# Patient Record
Sex: Female | Born: 1950
Health system: Southern US, Community
[De-identification: ages and names within clinical notes are randomized; demographics above are authoritative.]

## PROBLEM LIST (undated history)

## (undated) DIAGNOSIS — I10 Essential (primary) hypertension: Secondary | ICD-10-CM

## (undated) DIAGNOSIS — R7989 Other specified abnormal findings of blood chemistry: Secondary | ICD-10-CM

## (undated) DIAGNOSIS — F32A Depression, unspecified: Secondary | ICD-10-CM

## (undated) DIAGNOSIS — K589 Irritable bowel syndrome without diarrhea: Secondary | ICD-10-CM

## (undated) DIAGNOSIS — T8859XA Other complications of anesthesia, initial encounter: Secondary | ICD-10-CM

## (undated) DIAGNOSIS — K224 Dyskinesia of esophagus: Secondary | ICD-10-CM

## (undated) DIAGNOSIS — R945 Abnormal results of liver function studies: Secondary | ICD-10-CM

## (undated) DIAGNOSIS — M1990 Unspecified osteoarthritis, unspecified site: Secondary | ICD-10-CM

## (undated) DIAGNOSIS — R519 Headache, unspecified: Secondary | ICD-10-CM

## (undated) DIAGNOSIS — E785 Hyperlipidemia, unspecified: Secondary | ICD-10-CM

## (undated) DIAGNOSIS — F329 Major depressive disorder, single episode, unspecified: Secondary | ICD-10-CM

## (undated) DIAGNOSIS — R7303 Prediabetes: Secondary | ICD-10-CM

## (undated) DIAGNOSIS — R112 Nausea with vomiting, unspecified: Secondary | ICD-10-CM

## (undated) DIAGNOSIS — E781 Pure hyperglyceridemia: Secondary | ICD-10-CM

## (undated) DIAGNOSIS — C801 Malignant (primary) neoplasm, unspecified: Secondary | ICD-10-CM

## (undated) DIAGNOSIS — G479 Sleep disorder, unspecified: Secondary | ICD-10-CM

## (undated) DIAGNOSIS — Z9889 Other specified postprocedural states: Secondary | ICD-10-CM

## (undated) DIAGNOSIS — T4145XA Adverse effect of unspecified anesthetic, initial encounter: Secondary | ICD-10-CM

## (undated) DIAGNOSIS — R51 Headache: Secondary | ICD-10-CM

## (undated) DIAGNOSIS — K5792 Diverticulitis of intestine, part unspecified, without perforation or abscess without bleeding: Secondary | ICD-10-CM

## (undated) HISTORY — PX: SHOULDER SURGERY: SHX246

## (undated) HISTORY — PX: CARDIOVASCULAR STRESS TEST: SHX262

## (undated) HISTORY — PX: OTHER SURGICAL HISTORY: SHX169

## (undated) HISTORY — DX: Irritable bowel syndrome, unspecified: K58.9

## (undated) HISTORY — DX: Other specified abnormal findings of blood chemistry: R79.89

## (undated) HISTORY — DX: Sleep disorder, unspecified: G47.9

## (undated) HISTORY — DX: Pure hyperglyceridemia: E78.1

## (undated) HISTORY — DX: Hyperlipidemia, unspecified: E78.5

## (undated) HISTORY — PX: BREAST REDUCTION SURGERY: SHX8

## (undated) HISTORY — PX: TONSILLECTOMY: SUR1361

## (undated) HISTORY — DX: Abnormal results of liver function studies: R94.5

---

## 1995-08-14 HISTORY — PX: REDUCTION MAMMAPLASTY: SUR839

## 1998-08-18 ENCOUNTER — Other Ambulatory Visit: Admission: RE | Admit: 1998-08-18 | Discharge: 1998-08-18 | Payer: Self-pay | Admitting: Obstetrics and Gynecology

## 1999-08-01 ENCOUNTER — Encounter: Payer: Self-pay | Admitting: *Deleted

## 1999-08-01 ENCOUNTER — Encounter: Admission: RE | Admit: 1999-08-01 | Discharge: 1999-08-01 | Payer: Self-pay | Admitting: *Deleted

## 2000-02-21 ENCOUNTER — Encounter: Admission: RE | Admit: 2000-02-21 | Discharge: 2000-02-21 | Payer: Self-pay | Admitting: *Deleted

## 2000-02-21 ENCOUNTER — Encounter: Payer: Self-pay | Admitting: *Deleted

## 2000-10-07 ENCOUNTER — Encounter: Payer: Self-pay | Admitting: *Deleted

## 2000-10-07 ENCOUNTER — Encounter: Admission: RE | Admit: 2000-10-07 | Discharge: 2000-10-07 | Payer: Self-pay | Admitting: *Deleted

## 2000-10-10 ENCOUNTER — Encounter: Admission: RE | Admit: 2000-10-10 | Discharge: 2000-10-10 | Payer: Self-pay | Admitting: *Deleted

## 2000-10-10 ENCOUNTER — Encounter: Payer: Self-pay | Admitting: *Deleted

## 2001-05-20 ENCOUNTER — Other Ambulatory Visit: Admission: RE | Admit: 2001-05-20 | Discharge: 2001-05-20 | Payer: Self-pay | Admitting: Obstetrics and Gynecology

## 2001-12-17 ENCOUNTER — Ambulatory Visit (HOSPITAL_COMMUNITY): Admission: RE | Admit: 2001-12-17 | Discharge: 2001-12-17 | Payer: Self-pay | Admitting: Gastroenterology

## 2002-08-13 HISTORY — PX: KNEE ARTHROSCOPY: SHX127

## 2002-09-12 ENCOUNTER — Emergency Department (HOSPITAL_COMMUNITY): Admission: EM | Admit: 2002-09-12 | Discharge: 2002-09-12 | Payer: Self-pay | Admitting: *Deleted

## 2002-09-12 ENCOUNTER — Encounter: Payer: Self-pay | Admitting: Emergency Medicine

## 2002-10-16 ENCOUNTER — Emergency Department (HOSPITAL_COMMUNITY): Admission: EM | Admit: 2002-10-16 | Discharge: 2002-10-17 | Payer: Self-pay | Admitting: Emergency Medicine

## 2002-10-17 ENCOUNTER — Encounter: Payer: Self-pay | Admitting: Emergency Medicine

## 2002-12-04 ENCOUNTER — Encounter: Admission: RE | Admit: 2002-12-04 | Discharge: 2002-12-04 | Payer: Self-pay

## 2004-04-10 ENCOUNTER — Emergency Department (HOSPITAL_COMMUNITY): Admission: EM | Admit: 2004-04-10 | Discharge: 2004-04-10 | Payer: Self-pay | Admitting: *Deleted

## 2004-06-22 ENCOUNTER — Other Ambulatory Visit: Admission: RE | Admit: 2004-06-22 | Discharge: 2004-06-22 | Payer: Self-pay | Admitting: Obstetrics and Gynecology

## 2005-11-01 ENCOUNTER — Other Ambulatory Visit: Admission: RE | Admit: 2005-11-01 | Discharge: 2005-11-01 | Payer: Self-pay | Admitting: Obstetrics and Gynecology

## 2007-10-15 ENCOUNTER — Emergency Department (HOSPITAL_COMMUNITY): Admission: EM | Admit: 2007-10-15 | Discharge: 2007-10-15 | Payer: Self-pay | Admitting: Emergency Medicine

## 2007-11-24 ENCOUNTER — Ambulatory Visit (HOSPITAL_COMMUNITY): Admission: RE | Admit: 2007-11-24 | Discharge: 2007-11-24 | Payer: Self-pay | Admitting: Family Medicine

## 2010-09-03 ENCOUNTER — Encounter: Payer: Self-pay | Admitting: *Deleted

## 2010-09-03 ENCOUNTER — Encounter: Payer: Self-pay | Admitting: Gastroenterology

## 2010-12-18 ENCOUNTER — Emergency Department (HOSPITAL_COMMUNITY): Payer: BC Managed Care – PPO

## 2010-12-18 ENCOUNTER — Other Ambulatory Visit: Payer: Self-pay | Admitting: Gastroenterology

## 2010-12-18 ENCOUNTER — Inpatient Hospital Stay (HOSPITAL_COMMUNITY)
Admission: EM | Admit: 2010-12-18 | Discharge: 2010-12-20 | DRG: 813 | Disposition: A | Discharge status: Home / Self Care | Payer: BC Managed Care – PPO | Source: Ambulatory Visit | Attending: Internal Medicine | Admitting: Internal Medicine

## 2010-12-18 ENCOUNTER — Other Ambulatory Visit: Payer: Self-pay

## 2010-12-18 DIAGNOSIS — I10 Essential (primary) hypertension: Secondary | ICD-10-CM | POA: Diagnosis present

## 2010-12-18 DIAGNOSIS — K560 Paralytic ileus: Secondary | ICD-10-CM | POA: Diagnosis present

## 2010-12-18 DIAGNOSIS — K5289 Other specified noninfective gastroenteritis and colitis: Principal | ICD-10-CM | POA: Diagnosis present

## 2010-12-18 DIAGNOSIS — E860 Dehydration: Secondary | ICD-10-CM | POA: Diagnosis present

## 2010-12-18 DIAGNOSIS — K219 Gastro-esophageal reflux disease without esophagitis: Secondary | ICD-10-CM | POA: Diagnosis present

## 2010-12-18 DIAGNOSIS — E7800 Pure hypercholesterolemia, unspecified: Secondary | ICD-10-CM | POA: Diagnosis present

## 2010-12-18 LAB — DIFFERENTIAL
Basophils Absolute: 0 10*3/uL (ref 0.0–0.1)
Basophils Relative: 0 % (ref 0–1)
Eosinophils Absolute: 0 10*3/uL (ref 0.0–0.7)
Eosinophils Relative: 0 % (ref 0–5)
Lymphocytes Relative: 11 % — ABNORMAL LOW (ref 12–46)
Lymphs Abs: 1.2 10*3/uL (ref 0.7–4.0)
Monocytes Absolute: 0.3 10*3/uL (ref 0.1–1.0)
Monocytes Relative: 3 % (ref 3–12)
Neutro Abs: 8.8 10*3/uL — ABNORMAL HIGH (ref 1.7–7.7)
Neutrophils Relative %: 85 % — ABNORMAL HIGH (ref 43–77)

## 2010-12-18 LAB — BASIC METABOLIC PANEL
CO2: 23 mEq/L (ref 19–32)
Calcium: 9.2 mg/dL (ref 8.4–10.5)
Creatinine, Ser: 0.65 mg/dL (ref 0.4–1.2)
GFR calc Af Amer: >60 mL/min (ref >60)
GFR calc non Af Amer: >60 mL/min (ref >60)

## 2010-12-18 LAB — HEPATIC FUNCTION PANEL
Albumin: 4.2 g/dL (ref 3.5–5.2)
Total Bilirubin: 0.4 mg/dL (ref 0.3–1.2)
Total Protein: 7.7 g/dL (ref 6.0–8.3)

## 2010-12-18 LAB — CBC
MCH: 31.5 pg (ref 26.0–34.0)
MCHC: 35.6 g/dL (ref 30.0–36.0)
Platelets: 262 10*3/uL (ref 150–400)
RDW: 12.4 % (ref 11.5–15.5)

## 2010-12-18 LAB — LIPASE, BLOOD: Lipase: 28 U/L (ref 11–59)

## 2010-12-18 MED ORDER — IOHEXOL 300 MG/ML  SOLN
100.0000 mL | Freq: Once | INTRAMUSCULAR | Status: AC | PRN
  Administered 2010-12-18: 100 mL via INTRAVENOUS

## 2010-12-19 ENCOUNTER — Inpatient Hospital Stay (HOSPITAL_COMMUNITY): Payer: BC Managed Care – PPO

## 2010-12-19 LAB — DIFFERENTIAL
Basophils Relative: 0 % (ref 0–1)
Eosinophils Absolute: 0 10*3/uL (ref 0.0–0.7)
Lymphs Abs: 1.4 10*3/uL (ref 0.7–4.0)
Neutro Abs: 4.2 10*3/uL (ref 1.7–7.7)
Neutrophils Relative %: 69 % (ref 43–77)

## 2010-12-19 LAB — CBC
MCV: 88.4 fL (ref 78.0–100.0)
Platelets: 252 10*3/uL (ref 150–400)
RBC: 4.3 MIL/uL (ref 3.87–5.11)
WBC: 6.1 10*3/uL (ref 4.0–10.5)

## 2010-12-19 LAB — SEDIMENTATION RATE: Sed Rate: 22 mm/hr (ref 0–22)

## 2010-12-19 NOTE — H&P (Signed)
[name], KEAST              ACCOUNT NO.:  [id number]  MEDICAL RECORD NO.:  [id number]           PATIENT TYPE:  E  LOCATION:  [facility]                         FACILITY:  [facility]  PHYSICIAN:  Talmage Nap, MD  DATE OF BIRTH:  1951-06-24  DATE OF ADMISSION:  12/18/2010 DATE OF DISCHARGE:                             HISTORY & PHYSICAL   PRIMARY CARE PHYSICIAN:  Unknown.  PRIMARY GASTROENTEROLOGIST:  Shirley Friar, MD  SOURCES OF INFORMATION:  History obtainable from the patient and the patient's spouse.  CHIEF COMPLAINT:  Left lower quadrant abdominal pain of about 2 days' duration.  HISTORY OF PRESENT ILLNESS:  The patient is a 60 year old Caucasian female with a history of hypertriglyceridemia on gemfibrozil but has not taken medication for 2-3 days because of abdominal pain as well as GERD presenting to the emergency room with pain in the left lower quadrant of about 2 days' duration.  The patient claimed that she had been in stable health until about 2 days prior to presenting to the emergency room. She developed pain in the left lower quadrant which she describes as "colicky" 10/10 intensity and was nonradiating.  This was said to be associated with multiple episodes of nausea and the patient had one episode of vomiting.  She denied any diarrhea.  She denied any fever. She denied any chills.  She denied any rigor.  The pain was so intense that the patient was not able to eat and subsequently had to come to the emergency room.  She also denies any history of dysuria or hematuria.  PAST MEDICAL HISTORY: 1. Positive for hypertriglyceridemia, on gemfibrozil. 2. History of diverticulosis. 3. GERD. 4. Hypertension, presently not on any medications.  PAST SURGICAL HISTORY:  Cesarean section and breast reduction.  PREADMISSION MEDICATIONS:  Prilosec as well as gemfibrozil, dosage unknown.  ALLERGIES: 1. KEFLEX. 2. TETRACYCLINE.  SOCIAL HISTORY:  Negative for  alcohol or tobacco use.  FAMILY HISTORY:  Positive for colon CA.  REVIEW OF SYSTEMS:  The patient denies any history of headaches.  No blurred vision.  Complained about nausea, had one episodes of vomiting. Denies any chest pain.  No shortness of breath.  No cough.  No PND or orthopnea.  Complained about pain in the left lower quadrant with associated multiple episodes of nausea and had one episode of vomiting. She denies any diarrhea or hematochezia.  No dysuria or hematuria.  No swelling of the lower extremity.  No intolerance to heat or cold and no neuropsychiatric disorder.  PHYSICAL EXAMINATION:  GENERAL:  A middle-aged lady, severely dehydrated, not in any obvious respiratory distress. VITAL SIGNS:  Blood pressure is 138/72, pulse is 69, respiratory rate 20, and temperature is 90.1. HEENT:  Pupils are reactive to light and extraocular muscles are intact. NECK:  No jugular venous distention.  No carotid bruit.  No lymphadenopathy. CHEST:  Clear to auscultation. HEART:  Heart sounds are one and two. ABDOMEN:  Soft with tenderness in the left lower quadrant.  No guarding. No rigidity.  Liver, spleen, and kidney are nonpalpable.  Bowel sounds are hypoactive. EXTREMITIES:  No pedal edema. NEUROLOGIC:  Nonfocal.  MUSCULOSKELETAL:  Unremarkable. SKIN:  Markedly decreased turgor.  LABORATORY DATA:  Initial lipase was 28.  LFTs showed total protein 7.7, albumin is 4.2, AST 27, ALT 28, and alkaline phosphatase 70.  Chemistry showed sodium of 134, potassium of 4.4, chloride of 100 with a bicarb of 23, glucose is 120, BUN is 14, and creatinine is 0.65.  Hematological indices showed WBC of 10.4, hemoglobin of 15.8, hematocrit of 44.4, MCV of 88.4 with a platelet count of 262, neutrophils 85% elevated, and absolute granulocyte count is 8.8, elevated.  IMAGING STUDIES:  CT of the abdomen and pelvis with contrast which showed nonspecific small-bowel enteritis and ileus.  This is  nonspecific but may be infectious in etiology.  There is diverticulosis.  There is no radiographic evidence of diverticulitis.  There is hepatic steatosis, a small benign hepatic hemangioma noted, and small hiatal hernia.  ADMITTING IMPRESSION: 1. Left lower quadrant pain, most likely secondary to diverticulitis. 2. Dehydration. 3. Gastroesophageal reflux disease. 4. Hypertriglyceridemia.  PLAN:  Admit the patient to General Medical Floor.  The patient will be n.p.o. for now.  She will be adequately rehydrated with normal saline IV to go at rate of 150 mL an hour.  Pain control will be with Dilaudid 2 mg IV q.4 p.r.n.  She will also be on Cipro 400 mg IV q. 12., Flagyl 500 mg IV q.8 hourly.  GI prophylaxis with Protonix 40 mg IV q. 24 and DVT prophylaxis with TED stockings or SCD boots.  Further labs to be ordered on this patient will include blood culture x2 before starting IV antibiotics. CBC, CMP, and magnesium will be repeated in a.m.  I was informed by the nurse practitioner that the gastroenterologist, Dr. Dulce Sellar is very much aware of this patient.  The patient will be followed and evaluated on a day-to- day basis.     Talmage Nap, MD     CN/MEDQ  D:  12/18/2010  T:  12/18/2010  Job:  [id number]  Electronically Signed by Talmage Nap  on 12/19/2010 12:06:35 PM

## 2010-12-20 ENCOUNTER — Inpatient Hospital Stay (HOSPITAL_COMMUNITY): Payer: BC Managed Care – PPO

## 2010-12-20 LAB — COMPREHENSIVE METABOLIC PANEL
ALT: 25 U/L (ref 0–35)
AST: 25 U/L (ref 0–37)
Albumin: 3.3 g/dL — ABNORMAL LOW (ref 3.5–5.2)
Alkaline Phosphatase: 51 U/L (ref 39–117)
BUN: 11 mg/dL (ref 6–23)
CO2: 28 mEq/L (ref 19–32)
Calcium: 8.6 mg/dL (ref 8.4–10.5)
Chloride: 107 mEq/L (ref 96–112)
Creatinine, Ser: 0.8 mg/dL (ref 0.4–1.2)
GFR calc Af Amer: >60 mL/min (ref >60)
GFR calc non Af Amer: >60 mL/min (ref >60)
Glucose, Bld: 108 mg/dL — ABNORMAL HIGH (ref 70–99)
Potassium: 4.2 mEq/L (ref 3.5–5.1)
Sodium: 140 mEq/L (ref 135–145)
Total Bilirubin: 0.2 mg/dL — ABNORMAL LOW (ref 0.3–1.2)
Total Protein: 6.3 g/dL (ref 6.0–8.3)

## 2010-12-20 LAB — CBC
Hemoglobin: 12.5 g/dL (ref 12.0–15.0)
RBC: 4.14 MIL/uL (ref 3.87–5.11)

## 2010-12-22 NOTE — Consult Note (Signed)
[name], [name]              ACCOUNT NO.:  [id number]  MEDICAL RECORD NO.:  [id number]           PATIENT TYPE:  I  LOCATION:  5149                         FACILITY:  [facility]  PHYSICIAN:  Bernette Redbird, M.D.   DATE OF BIRTH:  11-Jul-1951  DATE OF CONSULTATION:  12/19/2010 DATE OF DISCHARGE:                                CONSULTATION   Dr. Beverly Gust of the [facility] asked [place] to see this 60 year old female because of severe abdominal pain.  The patient was admitted to the hospital yesterday following a 2-day prodrome of left lower quadrant pain which began Saturday evening and intensified and was associated with some degree of vomiting.  She is known to my partner, Dr. Vida Rigger, who has done several colonoscopies on her, most recently in 2008, showing diverticular disease.  The patient underwent a CT scan of the abdomen which did not show any evidence of diverticulitis but did show evidence of small bowel hyperemia and mesenteric edema, consistent with enteritis.  Since admission to the hospital, she has had waxing and waning abdominal pain.  She has gotten pretty good control with the pain medication although her pain has been of variable intensity since this problem began and, when I initially came by to see her for examination, she was intermittently smiling and alternately riving in abdominal pain and looking very much in distress.  When I came by later, she was reading the newspaper, sitting up in bed, in absolutely no distress with a smile on her face and indicating she had been able to consume and keep down with good tolerance, clear liquids.  PAST MEDICAL HISTORY:  Allergies:  To KEFLEX and TETRACYCLINE. Operations:  C-section. Medical Illnesses:  Diverticular disease, GERD, hypertriglyceridemia, for which she was started on gemfibrozil about 6 weeks ago, history of hypertension. Habits:  Nonsmoker, nondrinker.  FAMILY HISTORY:  Colon cancer in her  father in his 36s, otherwise negative for GI illnesses such as gallbladder trouble, liver disease, or inflammatory bowel disease.  SOCIAL HISTORY:  Married, husband at bedside.  REVIEW OF SYSTEMS:  See HPI.  Occasional heartburn, for which she uses Prevacid on pretty much a daily basis.  Occasional constipation for which she uses MiraLax p.r.n.  No anorexia, weight loss, dysphagia, chronic abdominal pain, rectal bleeding.  PHYSICAL EXAMINATION:  GENERAL:  A well-preserved female who is in intermittent distress. CHEST:  Clear. HEART:  Normal. HEENT:  No pallor or icterus. ABDOMEN:  Has active bowel sounds.  No bruits, no organomegaly, or guarding.  No masses or objective tenderness.  There is subjective but not really reproducible tenderness in the mid left lower abdominal region.  LABS:  White count on admission was 10,400 with 85 polys and 11 lymphs. On repeat, the white count dropped to 6100 with 69 polys and 23 lymphs, hemoglobin after hydration is 12.9 (on admission it was 15.8). Chemistry panel on admission unremarkable including normal liver chemistries and lipase.  CT scan, diverticulosis without radiographic evidence of diverticulitis. Small-bowel hyperenhancement with mesenteric edema, suggestive of enteritis, no bowel obstruction.  Plain abdominal films today reviewed, unrevealing.  IMPRESSION:  Nonspecific enteritis.  This  did occur after she ate out at a restaurant, eating scallops and beef in [employer].  However, I tend to doubt that this is food poisoning because of the relative absence of nausea and vomiting or diarrhea.  I doubt ischemia based on review of the CT scan with radiologist.  I imagine this will end up being some sort of nonspecific enteritis.  RECOMMENDATIONS:  For now, since the patient is clearly improving both in terms of her symptoms and her white count, I would favor observation with continued antibiotic therapy.  I do not think she  will need a full course of antibiotics as long as she has a prompt response.  We will see how her symptoms does before we advance her diet.  We appreciate the opportunity to have seen this patient in consultation with you.          ______________________________ Bernette Redbird, M.D.     RB/MEDQ  D:  12/19/2010  T:  12/20/2010  Job:  [id number]  cc:   Pam Drown, M.D.  Electronically Signed by Bernette Redbird M.D. on 12/22/2010 10:14:54 AM

## 2010-12-25 LAB — CULTURE, BLOOD (ROUTINE X 2): Culture  Setup Time: 201205080836

## 2010-12-27 NOTE — Discharge Summary (Signed)
[name], [name]              ACCOUNT NO.:  [id number]  MEDICAL RECORD NO.:  [id number]           PATIENT TYPE:  I  LOCATION:  5149                         FACILITY:  [facility]  PHYSICIAN:  Rock Nephew, MD       DATE OF BIRTH:  1950/12/01  DATE OF ADMISSION:  12/18/2010 DATE OF DISCHARGE:  12/20/2010                        DISCHARGE SUMMARY - REFERRING   PRIMARY CARE PHYSICIAN:  Pam Drown, MD  Dr. Matthias Hughs was consulting on the case.  DISCHARGE DIAGNOSES: 1. Enteritis. 2. Hypertension, presently off meds. 3. Hypertriglyceridemia. 4. Gastroesophageal reflux disease.  DISCHARGE MEDICATIONS:  For the patient are as follows; 1. Ciprofloxacin 500 mg twice daily 5 days. 2. Vicodin 1 tablet by mouth every 4 hours as needed for pain. 3. Metronidazole 5 mg by mouth three times a day. 4. Gemfibrozil 600 mg p.o. daily. 5. Glucosamine chondroitin 4 tablets by mouth daily. 6. Lunesta 2 mg 1 tablet by mouth daily at bedtime as needed. 7. Multivitamins 2 tablets by mouth daily. 8. Os-Cal 2 tablets by mouth daily. 9. Prevacid 15 mg by mouth daily. 10.Vitamin D3 50,000 units 1 capsule by mouth each week.  DISPOSITION:  The patient is discharged home.  The patient's diet should be heart-healthy, low fat.  PROCEDURES PERFORMED: 1. The patient had a CT scan of the abdomen pelvis on Dec 18, 2010     which showed nonspecific small bowel enteritis and ileus.  This is     nonspecific, but may be infectious in etiology. 2. Diverticulosis.  No radiographic evidence of diverticulitis. 3. Hepatic steatosis and small benign hepatic hematoma, hemangioma     noted. 4. Small hiatal hernia.  The patient also had an acute abdominal     series, the last one on Dec 19, 2010 showed nonspecific bowel gas     pattern, slightly prominent, upper abdominal small loop likely     related to enteritis on prior CT.  CONSULTATIONS ON THIS CASE:  Dr. Matthias Hughs, [facility].  DIET:  Again are heart healthy  low-fat.  FOLLOW-UP:  The patient should follow up with her primary care physician Dr. Gweneth Dimitri in about 1 week.  The patient should follow up with either Dr. Vida Rigger, Dr. Matthias Hughs or Dr. Charlott Rakes as needed.  BRIEF HISTORY OF PRESENT ILLNESS AND CHIEF COMPLAINT:  Left lower quadrant abdominal pain of 2 days duration.  This is a 60 year old female with history of multiple medical problems who came to the hospital for about 2 to 3 days of abdominal pain.  The patient came to the hospital, had a CT scan of the abdomen and pelvis, which showed enteritis.  HOSPITAL COURSE: 1. Enteritis and ileus.  The patient was admitted to the hospital.     The patient received IV antibiotics.  The patient was seen in     consultation with [facility].  The etiology was not clear.  The     patient was improving.  The patient's diet was slowly advanced.     The patient was able to tolerate a full diet on Dec 20, 2010 without     any difficulty and  the patient was deemed ready for discharge.  We     will treat the patient empirically for ciprofloxacin and Flagyl     empirically for another 5 days to complete a 7-day course.  The     patient will follow up with [facility] as needed.  However, if the     patient improves, the patient can have routine follow-up. 2. Hypertension.  The patient has a history of hypertension.  However,     she is off antihypertensives.  Her blood pressure has ranged from     147 systolic to 135 systolic, diastolic has ranged from 80 to 76. 3. Hypertriglyceridemia.  The patient takes gemfibrozil at home.  The     patient's gemfibrozil was withheld during the hospitalization,     which can be started again as an outpatient. 4. Gastroesophageal reflux disease.  The patient takes a PPI at home.     The patient was given a PPI during the hospitalization.     Rock Nephew, MD     NH/MEDQ  D:  12/20/2010  T:  12/20/2010  Job:  [id number]  cc:   Pam Drown,  M.D. Shirley Friar, MD Petra Kuba, M.D. Bernette Redbird, M.D.  Electronically Signed by Rock Nephew MD on 12/27/2010 05:45:23 PM

## 2010-12-29 NOTE — Op Note (Signed)
[facility]  Patient:    [name], [name] Visit Number: [id number] MRN: [id number]          Service Type: END Location: ENDO Attending Physician:  Nelda Marseille Dictated by:   Petra Kuba, M.D. Admit Date:  12/17/2001   CC:         Heather Roberts, M.D.  Debroah Loop, M.D.   Operative Report  PROCEDURE:  Colonoscopy  INDICATION:  Family history of colon cancer; due for repeat screening. Consent was signed after risks, benefits, methods, and options were thoroughly discussed in the office.  MEDICINES USED:  Demerol 90, Versed 9.  PROCEDURE:  Rectal inspection pertinent for _______internal hemorrhoids, small. Digital examination is negative. Pediatric video adjustable colonoscope was inserted and easily advanced around the colon to the cecum. No abdominal pressure or position changes were needed. No obvious abnormalities were seen on insertion, but some left-sided diverticula. The cecum was identified by the appendiceal orifice and the ileocecal valve. _____ was inserted ___ within the terminal ileum which was normal. Further documentation was obtained. The scope was slowly withdrawn. The prep was adequate. There was some liquid stool that required washing and suctioning. It was slowly withdrawn through the colon. No abnormalities were seen, but some diverticula in the sigmoid. There was some tortuosity and when we felt back around a loop, we did try to readvance her on the _________ of decreased chance of missing things. Scope was slowly withdrawn back to the rectum. No other abnormalities were seen. Once back in the rectum, the scope was retroflexed. It was pertinent for some internal hemorrhoids. Scope was turned and readvanced _______ up the left side of the colon. Air was suctioned and scope was removed. The patient tolerated the procedure well. There were no obvious immediate complications.  ENDOSCOPIC DIAGNOSES: 1.  Internal/external hemorrhoids. 2. Left-sided diverticula. 3. Tortuous colon. 4. Otherwise within normal limits to the terminal ileum.  PLAN:  I really want her to stop all herbs, vitamins, nutritional supplements, possibly except for her glucosamine for a month and recheck liver tests at that time. We have discussed that multiple times. We discussed it prior to her colonoscopy. She will continue to think about it. I would not want to do a liver biopsy just to prove this was medicine induced, although if liver tests stay abnormal and all other laboratory parameters do not reveal a ___ that is what she will need. As far as her colon goes, yearly rectals and guaiacs, per Dr. Orson Aloe. Have to see her back p.r.n. and otherwise repeat screening in 5 years. We will go ahead and get a set of liver tests today and 1 month after she stops her supplements to repeat. Dictated by:   Petra Kuba, M.D. Attending Physician:  Nelda Marseille DD:  12/17/01 TD:  12/19/01 Job: [id number]

## 2011-05-07 LAB — DIFFERENTIAL
Basophils Absolute: 0
Eosinophils Absolute: 0.1
Eosinophils Relative: 2
Lymphocytes Relative: 43
Lymphs Abs: 2.2
Monocytes Absolute: 0.3
Neutro Abs: 2.6
Neutrophils Relative %: 50

## 2011-05-07 LAB — CBC
MCHC: 35.6
RBC: 4.48
WBC: 5.2

## 2011-05-07 LAB — BASIC METABOLIC PANEL
CO2: 26
Calcium: 9.4
Creatinine, Ser: 0.93
GFR calc Af Amer: >60

## 2011-05-07 LAB — POCT CARDIAC MARKERS
CKMB, poc: <1 — ABNORMAL LOW
Operator id: 3067
Operator id: 4708
Troponin i, poc: <0.05
Troponin i, poc: <0.05

## 2013-09-17 ENCOUNTER — Emergency Department (HOSPITAL_COMMUNITY): Payer: No Typology Code available for payment source

## 2013-09-17 ENCOUNTER — Encounter (HOSPITAL_COMMUNITY): Payer: Self-pay | Admitting: Emergency Medicine

## 2013-09-17 ENCOUNTER — Emergency Department (HOSPITAL_COMMUNITY)
Admission: EM | Admit: 2013-09-17 | Discharge: 2013-09-17 | Disposition: A | Discharge status: Home / Self Care | Payer: No Typology Code available for payment source | Attending: Emergency Medicine | Admitting: Emergency Medicine

## 2013-09-17 DIAGNOSIS — M549 Dorsalgia, unspecified: Secondary | ICD-10-CM | POA: Insufficient documentation

## 2013-09-17 DIAGNOSIS — Z79899 Other long term (current) drug therapy: Secondary | ICD-10-CM | POA: Insufficient documentation

## 2013-09-17 DIAGNOSIS — R0789 Other chest pain: Secondary | ICD-10-CM | POA: Insufficient documentation

## 2013-09-17 DIAGNOSIS — I10 Essential (primary) hypertension: Secondary | ICD-10-CM | POA: Insufficient documentation

## 2013-09-17 HISTORY — DX: Essential (primary) hypertension: I10

## 2013-09-17 LAB — CBC
HEMATOCRIT: 44 % (ref 36.0–46.0)
HEMOGLOBIN: 15.7 g/dL — AB (ref 12.0–15.0)
MCH: 31.8 pg (ref 26.0–34.0)
MCHC: 35.7 g/dL (ref 30.0–36.0)
MCV: 89.1 fL (ref 78.0–100.0)
Platelets: 278 10*3/uL (ref 150–400)
RBC: 4.94 MIL/uL (ref 3.87–5.11)
RDW: 12.5 % (ref 11.5–15.5)
WBC: 5.9 10*3/uL (ref 4.0–10.5)

## 2013-09-17 LAB — POCT I-STAT TROPONIN I
TROPONIN I, POC: 0 ng/mL (ref 0.00–0.08)
Troponin i, poc: 0 ng/mL (ref 0.00–0.08)

## 2013-09-17 LAB — HEPATIC FUNCTION PANEL
ALBUMIN: 4 g/dL (ref 3.5–5.2)
ALT: 80 U/L — AB (ref 0–35)
AST: 46 U/L — AB (ref 0–37)
Alkaline Phosphatase: 113 U/L (ref 39–117)
Total Bilirubin: 0.4 mg/dL (ref 0.3–1.2)
Total Protein: 7.7 g/dL (ref 6.0–8.3)

## 2013-09-17 LAB — BASIC METABOLIC PANEL
BUN: 13 mg/dL (ref 6–23)
CHLORIDE: 102 meq/L (ref 96–112)
CO2: 26 meq/L (ref 19–32)
Calcium: 9.5 mg/dL (ref 8.4–10.5)
Creatinine, Ser: 0.99 mg/dL (ref 0.50–1.10)
GFR calc Af Amer: 68 mL/min — ABNORMAL LOW (ref >90)
GFR calc non Af Amer: 59 mL/min — ABNORMAL LOW (ref >90)
GLUCOSE: 134 mg/dL — AB (ref 70–99)
POTASSIUM: 4.3 meq/L (ref 3.7–5.3)
Sodium: 141 mEq/L (ref 137–147)

## 2013-09-17 LAB — D-DIMER, QUANTITATIVE (NOT AT ARMC): D DIMER QUANT: 0.57 ug{FEU}/mL — AB (ref 0.00–0.48)

## 2013-09-17 LAB — LIPASE, BLOOD: Lipase: 41 U/L (ref 11–59)

## 2013-09-17 MED ORDER — ASPIRIN 81 MG PO CHEW
324.0000 mg | CHEWABLE_TABLET | Freq: Once | ORAL | Status: AC
  Administered 2013-09-17: 324 mg via ORAL
  Filled 2013-09-17: qty 4

## 2013-09-17 MED ORDER — IOHEXOL 350 MG/ML SOLN
100.0000 mL | Freq: Once | INTRAVENOUS | Status: AC | PRN
  Administered 2013-09-17: 100 mL via INTRAVENOUS

## 2013-09-17 MED ORDER — NAPROXEN 500 MG PO TABS: 500.0000 mg | ORAL_TABLET | Freq: Two times a day (BID) | ORAL | Status: DC

## 2013-09-17 NOTE — Discharge Instructions (Signed)
Chest Pain (Nonspecific) Follow up with your doctor for management of your blood pressure and a stress test. Return to the ED if you develop new or worsening symptoms. It is often hard to give a specific diagnosis for the cause of chest pain. There is always a chance that your pain could be related to something serious, such as a heart attack or a blood clot in the lungs. You need to follow up with your caregiver for further evaluation. CAUSES   Heartburn.  Pneumonia or bronchitis.  Anxiety or stress.  Inflammation around your heart (pericarditis) or lung (pleuritis or pleurisy).  A blood clot in the lung.  A collapsed lung (pneumothorax). It can develop suddenly on its own (spontaneous pneumothorax) or from injury (trauma) to the chest.  Shingles infection (herpes zoster virus). The chest wall is composed of bones, muscles, and cartilage. Any of these can be the source of the pain.  The bones can be bruised by injury.  The muscles or cartilage can be strained by coughing or overwork.  The cartilage can be affected by inflammation and become sore (costochondritis). DIAGNOSIS  Lab tests or other studies, such as X-rays, electrocardiography, stress testing, or cardiac imaging, may be needed to find the cause of your pain.  TREATMENT   Treatment depends on what may be causing your chest pain. Treatment may include:  Acid blockers for heartburn.  Anti-inflammatory medicine.  Pain medicine for inflammatory conditions.  Antibiotics if an infection is present.  You may be advised to change lifestyle habits. This includes stopping smoking and avoiding alcohol, caffeine, and chocolate.  You may be advised to keep your head raised (elevated) when sleeping. This reduces the chance of acid going backward from your stomach into your esophagus.  Most of the time, nonspecific chest pain will improve within 2 to 3 days with rest and mild pain medicine. HOME CARE INSTRUCTIONS   If  antibiotics were prescribed, take your antibiotics as directed. Finish them even if you start to feel better.  For the next few days, avoid physical activities that bring on chest pain. Continue physical activities as directed.  Do not smoke.  Avoid drinking alcohol.  Only take over-the-counter or prescription medicine for pain, discomfort, or fever as directed by your caregiver.  Follow your caregiver's suggestions for further testing if your chest pain does not go away.  Keep any follow-up appointments you made. If you do not go to an appointment, you could develop lasting (chronic) problems with pain. If there is any problem keeping an appointment, you must call to reschedule. SEEK MEDICAL CARE IF:   You think you are having problems from the medicine you are taking. Read your medicine instructions carefully.  Your chest pain does not go away, even after treatment.  You develop a rash with blisters on your chest. SEEK IMMEDIATE MEDICAL CARE IF:   You have increased chest pain or pain that spreads to your arm, neck, jaw, back, or abdomen.  You develop shortness of breath, an increasing cough, or you are coughing up blood.  You have severe back or abdominal pain, feel nauseous, or vomit.  You develop severe weakness, fainting, or chills.  You have a fever. THIS IS AN EMERGENCY. Do not wait to see if the pain will go away. Get medical help at once. Call your local emergency services (911 in [place] Do not drive yourself to the hospital. MAKE SURE YOU:   Understand these instructions.  Will watch your condition.  Will get  help right away if you are not doing well or get worse. Document Released: 05/09/2005 Document Revised: 10/22/2011 Document Reviewed: 03/04/2008 [facility] Patient Information 2014 [place].  Arterial Hypertension Arterial hypertension (high blood pressure) is a condition of elevated pressure in your blood vessels. Hypertension over a long period of  time is a risk factor for strokes, heart attacks, and heart failure. It is also the leading cause of kidney (renal) failure.  CAUSES   In Adults -- Over 90% of all hypertension has no known cause. This is called essential or primary hypertension. In the other 10% of people with hypertension, the increase in blood pressure is caused by another disorder. This is called secondary hypertension. Important causes of secondary hypertension are:  Heavy alcohol use.  Obstructive sleep apnea.  Hyperaldosterosim (Conn's syndrome).  Steroid use.  Chronic kidney failure.  Hyperparathyroidism.  Medications.  Renal artery stenosis.  Pheochromocytoma.  Cushing's disease.  Coarctation of the aorta.  Scleroderma renal crisis.  Licorice (in excessive amounts).  Drugs (cocaine, methamphetamine). Your caregiver can explain any items above that apply to you.  In Children -- Secondary hypertension is more common and should always be considered.  Pregnancy -- Few women of childbearing age have high blood pressure. However, up to 10% of them develop hypertension of pregnancy. Generally, this will not harm the woman. It may be a sign of 3 complications of pregnancy: preeclampsia, HELLP syndrome, and eclampsia. Follow up and control with medication is necessary. SYMPTOMS   This condition normally does not produce any noticeable symptoms. It is usually found during a routine exam.  Malignant hypertension is a late problem of high blood pressure. It may have the following symptoms:  Headaches.  Blurred vision.  End-organ damage (this means your kidneys, heart, lungs, and other organs are being damaged).  Stressful situations can increase the blood pressure. If a person with normal blood pressure has their blood pressure go up while being seen by their caregiver, this is often termed "white coat hypertension." Its importance is not known. It may be related with eventually developing hypertension  or complications of hypertension.  Hypertension is often confused with mental tension, stress, and anxiety. DIAGNOSIS  The diagnosis is made by 3 separate blood pressure measurements. They are taken at least 1 week apart from each other. If there is organ damage from hypertension, the diagnosis may be made without repeat measurements. Hypertension is usually identified by having blood pressure readings:  Above 140/90 mmHg measured in both arms, at 3 separate times, over a couple weeks.  Over 130/80 mmHg should be considered a risk factor and may require treatment in patients with diabetes. Blood pressure readings over 120/80 mmHg are called "pre-hypertension" even in non-diabetic patients. To get a true blood pressure measurement, use the following guidelines. Be aware of the factors that can alter blood pressure readings.  Take measurements at least 1 hour after caffeine.  Take measurements 30 minutes after smoking and without any stress. This is another reason to quit smoking  it raises your blood pressure.  Use a proper cuff size. Ask your caregiver if you are not sure about your cuff size.  Most home blood pressure cuffs are automatic. They will measure systolic and diastolic pressures. The systolic pressure is the pressure reading at the start of sounds. Diastolic pressure is the pressure at which the sounds disappear. If you are elderly, measure pressures in multiple postures. Try sitting, lying or standing.  Sit at rest for a minimum of 5  minutes before taking measurements.  You should not be on any medications like decongestants. These are found in many cold medications.  Record your blood pressure readings and review them with your caregiver. If you have hypertension:  Your caregiver may do tests to be sure you do not have secondary hypertension (see "causes" above).  Your caregiver may also look for signs of metabolic syndrome. This is also called Syndrome X or Insulin  Resistance Syndrome. You may have this syndrome if you have type 2 diabetes, abdominal obesity, and abnormal blood lipids in addition to hypertension.  Your caregiver will take your medical and family history and perform a physical exam.  Diagnostic tests may include blood tests (for glucose, cholesterol, potassium, and kidney function), a urinalysis, or an EKG. Other tests may also be necessary depending on your condition. PREVENTION  There are important lifestyle issues that you can adopt to reduce your chance of developing hypertension:  Maintain a normal weight.  Limit the amount of salt (sodium) in your diet.  Exercise often.  Limit alcohol intake.  Get enough potassium in your diet. Discuss specific advice with your caregiver.  Follow a DASH diet (dietary approaches to stop hypertension). This diet is rich in fruits, vegetables, and low-fat dairy products, and avoids certain fats. PROGNOSIS  Essential hypertension cannot be cured. Lifestyle changes and medical treatment can lower blood pressure and reduce complications. The prognosis of secondary hypertension depends on the underlying cause. Many people whose hypertension is controlled with medicine or lifestyle changes can live a normal, healthy life.  RISKS AND COMPLICATIONS  While high blood pressure alone is not an illness, it often requires treatment due to its short- and long-term effects on many organs. Hypertension increases your risk for:  CVAs or strokes (cerebrovascular accident).  Heart failure due to chronically high blood pressure (hypertensive cardiomyopathy).  Heart attack (myocardial infarction).  Damage to the retina (hypertensive retinopathy).  Kidney failure (hypertensive nephropathy). Your caregiver can explain list items above that apply to you. Treatment of hypertension can significantly reduce the risk of complications. TREATMENT   For overweight patients, weight loss and regular exercise are  recommended. Physical fitness lowers blood pressure.  Mild hypertension is usually treated with diet and exercise. A diet rich in fruits and vegetables, fat-free dairy products, and foods low in fat and salt (sodium) can help lower blood pressure. Decreasing salt intake decreases blood pressure in a 1/3 of people.  Stop smoking if you are a smoker. The steps above are highly effective in reducing blood pressure. While these actions are easy to suggest, they are difficult to achieve. Most patients with moderate or severe hypertension end up requiring medications to bring their blood pressure down to a normal level. There are several classes of medications for treatment. Blood pressure pills (antihypertensives) will lower blood pressure by their different actions. Lowering the blood pressure by 10 mmHg may decrease the risk of complications by as much as 25%. The goal of treatment is effective blood pressure control. This will reduce your risk for complications. Your caregiver will help you determine the best treatment for you according to your lifestyle. What is excellent treatment for one person, may not be for you. HOME CARE INSTRUCTIONS   Do not smoke.  Follow the lifestyle changes outlined in the "Prevention" section.  If you are on medications, follow the directions carefully. Blood pressure medications must be taken as prescribed. Skipping doses reduces their benefit. It also puts you at risk for problems.  Follow  up with your caregiver, as directed.  If you are asked to monitor your blood pressure at home, follow the guidelines in the "Diagnosis" section above. SEEK MEDICAL CARE IF:   You think you are having medication side effects.  You have recurrent headaches or lightheadedness.  You have swelling in your ankles.  You have trouble with your vision. SEEK IMMEDIATE MEDICAL CARE IF:   You have sudden onset of chest pain or pressure, difficulty breathing, or other symptoms of a  heart attack.  You have a severe headache.  You have symptoms of a stroke (such as sudden weakness, difficulty speaking, difficulty walking). MAKE SURE YOU:   Understand these instructions.  Will watch your condition.  Will get help right away if you are not doing well or get worse. Document Released: 07/30/2005 Document Revised: 10/22/2011 Document Reviewed: 02/27/2007 [facility] Patient Information 2014 [place].

## 2013-09-17 NOTE — ED Notes (Signed)
Patient transported to CT 

## 2013-09-17 NOTE — ED Notes (Signed)
Called CT to inform pt ready for scan. Was told pt is in the que.

## 2013-09-17 NOTE — ED Provider Notes (Signed)
CSN: [id number]     Arrival date & time 09/17/13  1013 History   First MD Initiated Contact with Patient 09/17/13 1109     Chief Complaint  Patient presents with  . Chest Pain   (Consider location/radiation/quality/duration/timing/severity/associated sxs/prior Treatment) HPI Comments: Patient complains of pain in the left side of her chest it radiates to her back her arm and her shoulder has been ongoing for the past 3 months. It comes and goes lasting several hours at a time. It is worse at night. It is worse with arm movement. She denies any shortness of breath, nausea, dizziness or syncope. She denies any cardiac history. Last stress test was many years ago. She has a history of hypertension but is not take medications secondary to side effects. He describes the pain as a sharp ache a week's her up at night it is worse with position change and arm movement. It is not exertional.  The history is provided by the patient.    Past Medical History  Diagnosis Date  . Hypertension    History reviewed. No pertinent past surgical history. History reviewed. No pertinent family history. History  Substance Use Topics  . Smoking status: Never Smoker   . Smokeless tobacco: Not on file  . Alcohol Use: Not on file   OB History   Grav Para Term Preterm Abortions TAB SAB Ect Mult Living                 Review of Systems  Constitutional: Negative for fever, activity change and appetite change.  Respiratory: Positive for chest tightness. Negative for shortness of breath.   Cardiovascular: Positive for chest pain.  Gastrointestinal: Negative for nausea, vomiting and abdominal pain.  Genitourinary: Negative for dysuria, vaginal bleeding and vaginal discharge.  Musculoskeletal: Positive for back pain. Negative for arthralgias and myalgias.  Skin: Negative for rash.  Neurological: Negative for dizziness and headaches.  A complete 10 system review of systems was obtained and all systems are negative  except as noted in the HPI and PMH.    Allergies  Glycerine; Keflex; and Tetracyclines & related  Home Medications   Current Outpatient Rx  Name  Route  Sig  Dispense  Refill  . Cholecalciferol (VITAMIN D PO)   Oral   Take by mouth daily. 1 dropperful         . Misc Natural Products (GLUCOS-CHONDROIT-MSM COMPLEX PO)   Oral   Take 1 tablet by mouth daily.         . Multiple Vitamin (MULTIVITAMIN WITH MINERALS) TABS tablet   Oral   Take 1 tablet by mouth daily.         . Probiotic Product (PROBIOTIC DAILY PO)   Oral   Take 1 tablet by mouth daily.         Marland Kitchen zolpidem (AMBIEN) 10 MG tablet   Oral   Take 10 mg by mouth at bedtime as needed for sleep.         . naproxen (NAPROSYN) 500 MG tablet   Oral   Take 1 tablet (500 mg total) by mouth 2 (two) times daily.   30 tablet   0    BP 161/87  Pulse 66  Resp 18  SpO2 99% Physical Exam  Constitutional: She is oriented to person, place, and time. She appears well-developed and well-nourished. No distress.  HENT:  Head: Normocephalic and atraumatic.  Mouth/Throat: Oropharynx is clear and moist. No oropharyngeal exudate.  Eyes: Conjunctivae and EOM are normal.  Pupils are equal, round, and reactive to light.  Neck: Normal range of motion. Neck supple.  Cardiovascular: Normal rate, regular rhythm, normal heart sounds and intact distal pulses.   No murmur heard. Equal radial pulses, equal grip strengths  Pulmonary/Chest: Effort normal and breath sounds normal. No respiratory distress. She exhibits tenderness.  Left chest wall and anterior shoulder tenderness, worse with arm movement.  Abdominal: Soft. There is no tenderness. There is no rebound and no guarding.  Musculoskeletal: Normal range of motion. She exhibits no edema and no tenderness.  Neurological: She is alert and oriented to person, place, and time. No cranial nerve deficit. She exhibits normal muscle tone. Coordination normal.  Skin: Skin is warm.     ED Course  Procedures (including critical care time) Labs Review Labs Reviewed  BASIC METABOLIC PANEL - Abnormal; Notable for the following:    Glucose, Bld 134 (*)    GFR calc non Af Amer 59 (*)    GFR calc Af Amer 68 (*)    All other components within normal limits  CBC - Abnormal; Notable for the following:    Hemoglobin 15.7 (*)    All other components within normal limits  HEPATIC FUNCTION PANEL - Abnormal; Notable for the following:    AST 46 (*)    ALT 80 (*)    All other components within normal limits  D-DIMER, QUANTITATIVE - Abnormal; Notable for the following:    D-Dimer, Quant 0.57 (*)    All other components within normal limits  LIPASE, BLOOD  POCT I-STAT TROPONIN I  POCT I-STAT TROPONIN I   Imaging Review Dg Chest 2 View  09/17/2013   CLINICAL DATA:  Left-sided chest pain radiating into the left shoulder.  EXAM: CHEST  2 VIEW  COMPARISON:  10/16/2011  FINDINGS: Cardiac silhouette is normal in size and configuration. There is a small hiatal hernia mediastinum is otherwise normal in contour. Normal hilar contours.  2 small vague nodular densities project in the upper lobes on the frontal view not evident on the lateral view. These are not seen previously. The lungs are otherwise clear. No pleural effusion or pneumothorax.  The bony thorax is intact.  IMPRESSION: No acute cardiopulmonary disease.  Small nodular densities project in the upper lobes bilaterally on the frontal view only. Recommend followup chest radiographs and 3-4 months for reassessment. Alternatively, chest CT could be performed.   Electronically Signed   By: Lajean Manes M.D.   On: 09/17/2013 12:17   Ct Angio Chest Aortic Dissect W &/or W/o  09/17/2013   CLINICAL DATA:  Chest pain  EXAM: CT ANGIOGRAPHY CHEST WITH CONTRAST  TECHNIQUE: Multidetector CT imaging of the chest was performed using the standard protocol during bolus administration of intravenous contrast. Multiplanar CT image reconstructions  including MIPs were obtained to evaluate the vascular anatomy.  CONTRAST:  130mL OMNIPAQUE IOHEXOL 350 MG/ML SOLN  COMPARISON:  None.  FINDINGS: Noncontrast images demonstrate no evidence of intramural hematoma. Minimal peripheral aortic valvular calcifications. Moderate size hiatal hernia is noted. No evidence of coronary artery calcification.  No evidence of aortic dissection, transection, or aneurysm. Maximal diameter of the ascending aorta is 3.6 cm.  Innominate artery, right subclavian artery, right common carotid artery, right vertebral artery, left common carotid artery, left subclavian artery, and left vertebral artery are patent.  No evidence of abnormal mediastinal adenopathy. No pericardial effusion.  No pneumothorax or pleural effusion.  Patchy areas of ground-glass density are seen within the lungs likely due to  air trapping. Lungs are under aerated.  No acute bony deformity.  Wedge-shaped enhancement in the right lobe of the liver is likely a vascular phenomenon.  Tiny hypodensities in the thyroid gland are noted.  Review of the MIP images confirms the above findings.  IMPRESSION: No evidence of aortic dissection.   Electronically Signed   By: Maryclare Bean M.D.   On: 09/17/2013 15:48    EKG Interpretation    Date/Time:  Thursday September 17 2013 10:16:19 EST Ventricular Rate:  78 PR Interval:  152 QRS Duration: 68 QT Interval:  378 QTC Calculation: 430 R Axis:   67 Text Interpretation:  Normal sinus rhythm Normal ECG No significant change was found Confirmed by Lawanna Cecere  MD, Kenedy Haisley ([id number]) on 09/17/2013 11:26:58 AM            MDM   1. Hypertension   2. Atypical chest pain    3 month history of intermittent left-sided chest pain radiating to the back and the arm and shoulder. Patient is hypertensive. EKG is normal sinus rhythm.  Pain is not exertional. It lasts for hours at a time and is worse with arm movement. It is atypical for ACS. Blood pressure similarly elevated in  bilateral arms. 258 systolic versus 527 systolic. Do not suspect aortic dissection or PE.  Blood pressure remains elevated. EKG is nonischemic. Troponin is negative x2. No evidence of aortic dissection or PE.  Suspect pain is musculoskeletal as it is worse with movement and palpation. Patient recommended to have stress test nevertheless given her age and risk factors. Pain has been ongoing for several months there is no elevation of cardiac enzymes. She remains comfortable in the ED and is reading a book.  She'll need to be reevaluated by her PCP for possible restarting of her blood pressure medications. Advised to have outpatient stress testing. Return precautions discussed.  BP 161/87  Pulse 66  Resp 18  SpO2 99%    Ezequiel Essex, MD 09/17/13 1640

## 2013-09-17 NOTE — ED Notes (Signed)
Pt in c/o left sided chest pain that radiates into back, left arm and shoulder, states this pain has been going on for the last three months intermittently, but has increased recently, states this morning the pain woke her up and has been intense since, states she felt like her heart was racing at that time. Denies other symptoms.

## 2013-12-23 ENCOUNTER — Other Ambulatory Visit: Payer: Self-pay | Admitting: Obstetrics and Gynecology

## 2013-12-23 DIAGNOSIS — N63 Unspecified lump in unspecified breast: Secondary | ICD-10-CM

## 2014-01-01 ENCOUNTER — Ambulatory Visit
Admission: RE | Admit: 2014-01-01 | Discharge: 2014-01-01 | Disposition: A | Discharge status: Home / Self Care | Payer: Medicare Other | Source: Ambulatory Visit | Attending: Obstetrics and Gynecology | Admitting: Obstetrics and Gynecology

## 2014-01-01 DIAGNOSIS — N63 Unspecified lump in unspecified breast: Secondary | ICD-10-CM

## 2014-11-09 ENCOUNTER — Other Ambulatory Visit: Payer: Self-pay | Admitting: Family Medicine

## 2014-11-09 DIAGNOSIS — R1011 Right upper quadrant pain: Secondary | ICD-10-CM

## 2014-11-11 ENCOUNTER — Telehealth: Payer: Self-pay | Admitting: Cardiology

## 2014-11-11 ENCOUNTER — Encounter: Payer: Self-pay | Admitting: *Deleted

## 2014-11-11 NOTE — Telephone Encounter (Signed)
Pt c/o of CP last night at pain level of 3. Pt took 2 ASA and 1 of her husband nitro and it helped after awhile.  Woke up this morning and pain is at a 1 just lingering.  Pain is mainly in her arm pit and pack.  Pt has no c/o of SOB, just a headache and being tired.  Pt stated she felt some fluttering during her CP last night, but not since.  Pt stated it is not that bad right now but just still there.  Pt encouraged to rest and if feels she needs to take more nitro she should.  If pain increased to much then she can call 911 or go to emergency room.  Told pt I would call her back in an hour to check on her.  She appreciative and agreed with plan.   Called pt back she is feeling a bit better from before and is up and moving around now.  She was reminded of appt and to bring all medications with her when she comes, as she stated she takes Losartan for BP that is not listed on her medication list.  Pt agreed not additional questions at this time.

## 2014-11-11 NOTE — Telephone Encounter (Signed)
New Message  Pt c/o of Chest Pain: 1. Are you having CP right now? Some pain 2. Are you experiencing any other symptoms (ex. SOB, nausea, vomiting, sweating)? No, tired 3. How long have you been experiencing CP? 3/28 slightly took another nitro fine until 3/30 4. Is your CP continuous or coming and going? Comes and goes 5. Have you taken Nitroglycerin? Two last night, and aspirin

## 2014-11-12 ENCOUNTER — Encounter: Payer: Self-pay | Admitting: Cardiology

## 2014-11-12 ENCOUNTER — Encounter: Payer: Self-pay | Admitting: *Deleted

## 2014-11-12 ENCOUNTER — Ambulatory Visit (INDEPENDENT_AMBULATORY_CARE_PROVIDER_SITE_OTHER): Payer: Medicare Other | Admitting: Cardiology

## 2014-11-12 ENCOUNTER — Ambulatory Visit: Payer: Medicare Other | Admitting: Cardiology

## 2014-11-12 VITALS — BP 142/86 | HR 81 | Ht 61.0 in | Wt 136.0 lb

## 2014-11-12 DIAGNOSIS — R079 Chest pain, unspecified: Secondary | ICD-10-CM

## 2014-11-12 DIAGNOSIS — I10 Essential (primary) hypertension: Secondary | ICD-10-CM | POA: Diagnosis not present

## 2014-11-12 MED ORDER — NITROGLYCERIN 0.4 MG SL SUBL: 0.4000 mg | SUBLINGUAL_TABLET | SUBLINGUAL | Status: DC | PRN

## 2014-11-12 NOTE — Patient Instructions (Addendum)
The current medical regimen is effective;  continue present plan and medications. Please use Nitroglycerin as directed for chest pain.  Your physician has requested that you have a myoview. For further information please visit [email or web address]. Please follow instruction sheet, as given.  Further follow up will be based on these results.  Thank you for choosing [name]!!

## 2014-11-12 NOTE — Progress Notes (Signed)
Cardiology Office Note   Date:  11/12/2014   ID:  [name], DOB Nov 28, 1950, MRN [id number]  PCP:  No primary care provider on file.  Cardiologist:   Candee Furbish, MD       History of Present Illness: [name] is a 64 y.o. female who presents for evaluation of chest pain. Discussed with Dr. Orland Penman. She has been having off-and-on chest discomfort. She has been describing this is left axillary discomfort with no specific triggers. Pain would radiate to back and center of chest. Took two of husbands NTG (I take care of her husband)  She is concerned about the possibility of this being cardiac in origin. Previously she has been to the emergency department last year. Troponin was normal. CT scan of chest on 09/17/13 showed minor aortic calcification. No significant coronary calcifications were noted. No other abnormalities. No cardiology follow-up. She does not have any syncope, no specific shortness of breath, no palpitations. She is also currently being evaluated for right upper quadrant discomfort. She had an EKG performed on 11/08/14 which showed sinus rhythm rate 79 and possible old inferior infarct pattern. Nonspecific ST-T wave flattening was noted. 11/12/14, repeat at today's visit shows sinus rhythm, 81 with no other specific abnormalities.  Her hemoglobin is 14.8, creatinine 0.95, glucose 94, 4.2 potassium.  She has still been consistently exercising, 20 minutes on treadmill daily. No symptoms during this time. She also plays tennis twice a week at [employer]. No exacerbation of symptoms.     Past Medical History  Diagnosis Date  . Hypertension   . Sleep disturbance   . IBS (irritable bowel syndrome)   . Abnormal liver function test   . Dyslipidemia   . Hypertriglyceridemia     Past Surgical History  Procedure Laterality Date  . Breast reduction surgery    . Knee arthroscopy    . Tonsillectomy    . Root canal       Current Outpatient Prescriptions    Medication Sig Dispense Refill  . Alum Hydroxide-Mag Carbonate (GAVISCON PO) Take by mouth.    . Aspirin-Acetaminophen-Caffeine (EXCEDRIN PO) Take 2 tablets by mouth as needed (for migranes).    . Cholecalciferol (VITAMIN D PO) Take by mouth daily. 1 dropperful    . Glucosamine HCl-MSM (MSM GLUCOSAMINE PO) Take by mouth.    . IBUPROFEN PO Take 200 mg by mouth.    . losartan (COZAAR) 25 MG tablet Take 25 mg by mouth daily.    . Misc Natural Products (GLUCOS-CHONDROIT-MSM COMPLEX PO) Take 1 tablet by mouth daily.    . Multiple Vitamin (MULTIVITAMIN WITH MINERALS) TABS tablet Take 1 tablet by mouth daily.    . polyethylene glycol (MIRALAX / GLYCOLAX) packet Take 17 g by mouth as needed.     . Probiotic Product (PROBIOTIC DAILY PO) Take 1 tablet by mouth as needed.     . zaleplon (SONATA) 10 MG capsule Take 10 mg by mouth at bedtime as needed for sleep.    . nitroGLYCERIN (NITROSTAT) 0.4 MG SL tablet Place 1 tablet (0.4 mg total) under the tongue every 5 (five) minutes as needed for chest pain. 25 tablet prn   No current facility-administered medications for this visit.    Allergies:   Glycerine; Keflex; and Tetracyclines & related    Social History:  The patient  reports that she has never smoked. She does not have any smokeless tobacco history on file.   Family History:  The patient's family history includes  Cancer in her father and mother.  Uncle died of MI.    ROS:  Please see the history of present illness.   Otherwise, review of systems are positive for none.   All other systems are reviewed and negative.    PHYSICAL EXAM: VS:  BP 142/86 mmHg  Pulse 81  Ht 5\' 1"  (1.549 m)  Wt 136 lb (61.689 kg)  BMI 25.71 kg/m2 , BMI Body mass index is 25.71 kg/(m^2). GEN: Well nourished, well developed, in no acute distress HEENT: normal Neck: no JVD, carotid bruits, or masses Cardiac: RRR; no murmurs, rubs, or gallops,no edema  Respiratory:  clear to auscultation bilaterally, normal work  of breathing GI: soft, nontender, nondistended, + BS MS: no deformity or atrophy Skin: warm and dry, no rash Neuro:  Strength and sensation are intact Psych: euthymic mood, full affect, mildly anxious   EKG:  EKG is ordered today. The ekg ordered today demonstrates sinus rhythm, changes from previous EKG are no longer. See above.   Recent Labs: No results found for requested labs within last 365 days.    Lipid Panel No results found for: CHOL, TRIG, HDL, CHOLHDL, VLDL, LDLCALC, LDLDIRECT as above.    Wt Readings from Last 3 Encounters:  11/12/14 136 lb (61.689 kg)      Other studies Reviewed: Additional studies/ records that were reviewed today include: Prior office records, EKG, lab work. Review of the above records demonstrates: As described above   ASSESSMENT AND PLAN:  1.  Atypical chest pain- mostly intermittent left axillary pain, nonexertional that sometimes radiates to the center of her chest, back. She states that she took one of her husband's nitroglycerin and this seemed to help. Based upon the concern that she has at this may be cardiac, I would like to proceed with nuclear stress test. I will also give her a prescription for nitroglycerin as needed. She plays tennis. She has been walking on her treadmill and this does not exacerbate her symptoms. Her symptoms could be musculoskeletal. If stress test is low risk, this will be reassuring.  2. Essential hypertension-on losartan. Doing well. Well controlled.   Current medicines are reviewed at length with the patient today.  The patient does not have concerns regarding medicines.  The following changes have been made:  Nitroglycerin when necessary.  Labs/ tests ordered today include:   Orders Placed This Encounter  Procedures  . Myocardial Perfusion Imaging  . EKG 12-Lead     Disposition:   Follow-up with results of stress test.   Signed, Candee Furbish, MD  11/12/2014 12:13 PM    [facility], [place], [place]  [address] Phone: [phone]; Fax: [phone]

## 2014-11-16 ENCOUNTER — Telehealth: Payer: Self-pay | Admitting: Cardiology

## 2014-11-16 NOTE — Telephone Encounter (Signed)
New problem    Pt had left arm pain radiating down to her back on Monday. Pt took nitro and it went away. Pt wanted the doctor to know.

## 2014-11-16 NOTE — Telephone Encounter (Signed)
Per pt call - reports having arm pain like before.  She reports it lasted about 2 hrs.  She was in bed when she noticed it. She denies any SOB or N/V.  It was maybe a 3 on a scale of 1 to 10.  She used 1 SL Ntg after 1 hour and the discomforted started to subside and she denied not to use any more SL at that time.  She has not had any further episodes.  She rescheduled her nuc study until 4/14. Advised I will forward information to Dr Marlou Porch for his knowledge.  She will call back if pain returns.  Reviewed with her how to use SL Ntg and when to call 911.  She states understanding.

## 2014-11-17 NOTE — Telephone Encounter (Signed)
Thanks for update. Agree. Await NUC results.  Candee Furbish, MD

## 2014-11-19 NOTE — Telephone Encounter (Signed)
Spoke with pt who states understanding.  She has not had any further s/s.  She will have myoview as scheduled 4/14.

## 2014-11-23 ENCOUNTER — Encounter (HOSPITAL_COMMUNITY): Payer: Medicare Other

## 2014-11-25 ENCOUNTER — Ambulatory Visit (HOSPITAL_COMMUNITY): Payer: Medicare Other | Attending: Cardiology | Admitting: Radiology

## 2014-11-25 DIAGNOSIS — R079 Chest pain, unspecified: Secondary | ICD-10-CM | POA: Insufficient documentation

## 2014-11-25 DIAGNOSIS — I10 Essential (primary) hypertension: Secondary | ICD-10-CM | POA: Insufficient documentation

## 2014-11-25 MED ORDER — TECHNETIUM TC 99M SESTAMIBI GENERIC - CARDIOLITE
33.0000 | Freq: Once | INTRAVENOUS | Status: AC | PRN
  Administered 2014-11-25: 33 via INTRAVENOUS

## 2014-11-25 MED ORDER — TECHNETIUM TC 99M SESTAMIBI GENERIC - CARDIOLITE
11.0000 | Freq: Once | INTRAVENOUS | Status: AC | PRN
  Administered 2014-11-25: 11 via INTRAVENOUS

## 2014-11-25 NOTE — Progress Notes (Signed)
[facility] [address] [place], [place] [address] [phone]    Cardiology Nuclear Med Study  [name] is a 64 y.o. female     MRN : [id number]     DOB: [birth year]  Procedure Date: 11/25/2014  Nuclear Med Background Indication for Stress Test:  Evaluation for Ischemia History:  No known CAD Cardiac Risk Factors: Hypertension  Symptoms:  Chest Pain   Nuclear Pre-Procedure Caffeine/Decaff Intake:  None NPO After: 11:00pm   Lungs:  clear O2 Sat: 97% on room air. IV 0.9% NS with Angio Cath:  22g  IV Site: R Hand  IV Started by:  Crissie Figures, RN  Chest Size (in):  34 Cup Size: D  Height: 5\' 1"  (1.549 m)  Weight:  136 lb (61.689 kg)  BMI:  Body mass index is 25.71 kg/(m^2). Tech Comments:  N/A    Nuclear Med Study 1 or 2 day study: 1 day  Stress Test Type:  Stress  Reading MD: N/A  Order Authorizing Provider:  Candee Furbish, MD  Resting Radionuclide: Technetium 31m Sestamibi  Resting Radionuclide Dose: 11.0 mCi   Stress Radionuclide:  Technetium 69m Sestamibi  Stress Radionuclide Dose: 33.0 mCi           Stress Protocol Rest HR: 74 Stress HR: 144  Rest BP: 169/94 Stress BP: 154/89  Exercise Time (min): 6:00 METS: 7.2   Predicted Max HR: 155 bpm % Max HR: 92.9 bpm Rate Pressure Product: 73428   Dose of Adenosine (mg):  n/a Dose of Lexiscan: n/a mg  Dose of Atropine (mg): n/a Dose of Dobutamine: n/a mcg/kg/min (at max HR)  Stress Test Technologist: Glade Lloyd, BS-ES  Nuclear Technologist:  Earl Many, CNMT     Rest Procedure:  Myocardial perfusion imaging was performed at rest 45 minutes following the intravenous administration of Technetium 70m Sestamibi. Rest ECG: SR 74 bpm    Stress Procedure:  The patient exercised on the treadmill utilizing the Bruce Protocol for 6:00 minutes. The patient stopped due to fatigue and denied any chest pain.  Technetium 74m Sestamibi was injected at peak exercise and myocardial perfusion  imaging was performed after a brief delay. Stress ECG: No significant ST segment change suggestive of ischemia.  QPS Raw Data Images:  Soft tissue (diaphragm, bowel activity) underlie heart.   Stress Images:  Normal homogeneous uptake in all areas of the myocardium. Rest Images:  Normal homogeneous uptake in all areas of the myocardium. Subtraction (SDS):  No evidence of ischemia. Transient Ischemic Dilatation (Normal <1.22):  0.75 Lung/Heart Ratio (Normal <0.45):  0.26  Quantitative Gated Spect Images QGS EDV:  49 ml QGS ESV:  6 ml  Impression Exercise Capacity:  Fair exercise capacity. BP Response:  Normal blood pressure response. Clinical Symptoms:  No significant symptoms noted. ECG Impression:  No significant ST segment change suggestive of ischemia. Comparison with Prior Nuclear Study:No prior study  Overall Impression:  Normal stress nuclear study.  LV Ejection Fraction: 87%.  LV Wall Motion:  NL LV Function; NL Wall Motion   Dorris Carnes

## 2014-12-13 ENCOUNTER — Ambulatory Visit
Admission: RE | Admit: 2014-12-13 | Discharge: 2014-12-13 | Disposition: A | Discharge status: Home / Self Care | Payer: Medicare Other | Source: Ambulatory Visit | Attending: Family Medicine | Admitting: Family Medicine

## 2014-12-13 DIAGNOSIS — R1011 Right upper quadrant pain: Secondary | ICD-10-CM

## 2016-09-11 DIAGNOSIS — M5136 Other intervertebral disc degeneration, lumbar region: Secondary | ICD-10-CM | POA: Diagnosis not present

## 2016-09-11 DIAGNOSIS — M9905 Segmental and somatic dysfunction of pelvic region: Secondary | ICD-10-CM | POA: Diagnosis not present

## 2016-09-11 DIAGNOSIS — M9904 Segmental and somatic dysfunction of sacral region: Secondary | ICD-10-CM | POA: Diagnosis not present

## 2016-09-11 DIAGNOSIS — M9903 Segmental and somatic dysfunction of lumbar region: Secondary | ICD-10-CM | POA: Diagnosis not present

## 2016-09-17 DIAGNOSIS — I10 Essential (primary) hypertension: Secondary | ICD-10-CM | POA: Diagnosis not present

## 2016-09-17 DIAGNOSIS — J209 Acute bronchitis, unspecified: Secondary | ICD-10-CM | POA: Diagnosis not present

## 2016-10-31 DIAGNOSIS — B360 Pityriasis versicolor: Secondary | ICD-10-CM | POA: Diagnosis not present

## 2016-10-31 DIAGNOSIS — L570 Actinic keratosis: Secondary | ICD-10-CM | POA: Diagnosis not present

## 2016-10-31 DIAGNOSIS — L820 Inflamed seborrheic keratosis: Secondary | ICD-10-CM | POA: Diagnosis not present

## 2016-10-31 DIAGNOSIS — D492 Neoplasm of unspecified behavior of bone, soft tissue, and skin: Secondary | ICD-10-CM | POA: Diagnosis not present

## 2016-10-31 DIAGNOSIS — D229 Melanocytic nevi, unspecified: Secondary | ICD-10-CM | POA: Diagnosis not present

## 2017-01-16 DIAGNOSIS — Z0120 Encounter for dental examination and cleaning without abnormal findings: Secondary | ICD-10-CM | POA: Diagnosis not present

## 2017-01-18 DIAGNOSIS — Z1389 Encounter for screening for other disorder: Secondary | ICD-10-CM | POA: Diagnosis not present

## 2017-01-18 DIAGNOSIS — E781 Pure hyperglyceridemia: Secondary | ICD-10-CM | POA: Diagnosis not present

## 2017-01-18 DIAGNOSIS — G479 Sleep disorder, unspecified: Secondary | ICD-10-CM | POA: Diagnosis not present

## 2017-01-18 DIAGNOSIS — L570 Actinic keratosis: Secondary | ICD-10-CM | POA: Diagnosis not present

## 2017-01-18 DIAGNOSIS — Z0000 Encounter for general adult medical examination without abnormal findings: Secondary | ICD-10-CM | POA: Diagnosis not present

## 2017-01-18 DIAGNOSIS — I10 Essential (primary) hypertension: Secondary | ICD-10-CM | POA: Diagnosis not present

## 2017-02-16 ENCOUNTER — Encounter (HOSPITAL_COMMUNITY): Payer: Self-pay | Admitting: Emergency Medicine

## 2017-02-16 ENCOUNTER — Emergency Department (HOSPITAL_COMMUNITY)
Admission: EM | Admit: 2017-02-16 | Discharge: 2017-02-16 | Disposition: A | Discharge status: Home / Self Care | Payer: Medicare HMO | Attending: Emergency Medicine | Admitting: Emergency Medicine

## 2017-02-16 ENCOUNTER — Emergency Department (HOSPITAL_COMMUNITY): Payer: Medicare HMO

## 2017-02-16 DIAGNOSIS — I10 Essential (primary) hypertension: Secondary | ICD-10-CM | POA: Diagnosis not present

## 2017-02-16 DIAGNOSIS — Z79899 Other long term (current) drug therapy: Secondary | ICD-10-CM | POA: Diagnosis not present

## 2017-02-16 DIAGNOSIS — R0789 Other chest pain: Secondary | ICD-10-CM | POA: Diagnosis not present

## 2017-02-16 DIAGNOSIS — R079 Chest pain, unspecified: Secondary | ICD-10-CM | POA: Diagnosis not present

## 2017-02-16 LAB — I-STAT TROPONIN, ED: TROPONIN I, POC: 0 ng/mL (ref 0.00–0.08)

## 2017-02-16 LAB — CBC
HCT: 43.4 % (ref 36.0–46.0)
Hemoglobin: 15 g/dL (ref 12.0–15.0)
MCH: 31.5 pg (ref 26.0–34.0)
MCHC: 34.6 g/dL (ref 30.0–36.0)
MCV: 91.2 fL (ref 78.0–100.0)
Platelets: 257 10*3/uL (ref 150–400)
RBC: 4.76 MIL/uL (ref 3.87–5.11)
RDW: 12.3 % (ref 11.5–15.5)
WBC: 5 10*3/uL (ref 4.0–10.5)

## 2017-02-16 LAB — BASIC METABOLIC PANEL
Anion gap: 7 (ref 5–15)
BUN: 15 mg/dL (ref 6–20)
CALCIUM: 9.3 mg/dL (ref 8.9–10.3)
CO2: 24 mmol/L (ref 22–32)
Chloride: 106 mmol/L (ref 101–111)
Creatinine, Ser: 0.87 mg/dL (ref 0.44–1.00)
GFR calc Af Amer: >60 mL/min (ref >60)
GLUCOSE: 117 mg/dL — AB (ref 65–99)
Potassium: 4.1 mmol/L (ref 3.5–5.1)
Sodium: 137 mmol/L (ref 135–145)

## 2017-02-16 NOTE — ED Triage Notes (Signed)
Pt c/o left sided chest pain onset Thursday while in bed. Pt reports pain radiates to left shoulder and back with nausea and feeling fatigue.

## 2017-02-16 NOTE — ED Provider Notes (Signed)
[facility] DEPT Provider Note   CSN: [id number] Arrival date & time: 02/16/17  7829     History   Chief Complaint Chief Complaint  Patient presents with  . Chest Pain    HPI [name] is a 66 y.o. female.  HPI  66 year old female with a history of hypertension, hyperlipidemia who presents to the emergency department for 2 days of constant left lateral pressure like chest pain that is not exertional. Patient reports that pain is exacerbated with palpation of the left chest and movement of her arm. No relieving factors. No associated shortness of breath, nausea, vomiting, abdominal pain, palpitations, dizziness.  Patient reports prior stress testing several years ago which was negative. No prior heart attacks, she is a nonsmoker, no family history of heart attacks before the age of 52.  No history of blood clots, recent travel or surgeries, hormone replacement therapy, or history of personal cancer.  Of note patient endorses performing yoga on a daily basis.  Past Medical History:  Diagnosis Date  . Abnormal liver function test   . Dyslipidemia   . Hypertension   . Hypertriglyceridemia   . IBS (irritable bowel syndrome)   . Sleep disturbance     Patient Active Problem List   Diagnosis Date Noted  . Pain in the chest 11/12/2014  . Essential hypertension 11/12/2014    Past Surgical History:  Procedure Laterality Date  . BREAST REDUCTION SURGERY    . CARDIOVASCULAR STRESS TEST    . KNEE ARTHROSCOPY    . ROOT CANAL    . SHOULDER SURGERY    . TONSILLECTOMY      OB History    No data available       Home Medications    Prior to Admission medications   Medication Sig Start Date End Date Taking? Authorizing Provider  acetaminophen (TYLENOL) 325 MG tablet Take 650 mg by mouth every 6 (six) hours as needed for moderate pain or headache.   Yes [provider]  Alum Hydroxide-Mag Carbonate (GAVISCON PO) Take 2 tablets by mouth as needed (for  indegestion).    Yes [provider]  irbesartan (AVAPRO) 75 MG tablet Take 75 mg by mouth daily.   Yes [provider]  Misc Natural Products (GLUCOS-CHONDROIT-MSM COMPLEX PO) Take 2 tablets by mouth 2 (two) times daily.    Yes [provider]  Multiple Vitamin (MULTIVITAMIN WITH MINERALS) TABS tablet Take 2 tablets by mouth daily.    Yes [provider]  polyethylene glycol (MIRALAX / GLYCOLAX) packet Take 17 g by mouth daily as needed for moderate constipation.    Yes [provider]  Probiotic Product (PROBIOTIC DAILY PO) Take 1 tablet by mouth as needed (for health).    Yes [provider]  zaleplon (SONATA) 10 MG capsule Take 10 mg by mouth at bedtime as needed for sleep.   Yes [provider]  nitroGLYCERIN (NITROSTAT) 0.4 MG SL tablet Place 1 tablet (0.4 mg total) under the tongue every 5 (five) minutes as needed for chest pain. 11/12/14   Jerline Pain, MD    Family History Family History  Problem Relation Age of Onset  . Cancer Mother   . Cancer Father     Social History Social History  Substance Use Topics  . Smoking status: Never Smoker  . Smokeless tobacco: Never Used  . Alcohol use No     Allergies   Fenofibrate; Glycerine [glycerin]; Keflex [cephalexin]; and Tetracyclines & related   Review  of Systems Review of Systems All other systems are reviewed and are negative for acute change except as noted in the HPI   Physical Exam Updated Vital Signs BP (!) 143/91 (BP Location: Left Arm)   Pulse 71   Temp 98 F (36.7 C) (Oral)   Resp 16   Ht 5\' 1"  (1.549 m)   Wt 61.7 kg (136 lb)   SpO2 99%   BMI 25.70 kg/m   Physical Exam  Constitutional: She is oriented to person, place, and time. She appears well-developed and well-nourished. No distress.  HENT:  Head: Normocephalic and atraumatic.  Nose: Nose normal.  Eyes: Conjunctivae and EOM are normal. Pupils are equal, round, and reactive to light.  Right eye exhibits no discharge. Left eye exhibits no discharge. No scleral icterus.  Neck: Normal range of motion. Neck supple.  Cardiovascular: Normal rate and regular rhythm.  Exam reveals no gallop and no friction rub.   No murmur heard. Pulmonary/Chest: Effort normal and breath sounds normal. No stridor. No respiratory distress. She has no rales. She exhibits tenderness.    Pain is completely reproducible with palpation of the left lateral chest wall. Pain is also exacerbated by activation of the left pectoralis muscle.  Abdominal: Soft. She exhibits no distension. There is no tenderness.  Musculoskeletal: She exhibits no edema or tenderness.  Neurological: She is alert and oriented to person, place, and time.  Skin: Skin is warm and dry. No rash noted. She is not diaphoretic. No erythema.  Psychiatric: She has a normal mood and affect.  Vitals reviewed.    ED Treatments / Results  Labs (all labs ordered are listed, but only abnormal results are displayed) Labs Reviewed  BASIC METABOLIC PANEL - Abnormal; Notable for the following:       Result Value   Glucose, Bld 117 (*)    All other components within normal limits  CBC  I-STAT TROPOININ, ED    EKG  EKG Interpretation  Date/Time:  Saturday February 16 2017 09:54:15 EDT Ventricular Rate:  82 PR Interval:  146 QRS Duration: 74 QT Interval:  370 QTC Calculation: 432 R Axis:   53 Text Interpretation:  Normal sinus rhythm Normal ECG No significant change since last tracing Confirmed by Addison Lank [phone]) on 02/16/2017 10:28:32 AM       Radiology Dg Chest 2 View  Result Date: 02/16/2017 CLINICAL DATA:  Left-sided chest pain. EXAM: CHEST  2 VIEW COMPARISON:  09/17/2013. FINDINGS: Lungs are hyperexpanded. The lungs are clear without focal pneumonia, edema, pneumothorax or pleural effusion. Cardiopericardial silhouette is at upper limits of normal for size. The visualized bony structures of the thorax are intact. Nodular  density/densities projecting over the lungs are compatible with pads for telemetry leads. IMPRESSION: No active cardiopulmonary disease. Electronically Signed   By: Misty Stanley M.D.   On: 02/16/2017 10:26    Procedures Procedures (including critical care time)  Medications Ordered in ED Medications - No data to display   Initial Impression / Assessment and Plan / ED Course  I have reviewed the triage vital signs and the nursing notes.  Pertinent labs & imaging results that were available during my care of the patient were reviewed by me and considered in my medical decision making (see chart for details).  Clinical Course as of Feb 16 1149  Sat Feb 16, 2017  1119 2 days of constant left-sided lateral chest wall pain highly inconsistent with ACS. Most consistent with chest wall/MSK pain. EKG without acute ischemic  changes or evidence of pericarditis. Triage labs revealed initial troponin that was negative. Given the duration of the stent chest pain, feel this is sufficient to rule out ACS. Low pretest probability for pulmonary embolism. Presentation not classic for aortic dissection or esophageal perforation.  Chest x-ray without evidence suggestive of pneumonia, pneumothorax, pneumomediastinum.  No abnormal contour of the mediastinum to suggest dissection. No evidence of acute injuries.   [PC]    Clinical Course User Index [PC] Cardama, Grayce Sessions, MD    The patient is safe for discharge with strict return precautions.   Final Clinical Impressions(s) / ED Diagnoses   Final diagnoses:  Chest wall pain   Disposition: Discharge  Condition: Good  I have discussed the results, Dx and Tx plan with the patient who expressed understanding and agree(s) with the plan. Discharge instructions discussed at great length. The patient was given strict return precautions who verbalized understanding of the instructions. No further questions at time of discharge.    New Prescriptions    No medications on file    Follow Up: [name], [place] [address] [phone]  Schedule an appointment as soon as possible for a visit  As needed      Fatima Blank, MD 02/16/17 1151

## 2017-02-16 NOTE — ED Notes (Signed)
Declined W/C at D/C and was escorted to lobby by RN. 

## 2017-02-19 DIAGNOSIS — R69 Illness, unspecified: Secondary | ICD-10-CM | POA: Diagnosis not present

## 2017-03-12 DIAGNOSIS — M5136 Other intervertebral disc degeneration, lumbar region: Secondary | ICD-10-CM | POA: Diagnosis not present

## 2017-03-12 DIAGNOSIS — M9904 Segmental and somatic dysfunction of sacral region: Secondary | ICD-10-CM | POA: Diagnosis not present

## 2017-03-12 DIAGNOSIS — M9903 Segmental and somatic dysfunction of lumbar region: Secondary | ICD-10-CM | POA: Diagnosis not present

## 2017-03-12 DIAGNOSIS — M9905 Segmental and somatic dysfunction of pelvic region: Secondary | ICD-10-CM | POA: Diagnosis not present

## 2017-03-13 DIAGNOSIS — M9903 Segmental and somatic dysfunction of lumbar region: Secondary | ICD-10-CM | POA: Diagnosis not present

## 2017-03-13 DIAGNOSIS — M9905 Segmental and somatic dysfunction of pelvic region: Secondary | ICD-10-CM | POA: Diagnosis not present

## 2017-03-13 DIAGNOSIS — M9904 Segmental and somatic dysfunction of sacral region: Secondary | ICD-10-CM | POA: Diagnosis not present

## 2017-03-13 DIAGNOSIS — M5136 Other intervertebral disc degeneration, lumbar region: Secondary | ICD-10-CM | POA: Diagnosis not present

## 2017-03-27 DIAGNOSIS — M9903 Segmental and somatic dysfunction of lumbar region: Secondary | ICD-10-CM | POA: Diagnosis not present

## 2017-03-27 DIAGNOSIS — M9904 Segmental and somatic dysfunction of sacral region: Secondary | ICD-10-CM | POA: Diagnosis not present

## 2017-03-27 DIAGNOSIS — M5136 Other intervertebral disc degeneration, lumbar region: Secondary | ICD-10-CM | POA: Diagnosis not present

## 2017-03-27 DIAGNOSIS — M9905 Segmental and somatic dysfunction of pelvic region: Secondary | ICD-10-CM | POA: Diagnosis not present

## 2017-06-18 DIAGNOSIS — R69 Illness, unspecified: Secondary | ICD-10-CM | POA: Diagnosis not present

## 2017-07-10 DIAGNOSIS — Z800 Family history of malignant neoplasm of digestive organs: Secondary | ICD-10-CM | POA: Diagnosis not present

## 2017-07-10 DIAGNOSIS — K5730 Diverticulosis of large intestine without perforation or abscess without bleeding: Secondary | ICD-10-CM | POA: Diagnosis not present

## 2017-07-25 DIAGNOSIS — E781 Pure hyperglyceridemia: Secondary | ICD-10-CM | POA: Diagnosis not present

## 2017-07-25 DIAGNOSIS — I10 Essential (primary) hypertension: Secondary | ICD-10-CM | POA: Diagnosis not present

## 2017-07-25 DIAGNOSIS — R05 Cough: Secondary | ICD-10-CM | POA: Diagnosis not present

## 2017-07-25 DIAGNOSIS — G479 Sleep disorder, unspecified: Secondary | ICD-10-CM | POA: Diagnosis not present

## 2017-08-13 DIAGNOSIS — C50919 Malignant neoplasm of unspecified site of unspecified female breast: Secondary | ICD-10-CM

## 2017-08-13 HISTORY — DX: Malignant neoplasm of unspecified site of unspecified female breast: C50.919

## 2017-08-13 HISTORY — PX: BREAST BIOPSY: SHX20

## 2017-08-16 DIAGNOSIS — Z1231 Encounter for screening mammogram for malignant neoplasm of breast: Secondary | ICD-10-CM | POA: Diagnosis not present

## 2017-08-21 ENCOUNTER — Other Ambulatory Visit: Payer: Self-pay | Admitting: Obstetrics & Gynecology

## 2017-08-21 DIAGNOSIS — R928 Other abnormal and inconclusive findings on diagnostic imaging of breast: Secondary | ICD-10-CM

## 2017-08-26 ENCOUNTER — Ambulatory Visit
Admission: RE | Admit: 2017-08-26 | Discharge: 2017-08-26 | Disposition: A | Discharge status: Home / Self Care | Payer: Medicare HMO | Source: Ambulatory Visit | Attending: Obstetrics & Gynecology | Admitting: Obstetrics & Gynecology

## 2017-08-26 ENCOUNTER — Other Ambulatory Visit: Payer: Self-pay | Admitting: Obstetrics & Gynecology

## 2017-08-26 DIAGNOSIS — R928 Other abnormal and inconclusive findings on diagnostic imaging of breast: Secondary | ICD-10-CM

## 2017-08-26 DIAGNOSIS — M1712 Unilateral primary osteoarthritis, left knee: Secondary | ICD-10-CM | POA: Diagnosis not present

## 2017-08-26 DIAGNOSIS — R921 Mammographic calcification found on diagnostic imaging of breast: Secondary | ICD-10-CM

## 2017-08-26 DIAGNOSIS — M1711 Unilateral primary osteoarthritis, right knee: Secondary | ICD-10-CM | POA: Diagnosis not present

## 2017-08-27 ENCOUNTER — Ambulatory Visit
Admission: RE | Admit: 2017-08-27 | Discharge: 2017-08-27 | Disposition: A | Discharge status: Home / Self Care | Payer: Medicare HMO | Source: Ambulatory Visit | Attending: Obstetrics & Gynecology | Admitting: Obstetrics & Gynecology

## 2017-08-27 DIAGNOSIS — R921 Mammographic calcification found on diagnostic imaging of breast: Secondary | ICD-10-CM

## 2017-08-27 DIAGNOSIS — D0512 Intraductal carcinoma in situ of left breast: Secondary | ICD-10-CM | POA: Diagnosis not present

## 2017-09-02 ENCOUNTER — Other Ambulatory Visit: Payer: Self-pay | Admitting: Surgery

## 2017-09-02 DIAGNOSIS — D0512 Intraductal carcinoma in situ of left breast: Secondary | ICD-10-CM | POA: Diagnosis not present

## 2017-09-03 ENCOUNTER — Telehealth: Payer: Self-pay | Admitting: Oncology

## 2017-09-03 ENCOUNTER — Encounter: Payer: Self-pay | Admitting: Oncology

## 2017-09-03 ENCOUNTER — Encounter: Payer: Self-pay | Admitting: Radiation Oncology

## 2017-09-03 NOTE — Telephone Encounter (Signed)
Appt has been scheduled for the pt to see Dr. Jana Hakim on 2/11 at 4pm. Pt aware to arrive 30 minutes early. Letter mailed to the pt.

## 2017-09-05 ENCOUNTER — Encounter (HOSPITAL_COMMUNITY): Admission: RE | Admit: 2017-09-05 | Payer: Medicare HMO | Source: Ambulatory Visit

## 2017-09-06 ENCOUNTER — Other Ambulatory Visit: Payer: Self-pay | Admitting: Surgery

## 2017-09-06 DIAGNOSIS — D0512 Intraductal carcinoma in situ of left breast: Secondary | ICD-10-CM

## 2017-09-09 ENCOUNTER — Encounter (HOSPITAL_COMMUNITY): Admission: RE | Payer: Self-pay | Source: Ambulatory Visit

## 2017-09-09 ENCOUNTER — Ambulatory Visit (HOSPITAL_COMMUNITY): Admission: RE | Admit: 2017-09-09 | Payer: Medicare HMO | Source: Ambulatory Visit | Admitting: Surgery

## 2017-09-09 SURGERY — BREAST LUMPECTOMY WITH RADIOACTIVE SEED LOCALIZATION
Anesthesia: General | Site: Breast | Laterality: Left

## 2017-09-13 HISTORY — PX: BREAST LUMPECTOMY: SHX2

## 2017-09-14 ENCOUNTER — Ambulatory Visit
Admission: RE | Admit: 2017-09-14 | Discharge: 2017-09-14 | Disposition: A | Discharge status: Home / Self Care | Payer: Medicare HMO | Source: Ambulatory Visit | Attending: Surgery | Admitting: Surgery

## 2017-09-14 DIAGNOSIS — D0512 Intraductal carcinoma in situ of left breast: Secondary | ICD-10-CM

## 2017-09-16 ENCOUNTER — Other Ambulatory Visit: Payer: Self-pay | Admitting: Surgery

## 2017-09-18 ENCOUNTER — Encounter: Payer: Self-pay | Admitting: *Deleted

## 2017-09-19 ENCOUNTER — Other Ambulatory Visit: Payer: Self-pay | Admitting: Surgery

## 2017-09-19 DIAGNOSIS — D0512 Intraductal carcinoma in situ of left breast: Secondary | ICD-10-CM

## 2017-09-19 NOTE — Progress Notes (Signed)
[name]  [phone] [phone]     ID: CHEN HOLZMAN DOB: Apr 15, 1951  MR#: [id number]  VXB#:[id number]  Patient Care Team: [name], MD as PCP - General (Family Medicine) Nate Perri, Virgie Dad, MD as Consulting Physician (Oncology) Coralie Keens, MD as Consulting Physician (General Surgery) Kyung Rudd, MD as Consulting Physician (Radiation Oncology) Shawnie Dapper OTHER MD:  CHIEF COMPLAINT: estrogen reeptor positive DCIS  CURRENT TREATMENT: Awaiting definitive surgery   HISTORY OF CURRENT ILLNESS: [name] had routine mammography at [facility] under Dr. Alden Hipp on 08/17/2017 that found a possible abnormality in the left breast. She underwent unilateral left diagnostic mammography with tomography at [place] on 08/26/2017 showing: breast density category B. Grouped calcification in the upper outer quadrant of the left breast at middle depth are suspicious for malignancy.  Accordingly on 08/27/2017 she proceeded to biopsy of the left breast area in question. The pathology from this procedure showed ([id number]): Ductal carcinoma in situ with calcifications. Prognostic indicators significant for: estrogen receptor, 95% positive and progesterone receptor, 90% positive, both with strong staining intensity.   The patient's subsequent history is as detailed below.  INTERVAL HISTORY: Peytyn was evaluated in the breast cancer clinic on 09/23/2017 accompanied by her husband, [name]. Her case was also presented at the multidisciplinary breast cancer conference on 09/04/2017. At that time a preliminary plan was proposed: Breast conserving surgery without sentinel lymph node sampling with consideration of the COMET trial  REVIEW OF SYSTEMS: There were no specific symptoms leading to the original mammogram, which was routinely scheduled. She notes that she has occasional headaches. She notes that her food may get  caught in her throat occasionally. She notes that she takes Miralax and drinks prune juice for occasional constipation. The patient denies unusual visual changes, nausea, vomiting, stiff neck, dizziness, or gait imbalance. There has been no cough, phlegm production, or pleurisy, no chest pain or pressure, and no change in bowel or bladder habits. The patient denies fever, rash, bleeding, unexplained fatigue or unexplained weight loss. A detailed review of systems was otherwise entirely negative.   PAST MEDICAL HISTORY: Past Medical History:  Diagnosis Date  . Abnormal liver function test   . Dyslipidemia   . Hypertension   . Hypertriglyceridemia   . IBS (irritable bowel syndrome)   . Sleep disturbance   Yeast allergy, HTN  PAST SURGICAL HISTORY: Past Surgical History:  Procedure Laterality Date  . BREAST REDUCTION SURGERY    . CARDIOVASCULAR STRESS TEST    . KNEE ARTHROSCOPY    . REDUCTION MAMMAPLASTY Bilateral 1997  . ROOT CANAL    . SHOULDER SURGERY    . TONSILLECTOMY    C-section 1984. Right shoulder and right knee surgery.  FAMILY HISTORY Family History  Problem Relation Age of Onset  . Cancer Mother   . Breast cancer Mother 65  . Cancer Father   The patient's mother died at age 74 due to breast cancer, diagnosed in her 57s. The patient's father died at age 52 due to colon cancer. The patient has 1 brother and no sisters. She denies a family history of other breast, ovarian, prostate, or colon cancers.   GYNECOLOGIC HISTORY:  No LMP recorded. Patient is postmenopausal. 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 [name], her husband, is retired from being a Dispensing optician in the Therapist, music. The patient's son, [name] "[address] [place], is a  Geophysicist/field seismologist in [place], [place]. The patient has no grandchildren. She belongs to the [employer].    ADVANCED DIRECTIVES:    HEALTH MAINTENANCE: Social History   Tobacco Use  . Smoking status: Never Smoker  . Smokeless tobacco: Never Used  Substance Use Topics  . Alcohol use: No  . Drug use: No     Colonoscopy: within 2 years  PAP: 2017  Bone density: more than 2 years ago: osteopenia (2009) under Dr. Theadore Nan    Allergies  Allergen Reactions  . Yeast Other (See Comments)    Abdominal pain  . Fenofibrate Other (See Comments)    Unknown  . Glycerine [Glycerin] Nausea And Vomiting  . Keflex [Cephalexin] Nausea And Vomiting  . Tetracyclines & Related Nausea And Vomiting    Current Outpatient Medications  Medication Sig Dispense Refill  . acetaminophen (TYLENOL) 325 MG tablet Take 650 mg by mouth every 6 (six) hours as needed for moderate pain or headache.    Marland Kitchen aspirin-acetaminophen-caffeine (EXCEDRIN MIGRAINE) 250-250-65 MG tablet Take 2 tablets by mouth every 6 (six) hours as needed for headache.    . Camphor-Menthol-Capsicum (TIGER BALM PAIN RELIEVING EX) Apply 1 application topically daily as needed (for pain).    Raynald Blend EXTERNAL MEDICATION Take 2 tablets by mouth daily.    Marland Kitchen ibuprofen (ADVIL,MOTRIN) 200 MG tablet Take 400 mg by mouth every 6 (six) hours as needed for headache or moderate pain.    Marland Kitchen irbesartan (AVAPRO) 75 MG tablet Take 75 mg by mouth daily.    . Multiple Vitamin (MULTIVITAMIN WITH MINERALS) TABS tablet Take 1 tablet by mouth daily.     . naproxen sodium (ALEVE) 220 MG tablet Take 440 mg by mouth daily as needed (for pain).    Marland Kitchen OVER THE COUNTER MEDICATION Take 2 tablets by mouth daily. Fortify Supplement    . polyethylene glycol (MIRALAX / GLYCOLAX) packet Take 17 g by mouth daily as needed for moderate constipation.     . Probiotic Product (PROBIOTIC DAILY PO) Take 1 tablet by mouth as needed (for health).     . Simethicone  (PHAZYME) 180 MG CAPS Take 180-360 mg by mouth daily as needed (for gas).    . zaleplon (SONATA) 10 MG capsule Take 10 mg by mouth at bedtime as needed for sleep.     No current facility-administered medications for this visit.     OBJECTIVE: Middle-aged white woman in no acute distress  Vitals:   09/23/17 1556  BP: (!) 181/98  Pulse: 78  Resp: 18  Temp: (!) 97.5 F (36.4 C)  SpO2: 99%     Body mass index is 25.96 kg/m.   Wt Readings from Last 3 Encounters:  09/23/17 137 lb 6.4 oz (62.3 kg)  02/16/17 136 lb (61.7 kg)  11/25/14 136 lb (61.7 kg)      ECOG FS:0 - Asymptomatic  Ocular: Sclerae unicteric, pupils round and equal Ear-nose-throat: Oropharynx clear and moist Lymphatic: No cervical or supraclavicular adenopathy Lungs no rales or rhonchi Heart regular rate and rhythm Abd soft, nontender, positive bowel sounds MSK no focal spinal tenderness, no joint edema Neuro: non-focal, well-oriented, appropriate affect Breasts:    LAB RESULTS:  CMP     Component Value Date/Time   NA 139 09/23/2017 1539  K 3.7 09/23/2017 1539   CL 105 09/23/2017 1539   CO2 25 09/23/2017 1539   GLUCOSE 92 09/23/2017 1539   BUN 14 09/23/2017 1539   CREATININE 0.86 09/23/2017 1539   CALCIUM 9.2 09/23/2017 1539   PROT 7.8 09/23/2017 1539   ALBUMIN 4.3 09/23/2017 1539   AST 25 09/23/2017 1539   ALT 36 09/23/2017 1539   ALKPHOS 101 09/23/2017 1539   BILITOT 0.5 09/23/2017 1539   GFRNONAA >60 09/23/2017 1539   GFRAA >60 09/23/2017 1539    No results found for: TOTALPROTELP, ALBUMINELP, A1GS, A2GS, BETS, BETA2SER, GAMS, MSPIKE, SPEI  No results found for: KPAFRELGTCHN, LAMBDASER, KAPLAMBRATIO  Lab Results  Component Value Date   WBC 7.1 09/23/2017   NEUTROABS 3.1 09/23/2017   HGB 15.0 02/16/2017   HCT 42.6 09/23/2017   MCV 90.9 09/23/2017   PLT 256 09/23/2017    _0 @  No results found for: LABCA2  No components found for: SHFWYO378  No results for input(s):  INR in the last 168 hours.  No results found for: LABCA2  No results found for: HYI502  No results found for: DXA128  No results found for: NOM767  No results found for: CA2729  No components found for: HGQUANT  No results found for: CEA1 / No results found for: CEA1   No results found for: AFPTUMOR  No results found for: CHROMOGRNA  No results found for: PSA1  Appointment on 09/23/2017  Component Date Value Ref Range Status  . Sodium 09/23/2017 139  136 - 145 mmol/L Final  . Potassium 09/23/2017 3.7  3.5 - 5.1 mmol/L Final  . Chloride 09/23/2017 105  98 - 109 mmol/L Final  . CO2 09/23/2017 25  22 - 29 mmol/L Final  . Glucose, Bld 09/23/2017 92  70 - 140 mg/dL Final  . BUN 09/23/2017 14  7 - 26 mg/dL Final  . Creatinine 09/23/2017 0.86  0.60 - 1.10 mg/dL Final  . Calcium 09/23/2017 9.2  8.4 - 10.4 mg/dL Final  . Total Protein 09/23/2017 7.8  6.4 - 8.3 g/dL Final  . Albumin 09/23/2017 4.3  3.5 - 5.0 g/dL Final  . AST 09/23/2017 25  5 - 34 U/L Final  . ALT 09/23/2017 36  0 - 55 U/L Final  . Alkaline Phosphatase 09/23/2017 101  40 - 150 U/L Final  . Total Bilirubin 09/23/2017 0.5  0.2 - 1.2 mg/dL Final  . GFR, Est Non Af Am 09/23/2017 >60  >60 mL/min Final  . GFR, Est AFR Am 09/23/2017 >60  >60 mL/min Final   Comment: (NOTE) The eGFR has been calculated using the CKD EPI equation. This calculation has not been validated in all clinical situations. eGFR's persistently <60 mL/min signify possible Chronic Kidney Disease.   Georgiann Hahn gap 09/23/2017 9  3 - 11 Final   Performed at [facility], [place] [address] [place], [place] [address]  . WBC Count 09/23/2017 7.1  3.9 - 10.3 K/uL Final  . RBC 09/23/2017 4.69  3.70 - 5.45 MIL/uL Final  . Hemoglobin 09/23/2017 15.0  11.6 - 15.9 g/dL Final  . HCT 09/23/2017 42.6  34.8 - 46.6 % Final  . MCV 09/23/2017 90.9  79.5 - 101.0 fL Final  . MCH 09/23/2017 32.0  25.1 - 34.0 pg Final  . MCHC 09/23/2017 35.2  31.5  - 36.0 g/dL Final  . RDW 09/23/2017 12.7  11.2 - 14.5 % Final  . Platelet Count 09/23/2017 256  145 - 400 K/uL  Final  . Neutrophils Relative % 09/23/2017 44  % Final  . Neutro Abs 09/23/2017 3.1  1.5 - 6.5 K/uL Final  . Lymphocytes Relative 09/23/2017 47  % Final  . Lymphs Abs 09/23/2017 3.3  0.9 - 3.3 K/uL Final  . Monocytes Relative 09/23/2017 6  % Final  . Monocytes Absolute 09/23/2017 0.4  0.1 - 0.9 K/uL Final  . Eosinophils Relative 09/23/2017 2  % Final  . Eosinophils Absolute 09/23/2017 0.1  0.0 - 0.5 K/uL Final  . Basophils Relative 09/23/2017 1  % Final  . Basophils Absolute 09/23/2017 0.1  0.0 - 0.1 K/uL Final   Performed at [facility], [place] [address], [place], [place] [address]    (this displays the last labs from the last 3 days)  No results found for: TOTALPROTELP, ALBUMINELP, A1GS, A2GS, BETS, BETA2SER, GAMS, MSPIKE, SPEI (this displays SPEP labs)  No results found for: KPAFRELGTCHN, LAMBDASER, KAPLAMBRATIO (kappa/lambda light chains)  No results found for: HGBA, HGBA2QUANT, HGBFQUANT, HGBSQUAN (Hemoglobinopathy evaluation)   No results found for: LDH  No results found for: IRON, TIBC, IRONPCTSAT (Iron and TIBC)  No results found for: FERRITIN  Urinalysis No results found for: COLORURINE, APPEARANCEUR, LABSPEC, PHURINE, GLUCOSEU, HGBUR, BILIRUBINUR, KETONESUR, PROTEINUR, UROBILINOGEN, NITRITE, LEUKOCYTESUR   STUDIES: Mm Digital Diagnostic Unilat L  Result Date: 08/26/2017 CLINICAL DATA:  67 year old female recalled from screening mammogram dated 08/16/2017 for left breast calcifications. EXAM: DIGITAL DIAGNOSTIC LEFT MAMMOGRAM WITH CAD COMPARISON:  Previous exam(s). ACR Breast Density Category b: There are scattered areas of fibroglandular density. FINDINGS: Grouped calcifications in the upper-outer quadrant of the left breast at middle depth demonstrate a pleomorphic morphology. There are new from most recent comparison examination in  2017. Mammographic images were processed with CAD. IMPRESSION: Indeterminate left breast calcifications for which stereotactic biopsy is recommended. RECOMMENDATION: Stereotactic biopsy of left breast calcifications. I have discussed the findings and recommendations with the patient. Results were also provided in writing at the conclusion of the visit. If applicable, a reminder letter will be sent to the patient regarding the next appointment. BI-RADS CATEGORY  4: Suspicious. Electronically Signed   By: Kristopher Oppenheim M.D.   On: 08/26/2017 09:41   Mm Lt Radioactive Seed Loc Mammo Guide  Result Date: 09/20/2017 CLINICAL DATA:  67 year old patient with recent diagnosis of ductal carcinoma in situ following stereotactic biopsy. She declined placement of a biopsy marker clip at the time of biopsy. EXAM: MAMMOGRAPHIC GUIDED RADIOACTIVE SEED LOCALIZATION OF THE LEFT BREAST COMPARISON:  Previous exam(s). FINDINGS: Patient presents for radioactive seed localization prior to lumpectomy. I met with the patient and we discussed the procedure of seed localization including benefits and alternatives. We discussed the high likelihood of a successful procedure. We discussed the risks of the procedure including infection, bleeding, tissue injury and further surgery. We discussed the low dose of radioactivity involved in the procedure. Informed, written consent was given. The usual time-out protocol was performed immediately prior to the procedure. Using mammographic guidance, sterile technique, 1% lidocaine and an I-125 radioactive seed, residual calcifications, and a focal island of tissue within which multiple calcifications were present prior to biopsy was localized using a lateral approach. The follow-up mammogram images confirm the seed in the expected location and were marked for Dr. Ninfa Linden. Follow-up survey of the patient confirms presence of the radioactive seed. Order number of I-125 seed:  [id number]. Total activity:   2.952 millicuries reference Date: 10 September 2017 The patient tolerated the procedure well and was  released from the [place]. She was given instructions regarding seed removal. IMPRESSION: Radioactive seed localization left breast. No apparent complications. Electronically Signed   By: Curlene Dolphin M.D.   On: 09/20/2017 16:30   Mm Lt Breast Bx W Loc Dev 1st Lesion Image Bx Spec Stereo Guide  Addendum Date: 08/29/2017   ADDENDUM REPORT: 08/28/2017 15:14 ADDENDUM: Pathology revealed INTERMEDIATE GRADE DUCTAL CARCINOMA IN SITU, CALCIFICATIONS of the Left breast, upper outer. This was found to be concordant by Dr. Dorise Bullion. Pathology results were discussed with the patient by telephone. The patient reported doing well after the biopsy with tenderness at the site. Post biopsy instructions and care were reviewed and questions were answered. The patient was encouraged to call [facility] for any additional concerns. Surgical consultation has been arranged with Dr. Nedra Hai at [facility] on September 02, 2017. Pathology results reported by Terie Purser, RN on 08/28/2017. Electronically Signed   By: Dorise Bullion III M.D   On: 08/28/2017 15:14   Result Date: 08/29/2017 CLINICAL DATA:  Left breast calcifications EXAM: LEFT BREAST STEREOTACTIC CORE NEEDLE BIOPSY COMPARISON:  Previous exams. FINDINGS: The patient and I discussed the procedure of stereotactic-guided biopsy including benefits and alternatives. We discussed the high likelihood of a successful procedure. We discussed the risks of the procedure including infection, bleeding, tissue injury, clip migration, and inadequate sampling. Informed written consent was given. The usual time out protocol was performed immediately prior to the procedure. Using sterile technique and 1% Lidocaine as local anesthetic, under stereotactic guidance, a 9 gauge vacuum assisted device was used to perform core needle biopsy  of calcifications in the upper-outer left breast using a superior approach. Specimen radiograph was performed showing calcifications in 3 out of 6 specimens. Specimens with calcifications are identified for pathology. Lesion quadrant: Upper-outer A biopsy clip was not placed by patient request. IMPRESSION: Stereotactic-guided biopsy of left breast calcifications. No apparent complications. Electronically Signed: By: Dorise Bullion III M.D On: 08/27/2017 11:12    ELIGIBLE FOR AVAILABLE RESEARCH PROTOCOL: Decided against the COMET trial  ASSESSMENT: 67 y.o. [place], [place] Woman  (1) s/p left breast upper outer quadrant biopsy 08/27/2017 for ductal carcinoma in situ, grade 2, estrogen and progesterone receptor positive  (2) breast conserving surgery with no sentinel lymph node sampling scheduled 09/25/2017  (3) adjuvant radiation to follow  (4) consider antiestrogens at the completion of local treatment  PLAN: We spent the better part of today's hour-long appointment discussing the biology of her diagnosis and the specifics of her situation. Shavelle understands that in noninvasive ductal carcinoma, also called ductal carcinoma in situ ("DCIS") the breast cancer cells remain trapped in the ducts were they started. They cannot travel to a vital organ. For that reason these cancers in themselves are not life-threatening.  If the whole breast is removed then all the ducts are removed and since the cancer cells are trapped in the ducts, the cure rate with mastectomy for noninvasive breast cancer is approximately 99%. Nevertheless we recommend lumpectomy, because there is no survival advantage to mastectomy and because the cosmetic result is generally superior with breast conservation.  Since the patient is keeping her breast, there will be some risk of recurrence. The recurrence can only be in the same breast since, again, the cells are trapped in the ducts. There is no connection from one breast to the  other. The risk of local recurrence is cut by more than half with radiation, which is  standard in this situation.  In estrogen receptor positive cancers like Marionette's, anti-estrogens can also be considered. They will further reduce the risk of recurrence by one half. In addition anti-estrogens will lower the risk of a new breast cancer developing in either breast, also by one half. That risk approaches 1% per year without anti-estrogen treatment  We discussed the COMET trial.  Emorie is not interested in participating.  She just "wants it out".  We also reviewed her family background.  She does not meet criteria for genetics testing.  Accordingly the overall plan is for surgery, followed by radiation, then a discussion of anti-estrogens.  Delonda has a good understanding of the overall plan. She agrees with it. She knows the goal of treatment in her case is cure. She will call with any problems that may develop before her next visit here.   Isabela Nardelli, Virgie Dad, MD  09/23/17 4:43 PM Medical Oncology and Hematology [facility] [address] [place], [place] [address] Tel. [phone]    Fax. [phone]  This document serves as a record of services personally performed by Lurline Del, MD. It was created on his behalf by Sheron Nightingale, a trained medical scribe. The creation of this record is based on the scribe's personal observations and the provider's statements to them.   I have reviewed the above documentation for accuracy and completeness, and I agree with the above.

## 2017-09-20 ENCOUNTER — Ambulatory Visit
Admission: RE | Admit: 2017-09-20 | Discharge: 2017-09-20 | Disposition: A | Discharge status: Home / Self Care | Payer: Medicare HMO | Source: Ambulatory Visit | Attending: Surgery | Admitting: Surgery

## 2017-09-20 ENCOUNTER — Other Ambulatory Visit: Payer: Self-pay

## 2017-09-20 DIAGNOSIS — D0512 Intraductal carcinoma in situ of left breast: Secondary | ICD-10-CM | POA: Diagnosis not present

## 2017-09-20 DIAGNOSIS — C50919 Malignant neoplasm of unspecified site of unspecified female breast: Secondary | ICD-10-CM

## 2017-09-22 DIAGNOSIS — Z170 Estrogen receptor positive status [ER+]: Secondary | ICD-10-CM

## 2017-09-22 DIAGNOSIS — C50412 Malignant neoplasm of upper-outer quadrant of left female breast: Secondary | ICD-10-CM | POA: Insufficient documentation

## 2017-09-22 DIAGNOSIS — D0512 Intraductal carcinoma in situ of left breast: Secondary | ICD-10-CM | POA: Insufficient documentation

## 2017-09-23 ENCOUNTER — Inpatient Hospital Stay: Payer: Medicare HMO | Attending: Oncology | Admitting: Oncology

## 2017-09-23 ENCOUNTER — Inpatient Hospital Stay: Payer: Medicare HMO

## 2017-09-23 DIAGNOSIS — Z7982 Long term (current) use of aspirin: Secondary | ICD-10-CM | POA: Insufficient documentation

## 2017-09-23 DIAGNOSIS — Z803 Family history of malignant neoplasm of breast: Secondary | ICD-10-CM | POA: Diagnosis not present

## 2017-09-23 DIAGNOSIS — C50919 Malignant neoplasm of unspecified site of unspecified female breast: Secondary | ICD-10-CM

## 2017-09-23 DIAGNOSIS — Z170 Estrogen receptor positive status [ER+]: Secondary | ICD-10-CM

## 2017-09-23 DIAGNOSIS — R948 Abnormal results of function studies of other organs and systems: Secondary | ICD-10-CM | POA: Diagnosis not present

## 2017-09-23 DIAGNOSIS — D0512 Intraductal carcinoma in situ of left breast: Secondary | ICD-10-CM | POA: Diagnosis not present

## 2017-09-23 DIAGNOSIS — I10 Essential (primary) hypertension: Secondary | ICD-10-CM | POA: Diagnosis not present

## 2017-09-23 DIAGNOSIS — G479 Sleep disorder, unspecified: Secondary | ICD-10-CM | POA: Diagnosis not present

## 2017-09-23 DIAGNOSIS — Z809 Family history of malignant neoplasm, unspecified: Secondary | ICD-10-CM | POA: Insufficient documentation

## 2017-09-23 DIAGNOSIS — E785 Hyperlipidemia, unspecified: Secondary | ICD-10-CM | POA: Diagnosis not present

## 2017-09-23 DIAGNOSIS — K589 Irritable bowel syndrome without diarrhea: Secondary | ICD-10-CM | POA: Insufficient documentation

## 2017-09-23 DIAGNOSIS — E781 Pure hyperglyceridemia: Secondary | ICD-10-CM | POA: Insufficient documentation

## 2017-09-23 DIAGNOSIS — C50412 Malignant neoplasm of upper-outer quadrant of left female breast: Secondary | ICD-10-CM

## 2017-09-23 DIAGNOSIS — Z79899 Other long term (current) drug therapy: Secondary | ICD-10-CM | POA: Diagnosis not present

## 2017-09-23 LAB — CMP (CANCER CENTER ONLY)
ALK PHOS: 101 U/L (ref 40–150)
ALT: 36 U/L (ref 0–55)
ANION GAP: 9 (ref 3–11)
AST: 25 U/L (ref 5–34)
Albumin: 4.3 g/dL (ref 3.5–5.0)
BILIRUBIN TOTAL: 0.5 mg/dL (ref 0.2–1.2)
BUN: 14 mg/dL (ref 7–26)
CALCIUM: 9.2 mg/dL (ref 8.4–10.4)
CO2: 25 mmol/L (ref 22–29)
Chloride: 105 mmol/L (ref 98–109)
Creatinine: 0.86 mg/dL (ref 0.60–1.10)
GFR, Estimated: >60 mL/min (ref >60)
Glucose, Bld: 92 mg/dL (ref 70–140)
Potassium: 3.7 mmol/L (ref 3.5–5.1)
SODIUM: 139 mmol/L (ref 136–145)
TOTAL PROTEIN: 7.8 g/dL (ref 6.4–8.3)

## 2017-09-23 LAB — CBC WITH DIFFERENTIAL (CANCER CENTER ONLY)
Basophils Absolute: 0.1 10*3/uL (ref 0.0–0.1)
Basophils Relative: 1 %
Eosinophils Absolute: 0.1 10*3/uL (ref 0.0–0.5)
Eosinophils Relative: 2 %
HCT: 42.6 % (ref 34.8–46.6)
Hemoglobin: 15 g/dL (ref 11.6–15.9)
LYMPHS ABS: 3.3 10*3/uL (ref 0.9–3.3)
LYMPHS PCT: 47 %
MCH: 32 pg (ref 25.1–34.0)
MCHC: 35.2 g/dL (ref 31.5–36.0)
MCV: 90.9 fL (ref 79.5–101.0)
MONO ABS: 0.4 10*3/uL (ref 0.1–0.9)
MONOS PCT: 6 %
Neutro Abs: 3.1 10*3/uL (ref 1.5–6.5)
Neutrophils Relative %: 44 %
PLATELETS: 256 10*3/uL (ref 145–400)
RBC: 4.69 MIL/uL (ref 3.70–5.45)
RDW: 12.7 % (ref 11.2–14.5)
WBC Count: 7.1 10*3/uL (ref 3.9–10.3)

## 2017-09-23 NOTE — Pre-Procedure Instructions (Addendum)
[name]  09/23/2017      [id number] - [name], [name] - [name] [name] [address] Phone: [phone] Fax: [phone]    Your procedure is scheduled on  Wednesday 09/25/17  Report to [facility] Admitting at 630 A.M.  Call this number if you have problems the morning of surgery:  [phone]   Remember:  Do not eat food or drink liquids after midnight.               DRINK PRE- SURGERY ENSURE DRINK BEFORE LEAVING HOUSE.   Take these medicines the morning of surgery with A SIP OF WATER  - gaviscon if needed or simethicone  7 days prior to surgery STOP taking any Aspirin(unless otherwise instructed by your surgeon), Aleve, Naproxen, Ibuprofen, Motrin, Advil, Goody's, BC's, all herbal medications, fish oil, and all vitamins   Do not wear jewelry, make-up or nail polish.  Do not wear lotions, powders, or perfumes, or deodorant.  Do not shave 48 hours prior to surgery.  Men may shave face and neck.  Do not bring valuables to the hospital.  [facility] is not responsible for any belongings or valuables.  Contacts, dentures or bridgework may not be worn into surgery.  Leave your suitcase in the car.  After surgery it may be brought to your room.  For patients admitted to the hospital, discharge time will be determined by your treatment team.  Patients discharged the day of surgery will not be allowed to drive home.   Name and phone number of your driver:    Special instructions:  [facility] - Preparing for Surgery  Before surgery, you can play an important role.  Because skin is not sterile, your skin needs to be as free of germs as possible.  You can reduce the number of germs on you skin by washing with CHG (chlorahexidine gluconate) soap before surgery.  CHG is an antiseptic cleaner which kills germs and bonds with the skin to continue killing germs even after washing.  Please DO NOT use if you have an allergy to  CHG or antibacterial soaps.  If your skin becomes reddened/irritated stop using the CHG and inform your nurse when you arrive at Short Stay.  Do not shave (including legs and underarms) for at least 48 hours prior to the first CHG shower.  You may shave your face.  Please follow these instructions carefully:   1.  Shower with CHG Soap the night before surgery and the                                morning of Surgery.  2.  If you choose to wash your hair, wash your hair first as usual with your       normal shampoo.  3.  After you shampoo, rinse your hair and body thoroughly to remove the                      Shampoo.  4.  Use CHG as you would any other liquid soap.  You can apply chg directly       to the skin and wash gently with scrungie or a clean washcloth.  5.  Apply the CHG Soap to your body ONLY FROM THE NECK DOWN.        Do not use on open wounds or open  sores.  Avoid contact with your eyes,       ears, mouth and genitals (private parts).  Wash genitals (private parts)       with your normal soap.  6.  Wash thoroughly, paying special attention to the area where your surgery        will be performed.  7.  Thoroughly rinse your body with warm water from the neck down.  8.  DO NOT shower/wash with your normal soap after using and rinsing off       the CHG Soap.  9.  Pat yourself dry with a clean towel.            10.  Wear clean pajamas.            11.  Place clean sheets on your bed the night of your first shower and do not        sleep with pets.  Day of Surgery  Do not apply any lotions/deoderants the morning of surgery.  Please wear clean clothes to the hospital/surgery center.    Please read over the following fact sheets that you were given. Pain Booklet

## 2017-09-24 ENCOUNTER — Other Ambulatory Visit: Payer: Self-pay

## 2017-09-24 ENCOUNTER — Telehealth: Payer: Self-pay | Admitting: Oncology

## 2017-09-24 ENCOUNTER — Ambulatory Visit: Payer: Medicare HMO

## 2017-09-24 ENCOUNTER — Encounter (HOSPITAL_COMMUNITY)
Admission: RE | Admit: 2017-09-24 | Discharge: 2017-09-24 | Disposition: A | Discharge status: Home / Self Care | Payer: Medicare HMO | Source: Ambulatory Visit | Attending: Surgery | Admitting: Surgery

## 2017-09-24 ENCOUNTER — Encounter (HOSPITAL_COMMUNITY): Payer: Self-pay

## 2017-09-24 ENCOUNTER — Ambulatory Visit: Payer: Medicare HMO | Admitting: Radiation Oncology

## 2017-09-24 DIAGNOSIS — Z9889 Other specified postprocedural states: Secondary | ICD-10-CM | POA: Diagnosis not present

## 2017-09-24 DIAGNOSIS — D0512 Intraductal carcinoma in situ of left breast: Secondary | ICD-10-CM

## 2017-09-24 DIAGNOSIS — M1990 Unspecified osteoarthritis, unspecified site: Secondary | ICD-10-CM | POA: Diagnosis not present

## 2017-09-24 DIAGNOSIS — Z79899 Other long term (current) drug therapy: Secondary | ICD-10-CM | POA: Diagnosis not present

## 2017-09-24 DIAGNOSIS — Z803 Family history of malignant neoplasm of breast: Secondary | ICD-10-CM | POA: Diagnosis not present

## 2017-09-24 DIAGNOSIS — Z7982 Long term (current) use of aspirin: Secondary | ICD-10-CM | POA: Diagnosis not present

## 2017-09-24 DIAGNOSIS — R69 Illness, unspecified: Secondary | ICD-10-CM | POA: Diagnosis not present

## 2017-09-24 DIAGNOSIS — I10 Essential (primary) hypertension: Secondary | ICD-10-CM | POA: Diagnosis not present

## 2017-09-24 DIAGNOSIS — Z9102 Food additives allergy status: Secondary | ICD-10-CM | POA: Diagnosis not present

## 2017-09-24 DIAGNOSIS — Z791 Long term (current) use of non-steroidal anti-inflammatories (NSAID): Secondary | ICD-10-CM | POA: Diagnosis not present

## 2017-09-24 DIAGNOSIS — Z800 Family history of malignant neoplasm of digestive organs: Secondary | ICD-10-CM | POA: Diagnosis not present

## 2017-09-24 DIAGNOSIS — Z881 Allergy status to other antibiotic agents status: Secondary | ICD-10-CM | POA: Diagnosis not present

## 2017-09-24 DIAGNOSIS — D0502 Lobular carcinoma in situ of left breast: Secondary | ICD-10-CM | POA: Diagnosis not present

## 2017-09-24 DIAGNOSIS — Z811 Family history of alcohol abuse and dependence: Secondary | ICD-10-CM | POA: Diagnosis not present

## 2017-09-24 HISTORY — DX: Depression, unspecified: F32.A

## 2017-09-24 HISTORY — DX: Unspecified osteoarthritis, unspecified site: M19.90

## 2017-09-24 HISTORY — DX: Adverse effect of unspecified anesthetic, initial encounter: T41.45XA

## 2017-09-24 HISTORY — DX: Dyskinesia of esophagus: K22.4

## 2017-09-24 HISTORY — DX: Other complications of anesthesia, initial encounter: T88.59XA

## 2017-09-24 HISTORY — DX: Headache, unspecified: R51.9

## 2017-09-24 HISTORY — DX: Malignant (primary) neoplasm, unspecified: C80.1

## 2017-09-24 HISTORY — DX: Other specified postprocedural states: Z98.890

## 2017-09-24 HISTORY — DX: Headache: R51

## 2017-09-24 HISTORY — DX: Other specified postprocedural states: R11.2

## 2017-09-24 HISTORY — DX: Nausea with vomiting, unspecified: R11.2

## 2017-09-24 HISTORY — DX: Major depressive disorder, single episode, unspecified: F32.9

## 2017-09-24 NOTE — Anesthesia Preprocedure Evaluation (Addendum)
Anesthesia Evaluation  Patient identified by MRN, date of birth, ID band Patient awake    Reviewed: Allergy & Precautions, H&P , NPO status , Patient's Chart, lab work & pertinent test results  History of Anesthesia Complications (+) PONV  Airway Mallampati: III  TM Distance: >3 FB Neck ROM: Full    Dental no notable dental hx. (+) Teeth Intact, Dental Advisory Given   Pulmonary neg pulmonary ROS,    Pulmonary exam normal breath sounds clear to auscultation       Cardiovascular Exercise Tolerance: Good hypertension, Pt. on medications  Rhythm:Regular Rate:Normal     Neuro/Psych  Headaches, Depression    GI/Hepatic negative GI ROS, Neg liver ROS,   Endo/Other  negative endocrine ROS  Renal/GU negative Renal ROS  negative genitourinary   Musculoskeletal  (+) Arthritis , Osteoarthritis,    Abdominal   Peds  Hematology negative hematology ROS (+)   Anesthesia Other Findings   Reproductive/Obstetrics negative OB ROS                            Anesthesia Physical Anesthesia Plan  ASA: II  Anesthesia Plan: General   Post-op Pain Management:    Induction: Intravenous  PONV Risk Score and Plan: 4 or greater and Ondansetron, Dexamethasone, Midazolam and Scopolamine patch - Pre-op  Airway Management Planned: LMA  Additional Equipment:   Intra-op Plan:   Post-operative Plan: Extubation in OR  Informed Consent: I have reviewed the patients History and Physical, chart, labs and discussed the procedure including the risks, benefits and alternatives for the proposed anesthesia with the patient or authorized representative who has indicated his/her understanding and acceptance.   Dental advisory given  Plan Discussed with: CRNA  Anesthesia Plan Comments:         Anesthesia Quick Evaluation

## 2017-09-24 NOTE — H&P (Signed)
[name] Documented: 09/02/2017 2:46 PM Location: [facility] Patient #: [id number] DOB: [name] Married / Language: [name] / Race: White Female   History of Present Illness ([name] A. Ninfa Linden MD; 09/02/2017 3:34 PM) The patient is a 67 year old female who presents with breast cancer. This patient is referred by Dr. Dorise Lane after the recent diagnosis of ductal carcinoma in situ of the left breast. This was found on screening mammography. She had calcifications in the upper outer quadrant left breast. Biopsy showed DCIS. No invasive cancer was seen. She has had no previous problems regarding her breasts. Her mother died of breast cancer in her 43s. She is otherwise healthy without complaints.   Past Surgical History [name], [name]; 09/02/2017 2:46 PM) Breast Biopsy  Left. Cesarean Section - 1  Knee Surgery  Right. Mammoplasty; Reduction  Bilateral. Oral Surgery  Shoulder Surgery  Right. Tonsillectomy   Diagnostic Studies History [name], [name]; 09/02/2017 2:46 PM) Colonoscopy  within last year Mammogram  within last year Pap Smear  1-5 years ago  Allergies [name], RMA; 09/02/2017 2:52 PM) No Known Drug Allergies [09/02/2017]: (Marked as Inactive) Allergies Reconciled  Tetracycline HCl *TETRACYCLINES*  Keflex *CEPHALOSPORINS*  Yeast Extract *CHEMICALS*  Glycerin *CHEMICALS*   Medication History [name], RMA; 09/02/2017 2:50 PM) Zaleplon (10MG  Capsule, Oral) Active. Fluzone High-Dose (0.5ML Susp Pref Syr, Intramuscular) Active. MiraLax (Oral) Active. Multivitamin Adult (Oral) Active. Probiotic Daily (Oral) Active. Glucosamine MSM Complex (Oral) Active. Medications Reconciled  Social History [name], [name]; 09/02/2017 2:46 PM) Alcohol use  Remotely quit alcohol use. Caffeine use  Coffee, Tea. No drug use  Tobacco use  Never smoker.  Family History [name], [name]; 09/02/2017 2:46  PM) Alcohol Abuse  Father. Anesthetic complications  Son. Breast Cancer  Mother. Colon Cancer  Father.  Pregnancy / Birth History [name], [name]; 09/02/2017 2:46 PM) Age at menarche  25 years. Age of menopause  51-55 Contraceptive History  Intrauterine device, Oral contraceptives. Gravida  2 Length (months) of breastfeeding  12-24 Maternal age  70-30 Para  1  Other Problems [name], [name]; 09/02/2017 2:46 PM) Arthritis  Breast Cancer  Chest pain  Depression  Diverticulosis  General anesthesia - complications  Hemorrhoids  High blood pressure     Review of Systems [name] RMA; 09/02/2017 2:46 PM) General Present- Fatigue. Not Present- Appetite Loss, Chills, Fever, Night Sweats, Weight Gain and Weight Loss. Skin Not Present- Change in Wart/Mole, Dryness, Hives, Jaundice, New Lesions, Non-Healing Wounds, Rash and Ulcer. HEENT Present- Wears glasses/contact lenses. Not Present- Earache, Hearing Loss, Hoarseness, Nose Bleed, Oral Ulcers, Ringing in the Ears, Seasonal Allergies, Sinus Pain, Sore Throat, Visual Disturbances and Yellow Eyes. Respiratory Not Present- Bloody sputum, Chronic Cough, Difficulty Breathing, Snoring and Wheezing. Breast Present- Breast Mass and Breast Pain. Not Present- Nipple Discharge and Skin Changes. Cardiovascular Not Present- Chest Pain, Difficulty Breathing Lying Down, Leg Cramps, Palpitations, Rapid Heart Rate, Shortness of Breath and Swelling of Extremities. Gastrointestinal Present- Abdominal Pain, Constipation, Difficulty Swallowing, Excessive gas, Hemorrhoids and Indigestion. Not Present- Bloating, Bloody Stool, Change in Bowel Habits, Chronic diarrhea, Gets full quickly at meals, Nausea, Rectal Pain and Vomiting. Female Genitourinary Not Present- Frequency, Nocturia, Painful Urination, Pelvic Pain and Urgency. Musculoskeletal Present- Joint Pain. Not Present- Back Pain, Joint Stiffness, Muscle Pain, Muscle Weakness  and Swelling of Extremities. Neurological Present- Headaches. Not Present- Decreased Memory, Fainting, Numbness, Seizures, Tingling, Tremor, Trouble walking and Weakness. Psychiatric Not Present- Anxiety, Bipolar, Change in Sleep Pattern,  Depression, Fearful and Frequent crying. Endocrine Not Present- Cold Intolerance, Excessive Hunger, Hair Changes, Heat Intolerance, Hot flashes and New Diabetes. Hematology Not Present- Blood Thinners, Easy Bruising, Excessive bleeding, Gland problems, HIV and Persistent Infections.  Vitals [name] RMA; 09/02/2017 2:47 PM) 09/02/2017 2:46 PM Weight: 138.4 lb Height: 61in Body Surface Area: 1.62 m Body Mass Index: 26.15 kg/m  Temp.: 98.84F  Pulse: 96 (Regular)  BP: 150/80 (Sitting, Left Arm, Standard)       Physical Exam (Lori Laver A. Ninfa Linden MD; 09/02/2017 3:35 PM) General Mental Status-Alert. General Appearance-Consistent with stated age. Hydration-Well hydrated. Voice-Normal.  Head and Neck Head-normocephalic, atraumatic with no lesions or palpable masses. Trachea-midline. Thyroid Gland Characteristics - normal size and consistency.  Eye Eyeball - Bilateral-Extraocular movements intact. Sclera/Conjunctiva - Bilateral-No scleral icterus.  Chest and Lung Exam Chest and lung exam reveals -quiet, even and easy respiratory effort with no use of accessory muscles and on auscultation, normal breath sounds, no adventitious sounds and normal vocal resonance. Inspection Chest Wall - Normal. Back - normal.  Breast Breast - Left-Symmetric, Non Tender, No Biopsy scars, no Dimpling, No Inflammation, No Lumpectomy scars, No Mastectomy scars, No Peau d' Orange. Breast - Right-Symmetric, Non Tender, No Biopsy scars, no Dimpling, No Inflammation, No Lumpectomy scars, No Mastectomy scars, No Peau d' Orange. Breast Lump-No Palpable Breast Mass. Note: There is some ecchymosis of the left breast. I believe I can  palpate a small hematoma at her biopsy site in the upper outer quadrant of the left breast. She has had bilateral breast reduction   Cardiovascular Cardiovascular examination reveals -normal heart sounds, regular rate and rhythm with no murmurs and normal pedal pulses bilaterally.  Abdomen - Did not examine.  Neurologic - Did not examine.  Musculoskeletal - Did not examine.  Lymphatic Head & Neck  General Head & Neck Lymphatics: Bilateral - Description - Normal. Axillary  General Axillary Region: Bilateral - Description - Normal. Tenderness - Non Tender. Femoral & Inguinal  Generalized Femoral & Inguinal Lymphatics: Bilateral - Description - Normal. Tenderness - Non Tender.    Assessment & Plan (Trella Thurmond A. Ninfa Linden MD; 09/02/2017 3:36 PM) DUCTAL CARCINOMA IN SITU (DCIS) OF LEFT BREAST (D05.12) Impression: This is in the ER and PR positive left breast ductal carcinoma in situ. I discussed the diagnosis in detail with the patient and her husband. I gave him a copy of the pathology results. We discussed breast cancer in detail. We discussed breast conservation surgery versus mastectomy. We discussed postoperative radiation if she chooses breast conservation. I discussed a radioactive CT-guided left breast partial mastectomy with her in detail. She wished to proceed with breast conservation. We will refer her to both medical and radiation oncology. We will be discussing her and her cancer conference as to determine whether or not she needs a preoperative MRI. I discussed the risk of surgery with her in detail. These risks include but are not limited to bleeding, infection, injury to surrounding structures, the need for further surgery if invasive cancer is found or there are positive margins, cardiopulmonary issues, etc. We discussed postoperative recovery. She understands and wished to proceed with surgery

## 2017-09-24 NOTE — Telephone Encounter (Signed)
Per 2/11 no los 

## 2017-09-25 ENCOUNTER — Encounter (HOSPITAL_COMMUNITY)
Admission: RE | Disposition: A | Discharge status: Home / Self Care | Payer: Self-pay | Source: Ambulatory Visit | Attending: Surgery

## 2017-09-25 ENCOUNTER — Ambulatory Visit (HOSPITAL_COMMUNITY)
Admission: RE | Admit: 2017-09-25 | Discharge: 2017-09-25 | Disposition: A | Discharge status: Home / Self Care | Payer: Medicare HMO | Source: Ambulatory Visit | Attending: Surgery | Admitting: Surgery

## 2017-09-25 ENCOUNTER — Ambulatory Visit
Admission: RE | Admit: 2017-09-25 | Discharge: 2017-09-25 | Disposition: A | Discharge status: Home / Self Care | Payer: Medicare HMO | Source: Ambulatory Visit | Attending: Surgery | Admitting: Surgery

## 2017-09-25 ENCOUNTER — Ambulatory Visit (HOSPITAL_COMMUNITY): Payer: Medicare HMO | Admitting: Anesthesiology

## 2017-09-25 ENCOUNTER — Encounter (HOSPITAL_COMMUNITY): Payer: Self-pay | Admitting: Urology

## 2017-09-25 DIAGNOSIS — M1990 Unspecified osteoarthritis, unspecified site: Secondary | ICD-10-CM | POA: Diagnosis not present

## 2017-09-25 DIAGNOSIS — Z9889 Other specified postprocedural states: Secondary | ICD-10-CM | POA: Diagnosis not present

## 2017-09-25 DIAGNOSIS — Z7982 Long term (current) use of aspirin: Secondary | ICD-10-CM | POA: Diagnosis not present

## 2017-09-25 DIAGNOSIS — Z803 Family history of malignant neoplasm of breast: Secondary | ICD-10-CM | POA: Insufficient documentation

## 2017-09-25 DIAGNOSIS — Z811 Family history of alcohol abuse and dependence: Secondary | ICD-10-CM | POA: Insufficient documentation

## 2017-09-25 DIAGNOSIS — D0502 Lobular carcinoma in situ of left breast: Secondary | ICD-10-CM | POA: Insufficient documentation

## 2017-09-25 DIAGNOSIS — Z9102 Food additives allergy status: Secondary | ICD-10-CM | POA: Insufficient documentation

## 2017-09-25 DIAGNOSIS — Z791 Long term (current) use of non-steroidal anti-inflammatories (NSAID): Secondary | ICD-10-CM | POA: Insufficient documentation

## 2017-09-25 DIAGNOSIS — Z79899 Other long term (current) drug therapy: Secondary | ICD-10-CM | POA: Diagnosis not present

## 2017-09-25 DIAGNOSIS — Z800 Family history of malignant neoplasm of digestive organs: Secondary | ICD-10-CM | POA: Insufficient documentation

## 2017-09-25 DIAGNOSIS — Z881 Allergy status to other antibiotic agents status: Secondary | ICD-10-CM | POA: Diagnosis not present

## 2017-09-25 DIAGNOSIS — D0512 Intraductal carcinoma in situ of left breast: Secondary | ICD-10-CM

## 2017-09-25 DIAGNOSIS — C50412 Malignant neoplasm of upper-outer quadrant of left female breast: Secondary | ICD-10-CM | POA: Diagnosis not present

## 2017-09-25 DIAGNOSIS — R079 Chest pain, unspecified: Secondary | ICD-10-CM | POA: Diagnosis not present

## 2017-09-25 DIAGNOSIS — I10 Essential (primary) hypertension: Secondary | ICD-10-CM | POA: Insufficient documentation

## 2017-09-25 DIAGNOSIS — R928 Other abnormal and inconclusive findings on diagnostic imaging of breast: Secondary | ICD-10-CM | POA: Diagnosis not present

## 2017-09-25 HISTORY — PX: BREAST LUMPECTOMY WITH RADIOACTIVE SEED LOCALIZATION: SHX6424

## 2017-09-25 SURGERY — BREAST LUMPECTOMY WITH RADIOACTIVE SEED LOCALIZATION
Anesthesia: General | Site: Breast | Laterality: Left

## 2017-09-25 MED ORDER — ONDANSETRON HCL 4 MG/2ML IJ SOLN
INTRAMUSCULAR | Status: AC
  Filled 2017-09-25: qty 2

## 2017-09-25 MED ORDER — HYDROMORPHONE HCL 1 MG/ML IJ SOLN: 0.2500 mg | INTRAMUSCULAR | Status: DC | PRN

## 2017-09-25 MED ORDER — 0.9 % SODIUM CHLORIDE (POUR BTL) OPTIME
TOPICAL | Status: DC | PRN
  Administered 2017-09-25: 1000 mL

## 2017-09-25 MED ORDER — MIDAZOLAM HCL 5 MG/5ML IJ SOLN
INTRAMUSCULAR | Status: DC | PRN
  Administered 2017-09-25: 2 mg via INTRAVENOUS

## 2017-09-25 MED ORDER — PROPOFOL 10 MG/ML IV BOLUS
INTRAVENOUS | Status: DC | PRN
  Administered 2017-09-25: 80 mg via INTRAVENOUS

## 2017-09-25 MED ORDER — PROPOFOL 10 MG/ML IV BOLUS
INTRAVENOUS | Status: AC
  Filled 2017-09-25: qty 20

## 2017-09-25 MED ORDER — GABAPENTIN 300 MG PO CAPS
300.0000 mg | ORAL_CAPSULE | ORAL | Status: AC
  Administered 2017-09-25: 300 mg via ORAL
  Filled 2017-09-25: qty 1

## 2017-09-25 MED ORDER — PHENYLEPHRINE 40 MCG/ML (10ML) SYRINGE FOR IV PUSH (FOR BLOOD PRESSURE SUPPORT)
PREFILLED_SYRINGE | INTRAVENOUS | Status: DC | PRN
  Administered 2017-09-25 (×2): 80 ug via INTRAVENOUS

## 2017-09-25 MED ORDER — ONDANSETRON HCL 4 MG/2ML IJ SOLN
INTRAMUSCULAR | Status: DC | PRN
  Administered 2017-09-25: 4 mg via INTRAVENOUS

## 2017-09-25 MED ORDER — SCOPOLAMINE 1 MG/3DAYS TD PT72
MEDICATED_PATCH | TRANSDERMAL | Status: AC
  Filled 2017-09-25: qty 1

## 2017-09-25 MED ORDER — FENTANYL CITRATE (PF) 100 MCG/2ML IJ SOLN
INTRAMUSCULAR | Status: DC | PRN
  Administered 2017-09-25: 25 ug via INTRAVENOUS
  Administered 2017-09-25: 50 ug via INTRAVENOUS

## 2017-09-25 MED ORDER — LIDOCAINE 2% (20 MG/ML) 5 ML SYRINGE
INTRAMUSCULAR | Status: DC | PRN
  Administered 2017-09-25: 60 mg via INTRAVENOUS

## 2017-09-25 MED ORDER — CIPROFLOXACIN IN D5W 400 MG/200ML IV SOLN
400.0000 mg | INTRAVENOUS | Status: AC
  Administered 2017-09-25: 400 mg via INTRAVENOUS
  Filled 2017-09-25: qty 200

## 2017-09-25 MED ORDER — TRAMADOL HCL 50 MG PO TABS: 50.0000 mg | ORAL_TABLET | Freq: Four times a day (QID) | ORAL | 0 refills | Status: DC | PRN

## 2017-09-25 MED ORDER — DEXAMETHASONE SODIUM PHOSPHATE 10 MG/ML IJ SOLN
INTRAMUSCULAR | Status: AC
  Filled 2017-09-25: qty 1

## 2017-09-25 MED ORDER — BUPIVACAINE-EPINEPHRINE 0.25% -1:200000 IJ SOLN
INTRAMUSCULAR | Status: DC | PRN
  Administered 2017-09-25: 16 mL

## 2017-09-25 MED ORDER — LIDOCAINE 2% (20 MG/ML) 5 ML SYRINGE
INTRAMUSCULAR | Status: AC
  Filled 2017-09-25: qty 5

## 2017-09-25 MED ORDER — CHLORHEXIDINE GLUCONATE CLOTH 2 % EX PADS: 6.0000 | MEDICATED_PAD | Freq: Once | CUTANEOUS | Status: DC

## 2017-09-25 MED ORDER — ACETAMINOPHEN 500 MG PO TABS
1000.0000 mg | ORAL_TABLET | ORAL | Status: AC
  Administered 2017-09-25: 1000 mg via ORAL
  Filled 2017-09-25: qty 2

## 2017-09-25 MED ORDER — MIDAZOLAM HCL 2 MG/2ML IJ SOLN
INTRAMUSCULAR | Status: AC
  Filled 2017-09-25: qty 2

## 2017-09-25 MED ORDER — FENTANYL CITRATE (PF) 250 MCG/5ML IJ SOLN
INTRAMUSCULAR | Status: AC
  Filled 2017-09-25: qty 5

## 2017-09-25 MED ORDER — LACTATED RINGERS IV SOLN
INTRAVENOUS | Status: DC | PRN
  Administered 2017-09-25: 08:00:00 via INTRAVENOUS

## 2017-09-25 MED ORDER — BUPIVACAINE-EPINEPHRINE 0.25% -1:200000 IJ SOLN
INTRAMUSCULAR | Status: AC
  Filled 2017-09-25: qty 1

## 2017-09-25 MED ORDER — DEXAMETHASONE SODIUM PHOSPHATE 10 MG/ML IJ SOLN
INTRAMUSCULAR | Status: DC | PRN
  Administered 2017-09-25: 10 mg via INTRAVENOUS

## 2017-09-25 SURGICAL SUPPLY — 42 items
APPLIER CLIP 9.375 MED OPEN (MISCELLANEOUS) ×2
BINDER BREAST LRG (GAUZE/BANDAGES/DRESSINGS) IMPLANT
BINDER BREAST XLRG (GAUZE/BANDAGES/DRESSINGS) ×4 IMPLANT
BLADE SURG 15 STRL LF DISP TIS (BLADE) ×1 IMPLANT
BLADE SURG 15 STRL SS (BLADE) ×1
CANISTER SUCT 3000ML PPV (MISCELLANEOUS) ×2 IMPLANT
CHLORAPREP W/TINT 26ML (MISCELLANEOUS) ×2 IMPLANT
CLIP APPLIE 9.375 MED OPEN (MISCELLANEOUS) ×1 IMPLANT
COVER PROBE W GEL 5X96 (DRAPES) ×2 IMPLANT
COVER SURGICAL LIGHT HANDLE (MISCELLANEOUS) ×2 IMPLANT
DERMABOND ADVANCED (GAUZE/BANDAGES/DRESSINGS) ×1
DERMABOND ADVANCED .7 DNX12 (GAUZE/BANDAGES/DRESSINGS) ×1 IMPLANT
DEVICE DUBIN SPECIMEN MAMMOGRA (MISCELLANEOUS) ×2 IMPLANT
DRAPE CHEST BREAST 15X10 FENES (DRAPES) ×2 IMPLANT
DRAPE UTILITY XL STRL (DRAPES) ×2 IMPLANT
ELECT CAUTERY BLADE 6.4 (BLADE) ×2 IMPLANT
ELECT REM PT RETURN 9FT ADLT (ELECTROSURGICAL) ×2
ELECTRODE REM PT RTRN 9FT ADLT (ELECTROSURGICAL) ×1 IMPLANT
GAUZE SPONGE 4X4 12PLY STRL (GAUZE/BANDAGES/DRESSINGS) ×2 IMPLANT
GLOVE BIOGEL PI IND STRL 8 (GLOVE) ×1 IMPLANT
GLOVE BIOGEL PI INDICATOR 8 (GLOVE) ×1
GLOVE ECLIPSE 8.0 STRL XLNG CF (GLOVE) ×2 IMPLANT
GLOVE SURG SIGNA 7.5 PF LTX (GLOVE) ×2 IMPLANT
GOWN STRL REUS W/ TWL LRG LVL3 (GOWN DISPOSABLE) ×1 IMPLANT
GOWN STRL REUS W/ TWL XL LVL3 (GOWN DISPOSABLE) ×1 IMPLANT
GOWN STRL REUS W/TWL LRG LVL3 (GOWN DISPOSABLE) ×1
GOWN STRL REUS W/TWL XL LVL3 (GOWN DISPOSABLE) ×1
KIT BASIN OR (CUSTOM PROCEDURE TRAY) ×2 IMPLANT
KIT MARKER MARGIN INK (KITS) ×2 IMPLANT
NEEDLE HYPO 25GX1X1/2 BEV (NEEDLE) ×2 IMPLANT
NS IRRIG 1000ML POUR BTL (IV SOLUTION) IMPLANT
PACK SURGICAL SETUP 50X90 (CUSTOM PROCEDURE TRAY) ×2 IMPLANT
PENCIL BUTTON HOLSTER BLD 10FT (ELECTRODE) ×2 IMPLANT
SPONGE LAP 18X18 X RAY DECT (DISPOSABLE) ×2 IMPLANT
SUT MNCRL AB 4-0 PS2 18 (SUTURE) ×2 IMPLANT
SUT VIC AB 3-0 SH 18 (SUTURE) ×2 IMPLANT
SYR BULB 3OZ (MISCELLANEOUS) ×2 IMPLANT
SYR CONTROL 10ML LL (SYRINGE) ×2 IMPLANT
TOWEL OR 17X24 6PK STRL BLUE (TOWEL DISPOSABLE) ×2 IMPLANT
TOWEL OR 17X26 10 PK STRL BLUE (TOWEL DISPOSABLE) ×2 IMPLANT
TUBE CONNECTING 12X1/4 (SUCTIONS) ×2 IMPLANT
YANKAUER SUCT BULB TIP NO VENT (SUCTIONS) ×2 IMPLANT

## 2017-09-25 NOTE — Discharge Instructions (Signed)
[place] Office Phone Number [phone]  BREAST BIOPSY/ PARTIAL MASTECTOMY: POST OP INSTRUCTIONS  Always review your discharge instruction sheet given to you by the facility where your surgery was performed.  IF YOU HAVE DISABILITY OR FAMILY LEAVE FORMS, YOU MUST BRING THEM TO THE OFFICE FOR PROCESSING.  DO NOT GIVE THEM TO YOUR DOCTOR.  1. A prescription for pain medication may be given to you upon discharge.  Take your pain medication as prescribed, if needed.  If narcotic pain medicine is not needed, then you may take acetaminophen (Tylenol) or ibuprofen (Advil) as needed. 2. Take your usually prescribed medications unless otherwise directed 3. If you need a refill on your pain medication, please contact your pharmacy.  They will contact our office to request authorization.  Prescriptions will not be filled after 5pm or on week-ends. 4. You should eat very light the first 24 hours after surgery, such as soup, crackers, pudding, etc.  Resume your normal diet the day after surgery. 5. Most patients will experience some swelling and bruising in the breast.  Ice packs and a good support bra will help.  Swelling and bruising can take several days to resolve.  6. It is common to experience some constipation if taking pain medication after surgery.  Increasing fluid intake and taking a stool softener will usually help or prevent this problem from occurring.  A mild laxative (Milk of Magnesia or Miralax) should be taken according to package directions if there are no bowel movements after 48 hours. 7. Unless discharge instructions indicate otherwise, you may remove your bandages 24-48 hours after surgery, and you may shower at that time.  You may have steri-strips (small skin tapes) in place directly over the incision.  These strips should be left on the skin for 7-10 days.  If your surgeon used skin glue on the incision, you may shower in 24 hours.  The glue will flake off over the  next 2-3 weeks.  Any sutures or staples will be removed at the office during your follow-up visit. 8. ACTIVITIES:  You may resume regular daily activities (gradually increasing) beginning the next day.  Wearing a good support bra or sports bra minimizes pain and swelling.  You may have sexual intercourse when it is comfortable. a. You may drive when you no longer are taking prescription pain medication, you can comfortably wear a seatbelt, and you can safely maneuver your car and apply brakes. b. RETURN TO WORK:  ______________________________________________________________________________________ 9. You should see your doctor in the office for a follow-up appointment approximately two weeks after your surgery.  Your doctors nurse will typically make your follow-up appointment when she calls you with your pathology report.  Expect your pathology report 2-3 business days after your surgery.  You may call to check if you do not hear from [place] after three days. 10. OTHER INSTRUCTIONS: _OK TO REMOVE BINDER TOMORROW AND SHOWER.  YOU MAY CONTINUE TO WEAR THE BINDER IF YOU WANT TO FOR COMFORT 11. TYLENOL, ICE PACK, IBUPROFEN ALSO FOR PAIN 12. ______________________________________________________________________________________________ _____________________________________________________________________________________________________________________________________ _____________________________________________________________________________________________________________________________________ _____________________________________________________________________________________________________________________________________  WHEN TO CALL YOUR DOCTOR: 1. Fever over 101.0 2. Nausea and/or vomiting. 3. Extreme swelling or bruising. 4. Continued bleeding from incision. 5. Increased pain, redness, or drainage from the incision.  The clinic staff is available to answer your questions during regular  business hours.  Please dont hesitate to call and ask to speak to one of the nurses for clinical concerns.  If you have a medical emergency, go  to the nearest emergency room or call 911.  A surgeon from [facility] is always on call at the hospital.  For further questions, please visit [email or web address]

## 2017-09-25 NOTE — Interval H&P Note (Signed)
History and Physical Interval Note:no change in H and P  09/25/2017 7:24 AM  [name]  has presented today for surgery, with the diagnosis of left breast DCIS  The various methods of treatment have been discussed with the patient and family. After consideration of risks, benefits and other options for treatment, the patient has consented to  Procedure(s): LEFT BREAST PARTIAL MASTECTOMY WITH RADIOACTIVE SEED [place] (Left) as a surgical intervention .  The patient's history has been reviewed, patient examined, no change in status, stable for surgery.  I have reviewed the patient's chart and labs.  Questions were answered to the patient's satisfaction.     Dereke Neumann A

## 2017-09-25 NOTE — Anesthesia Postprocedure Evaluation (Signed)
Anesthesia Post Note  Patient: [name]  Procedure(s) Performed: LEFT BREAST PARTIAL MASTECTOMY WITH RADIOACTIVE SEED LOCALIZATION [facility] (Left Breast)     Patient location during evaluation: PACU Anesthesia Type: General Level of consciousness: awake and alert Pain management: pain level controlled Vital Signs Assessment: post-procedure vital signs reviewed and stable Respiratory status: spontaneous breathing, nonlabored ventilation and respiratory function stable Cardiovascular status: blood pressure returned to baseline and stable Postop Assessment: no apparent nausea or vomiting Anesthetic complications: no    Last Vitals:  Vitals:   09/25/17 0930 09/25/17 0945  BP: 123/73 124/80  Pulse: 79 77  Resp: 12 15  Temp:    SpO2: 96% 96%    Last Pain:  Vitals:   09/25/17 0930  TempSrc:   PainSc: 0-No pain                 Jacari Iannello,W. EDMOND

## 2017-09-25 NOTE — Transfer of Care (Signed)
Immediate Anesthesia Transfer of Care Note  Patient: [name]  Procedure(s) Performed: LEFT BREAST PARTIAL MASTECTOMY WITH RADIOACTIVE SEED LOCALIZATION [facility] (Left Breast)  Patient Location: PACU  Anesthesia Type:General  Level of Consciousness: awake, alert  and oriented  Airway & Oxygen Therapy: Patient Spontanous Breathing and Patient connected to nasal cannula oxygen  Post-op Assessment: Report given to RN, Post -op Vital signs reviewed and stable and Patient moving all extremities  Post vital signs: Reviewed and stable  Last Vitals:  Vitals:   09/25/17 0727  BP: (!) 157/86  Pulse: 85  Resp: 18  Temp: (!) 36.3 C  SpO2: 97%    Last Pain:  Vitals:   09/25/17 0727  TempSrc: Oral  PainSc:          Complications: No apparent anesthesia complications

## 2017-09-25 NOTE — Op Note (Signed)
LEFT BREAST PARTIAL MASTECTOMY WITH RADIOACTIVE SEED LOCALIZATION [facility]  Procedure Note  [name] 09/25/2017   Pre-op Diagnosis: left breast DCIS     Post-op Diagnosis: SAME  Procedure(s): LEFT BREAST PARTIAL MASTECTOMY WITH RADIOACTIVE SEED LOCALIZATION [facility]  Surgeon(s): Coralie Keens, MD  Anesthesia: General  Staff:  Circulator: Hal Morales, RN Scrub Person: Adella Hare Circulator Assistant: Nicholos Johns, RN  Estimated Blood Loss: Minimal               Specimens: SENT TO PATH  Indications: This is a 67 year old female who was found to have ductal carcinoma in situ of the left breast after a biopsy of abnormal calcifications in the upper outer quadrant of the right breast.  The decision was made to proceed with a radioactive seed guided left breast partial mastectomy  Procedure: The patient was brought to the operating room and identified as the correct patient.  She was placed supine on the operating room table and general anesthesia was induced.  Her left breast was then prepped and draped in the usual sterile fashion.  I anesthetized the lateral edge of the areola at a previous scar with Marcaine.  I then made a circumareolar incision with a scalpel.  Using the neoprobe, I dissected into the left upper quadrant of the breast.  I stay widely around the right proceed with the aid of the neoprobe and performed a partial mastectomy including the upper outer quadrant of the breast.  Once the specimen was removed, I marked all margins with marker paint.  An x-ray was performed confirming that the radioactive seed was in the specimen.  The specimen was then sent to pathology for evaluation.  Hemostasis was achieved with the cautery.  I placed surgical into the cavity for marker purposes.  I injected further Marcaine into the incision.  I then closed the subcutaneous tissue with interrupted 3-0 Vicryl sutures and closed the skin with a running 4-0 Monocryl.   Dermabond and a breast binder was then applied.  The patient tolerated the procedure well.  All the counts were correct at the end of the procedure.  The patient was then extubated in the operating room and taken in a stable condition to the recovery room.          [name] A   Date: 09/25/2017  Time: 9:03 AM

## 2017-09-25 NOTE — Anesthesia Procedure Notes (Signed)
Procedure Name: LMA Insertion Date/Time: 09/25/2017 8:29 AM Performed by: Kyung Rudd, CRNA Pre-anesthesia Checklist: Patient identified, Emergency Drugs available, Suction available and Patient being monitored Patient Re-evaluated:Patient Re-evaluated prior to induction Oxygen Delivery Method: Circle system utilized Preoxygenation: Pre-oxygenation with 100% oxygen Induction Type: IV induction LMA: LMA inserted LMA Size: 4.0 Number of attempts: 1 Placement Confirmation: positive ETCO2 and breath sounds checked- equal and bilateral Dental Injury: Teeth and Oropharynx as per pre-operative assessment

## 2017-09-26 ENCOUNTER — Encounter (HOSPITAL_COMMUNITY): Payer: Self-pay | Admitting: Surgery

## 2017-10-08 NOTE — Progress Notes (Signed)
Location of Breast Cancer: Left Breast  Histology per Pathology Report:  08/27/2017 Left Breast upper outer quadrant   Receptor Status: ER(+ 95%), PR (+ 90%), Her2-neu (), Ki-()  Did patient present with symptoms (if so, please note symptoms) or was this found on screening mammography?: Routine mammogram 08/17/2017.  Past/Anticipated interventions by surgeon, if any: Left breast partial mastectomy with radioactive seed localization 09/25/2017- Dr. Ninfa Linden  Past/Anticipated interventions by medical oncology, if any: Patient seen in breast clinic by Dr. Jana Hakim 09/23/2017.  Plan for breast conserving surgery without sentinel lymph node sampling with consideration of the COMET trial.  Lymphedema issues, if any:    Pain issues, if any:  Patient states 4/10 pain over the last week in her incision area, back, arm pit, and shoulder area behind the breast.   BP (!) 150/99 (BP Location: Right Arm, Patient Position: Sitting, Cuff Size: Normal)   Pulse 73   Temp 97.8 F (36.6 C) (Oral)   Resp 18   Ht '5\' 1"'  (1.549 m)   Wt 136 lb 6.4 oz (61.9 kg)   SpO2 100%   BMI 25.77 kg/m    Wt Readings from Last 3 Encounters:  10/10/17 136 lb 6.4 oz (61.9 kg)  09/24/17 137 lb 6.4 oz (62.3 kg)  09/23/17 137 lb 6.4 oz (62.3 kg)   SAFETY ISSUES:  Prior radiation? No  Pacemaker/ICD? No  Possible current pregnancy? No  Is the patient on methotrexate? No  Current Complaints / other details:  Pt states main concern is pain in left shoulder blade behind the breast.    Cori Razor, RN 10/08/2017,3:55 PM

## 2017-10-10 ENCOUNTER — Encounter: Payer: Self-pay | Admitting: Radiation Oncology

## 2017-10-10 ENCOUNTER — Ambulatory Visit
Admission: RE | Admit: 2017-10-10 | Discharge: 2017-10-10 | Disposition: A | Discharge status: Home / Self Care | Payer: Medicare HMO | Source: Ambulatory Visit | Attending: Radiation Oncology | Admitting: Radiation Oncology

## 2017-10-10 ENCOUNTER — Other Ambulatory Visit: Payer: Self-pay

## 2017-10-10 VITALS — BP 150/99 | HR 73 | Temp 97.8°F | Resp 18 | Ht 61.0 in | Wt 136.4 lb

## 2017-10-10 DIAGNOSIS — I10 Essential (primary) hypertension: Secondary | ICD-10-CM | POA: Diagnosis not present

## 2017-10-10 DIAGNOSIS — Z79899 Other long term (current) drug therapy: Secondary | ICD-10-CM | POA: Insufficient documentation

## 2017-10-10 DIAGNOSIS — M1990 Unspecified osteoarthritis, unspecified site: Secondary | ICD-10-CM | POA: Insufficient documentation

## 2017-10-10 DIAGNOSIS — Z809 Family history of malignant neoplasm, unspecified: Secondary | ICD-10-CM | POA: Insufficient documentation

## 2017-10-10 DIAGNOSIS — E781 Pure hyperglyceridemia: Secondary | ICD-10-CM | POA: Insufficient documentation

## 2017-10-10 DIAGNOSIS — R948 Abnormal results of function studies of other organs and systems: Secondary | ICD-10-CM | POA: Diagnosis not present

## 2017-10-10 DIAGNOSIS — Z803 Family history of malignant neoplasm of breast: Secondary | ICD-10-CM | POA: Insufficient documentation

## 2017-10-10 DIAGNOSIS — D0512 Intraductal carcinoma in situ of left breast: Secondary | ICD-10-CM | POA: Diagnosis not present

## 2017-10-10 DIAGNOSIS — Z170 Estrogen receptor positive status [ER+]: Secondary | ICD-10-CM | POA: Diagnosis not present

## 2017-10-10 DIAGNOSIS — K224 Dyskinesia of esophagus: Secondary | ICD-10-CM | POA: Diagnosis not present

## 2017-10-10 DIAGNOSIS — Z9889 Other specified postprocedural states: Secondary | ICD-10-CM | POA: Diagnosis not present

## 2017-10-10 DIAGNOSIS — F329 Major depressive disorder, single episode, unspecified: Secondary | ICD-10-CM | POA: Diagnosis not present

## 2017-10-10 DIAGNOSIS — R69 Illness, unspecified: Secondary | ICD-10-CM | POA: Diagnosis not present

## 2017-10-10 DIAGNOSIS — K589 Irritable bowel syndrome without diarrhea: Secondary | ICD-10-CM | POA: Diagnosis not present

## 2017-10-10 DIAGNOSIS — E785 Hyperlipidemia, unspecified: Secondary | ICD-10-CM | POA: Insufficient documentation

## 2017-10-10 DIAGNOSIS — C50412 Malignant neoplasm of upper-outer quadrant of left female breast: Secondary | ICD-10-CM

## 2017-10-10 NOTE — Progress Notes (Addendum)
Radiation Oncology         (336) [phone] ________________________________  Name: [name]        MRN: [id number]  Date of Service: 10/10/2017 DOB: 04-27-1951  HK:[id number], Lori Butts, MD  Coralie Keens, MD     REFERRING PHYSICIAN: Coralie Keens, MD   DIAGNOSIS: The primary encounter diagnosis was Ductal carcinoma in situ (DCIS) of left breast. A diagnosis of Malignant neoplasm of upper-outer quadrant of left breast in female, estrogen receptor positive ([place]) was also pertinent to this visit.   HISTORY OF PRESENT ILLNESS: [name] is a 67 y.o. female seen at the request of Dr. Jana Hakim for a new diagnosis of left breast cancer. The patient was found to have an abnormality in the left breast on screening mammogram. She underwent diagnostic imaging and this revealed calcifications in the upper outer quadrant of the left breast. A biopsy on 1/115/19 revealed intermediate grade DCIS with calcifications, ER/PR positive. She underwent lumpectomy on 08/27/17 revealing a 2.1 cm intermediate grade DCIS with LCIS and negative margins. She comes today to discuss options of treatment.  PREVIOUS RADIATION THERAPY: No   PAST MEDICAL HISTORY:  Past Medical History:  Diagnosis Date  . Abnormal liver function test   . Arthritis    knees, hands & hips, back   . Cancer ([place])    DCIS- L breast  . Complication of anesthesia   . Depression    post partum, also related to her in her 27's  . Dyslipidemia   . Esophageal spasm    rare occurence , /w ? reflux, no treatment or med. thus far  . Headache    tension , sporadic   . Hypertension   . Hypertriglyceridemia   . IBS (irritable bowel syndrome)   . PONV (postoperative nausea and vomiting)   . Sleep disturbance        PAST SURGICAL HISTORY: Past Surgical History:  Procedure Laterality Date  . BREAST LUMPECTOMY WITH RADIOACTIVE SEED LOCALIZATION Left 09/25/2017   Procedure: LEFT BREAST PARTIAL MASTECTOMY WITH RADIOACTIVE SEED  LOCALIZATION ERAS PATHWAY;  Surgeon: Coralie Keens, MD;  Location: [facility];  Service: General;  Laterality: Left;  . BREAST REDUCTION SURGERY    . CARDIOVASCULAR STRESS TEST    . CESAREAN SECTION  1984  . KNEE ARTHROSCOPY Right 2004  . REDUCTION MAMMAPLASTY Bilateral 1997  . ROOT CANAL    . SHOULDER SURGERY Right    2012- arthroscopy  . TONSILLECTOMY       FAMILY HISTORY:  Family History  Problem Relation Age of Onset  . Cancer Mother   . Breast cancer Mother 38  . Cancer Father      SOCIAL HISTORY:  reports that  has never smoked. she has never used smokeless tobacco. She reports that she does not drink alcohol or use drugs.   ALLERGIES: Yeast; Fenofibrate; Glycerine [glycerin]; Keflex [cephalexin]; and Tetracyclines & related   MEDICATIONS:  Current Outpatient Medications  Medication Sig Dispense Refill  . acetaminophen (TYLENOL) 325 MG tablet Take 650 mg by mouth every 6 (six) hours as needed for moderate pain or headache.    Marland Kitchen aspirin-acetaminophen-caffeine (EXCEDRIN MIGRAINE) 250-250-65 MG tablet Take 2 tablets by mouth every 6 (six) hours as needed for headache.    . Camphor-Menthol-Capsicum (TIGER BALM PAIN RELIEVING EX) Apply 1 application topically daily as needed (for pain).    Raynald Blend EXTERNAL MEDICATION Take 2 tablets by mouth daily.    Marland Kitchen ibuprofen (ADVIL,MOTRIN) 200 MG tablet Take 400 mg  by mouth every 6 (six) hours as needed for headache or moderate pain.    Marland Kitchen irbesartan (AVAPRO) 75 MG tablet Take 75 mg by mouth daily.    . Multiple Vitamin (MULTIVITAMIN WITH MINERALS) TABS tablet Take 1 tablet by mouth daily.     . naproxen sodium (ALEVE) 220 MG tablet Take 440 mg by mouth daily as needed (for pain).    Marland Kitchen OVER THE COUNTER MEDICATION Take 2 tablets by mouth daily. Fortify Supplement    . polyethylene glycol (MIRALAX / GLYCOLAX) packet Take 17 g by mouth daily as needed for moderate constipation.     . Probiotic Product (PROBIOTIC DAILY PO) Take 1 tablet by  mouth as needed (for health).     . Simethicone (PHAZYME) 180 MG CAPS Take 180-360 mg by mouth daily as needed (for gas).    . zaleplon (SONATA) 10 MG capsule Take 10 mg by mouth at bedtime as needed for sleep.    . traMADol (ULTRAM) 50 MG tablet Take 1-2 tablets (50-100 mg total) by mouth every 6 (six) hours as needed. (Patient not taking: Reported on 10/10/2017) 15 tablet 0   No current facility-administered medications for this encounter.      REVIEW OF SYSTEMS: On review of systems, the patient reports that she is doing well overall. She denies any chest pain, shortness of breath, cough, fevers, chills, night sweats, unintended weight changes. She denies any bowel or bladder disturbances, and denies abdominal pain, nausea or vomiting. She denies any new musculoskeletal or joint aches or pains. A complete review of systems is obtained and is otherwise negative.     PHYSICAL EXAM:  Wt Readings from Last 3 Encounters:  10/10/17 136 lb 6.4 oz (61.9 kg)  09/24/17 137 lb 6.4 oz (62.3 kg)  09/23/17 137 lb 6.4 oz (62.3 kg)   Temp Readings from Last 3 Encounters:  10/10/17 97.8 F (36.6 C) (Oral)  09/25/17 (!) 97.4 F (36.3 C)  09/24/17 98.1 F (36.7 C)   BP Readings from Last 3 Encounters:  10/10/17 (!) 150/99  09/25/17 (!) 142/86  09/24/17 (!) 165/84   Pulse Readings from Last 3 Encounters:  10/10/17 73  09/25/17 87  09/24/17 80   Pain Assessment Pain Score: 0-No pain/10  In general this is a well appearing caucasian female in no acute distress. She is alert and oriented x4 and appropriate throughout the examination. HEENT reveals that the patient is normocephalic, atraumatic. EOMs are intact. PERRLA. Skin is intact without any evidence of gross lesions. Cardiovascular exam reveals a regular rate and rhythm, no clicks rubs or murmurs are auscultated. Chest is clear to auscultation bilaterally. Lymphatic assessment is performed and does not reveal any adenopathy in the cervical,  supraclavicular, axillary, or inguinal chains. Abdomen has active bowel sounds in all quadrants and is intact. The abdomen is soft, non tender, non distended. Lower extremities are negative for pretibial pitting edema, deep calf tenderness, cyanosis or clubbing. The left breast is examined and reveals no erythema or fullness deep to her lumpectomy site.    ECOG = 0  0 - Asymptomatic (Fully active, able to carry on all predisease activities without restriction)  1 - Symptomatic but completely ambulatory (Restricted in physically strenuous activity but ambulatory and able to carry out work of a light or sedentary nature. For example, light housework, office work)  2 - Symptomatic, <50% in bed during the day (Ambulatory and capable of all self care but unable to carry out any work activities. Up  and about more than 50% of waking hours)  3 - Symptomatic, >50% in bed, but not bedbound (Capable of only limited self-care, confined to bed or chair 50% or more of waking hours)  4 - Bedbound (Completely disabled. Cannot carry on any self-care. Totally confined to bed or chair)  5 - Death   Eustace Pen MM, Creech RH, Tormey DC, et al. [phone]). "Toxicity and response criteria of the [facility]". [place] Oncol. 5 (6): 649-55    LABORATORY DATA:  Lab Results  Component Value Date   WBC 7.1 09/23/2017   HGB 15.0 02/16/2017   HCT 42.6 09/23/2017   MCV 90.9 09/23/2017   PLT 256 09/23/2017   Lab Results  Component Value Date   NA 139 09/23/2017   K 3.7 09/23/2017   CL 105 09/23/2017   CO2 25 09/23/2017   Lab Results  Component Value Date   ALT 36 09/23/2017   AST 25 09/23/2017   ALKPHOS 101 09/23/2017   BILITOT 0.5 09/23/2017      RADIOGRAPHY: Mm Breast Surgical Specimen  Result Date: 09/25/2017 CLINICAL DATA:  Evaluate specimen EXAM: SPECIMEN RADIOGRAPH OF THE LEFT BREAST COMPARISON:  Previous exam(s). FINDINGS: Status post excision of the left breast. The  radioactive seed is present, completely intact, and were marked for pathology. The radioactive seed is within the specimen. A biopsy clip was not left at the time of stereotactic biopsy per patient request. IMPRESSION: Specimen radiograph of the left breast. Electronically Signed   By: Dorise Bullion III M.D   On: 09/25/2017 08:54   Mm Lt Radioactive Seed Loc Mammo Guide  Result Date: 09/20/2017 CLINICAL DATA:  67 year old patient with recent diagnosis of ductal carcinoma in situ following stereotactic biopsy. She declined placement of a biopsy marker clip at the time of biopsy. EXAM: MAMMOGRAPHIC GUIDED RADIOACTIVE SEED LOCALIZATION OF THE LEFT BREAST COMPARISON:  Previous exam(s). FINDINGS: Patient presents for radioactive seed localization prior to lumpectomy. I met with the patient and we discussed the procedure of seed localization including benefits and alternatives. We discussed the high likelihood of a successful procedure. We discussed the risks of the procedure including infection, bleeding, tissue injury and further surgery. We discussed the low dose of radioactivity involved in the procedure. Informed, written consent was given. The usual time-out protocol was performed immediately prior to the procedure. Using mammographic guidance, sterile technique, 1% lidocaine and an I-125 radioactive seed, residual calcifications, and a focal island of tissue within which multiple calcifications were present prior to biopsy was localized using a lateral approach. The follow-up mammogram images confirm the seed in the expected location and were marked for Dr. Ninfa Linden. Follow-up survey of the patient confirms presence of the radioactive seed. Order number of I-125 seed:  [id number]. Total activity:  1.324 millicuries reference Date: 10 September 2017 The patient tolerated the procedure well and was released from the [facility]. She was given instructions regarding seed removal. IMPRESSION: Radioactive seed  localization left breast. No apparent complications. Electronically Signed   By: Curlene Dolphin M.D.   On: 09/20/2017 16:30       IMPRESSION/PLAN: 1. ER/PR positive intermediate grade DCIS of the left breast. Dr. Lisbeth Renshaw discusses the pathology findings and reviews the nature of non invasive breast disease. We  external radiotherapy to the breast followed by antiestrogen therapy and outlined the rationale for this. We discussed the risks, benefits, short, and long term effects of radiotherapy, and the patient is interested in proceeding. Dr. Lisbeth Renshaw discusses  the delivery and logistics of radiotherapy and anticipates a course of 4 weeks. Written consent is obtained and placed in the chart, a copy was provided to the patient. She will be contacted for simulation in the next week.   The above documentation reflects my direct findings during this shared patient visit. Please see the separate note by Dr. Lisbeth Renshaw on this date for the remainder of the patient's plan of care.    Carola Rhine, PAC

## 2017-10-14 NOTE — Addendum Note (Signed)
Encounter addended by: Hayden Pedro, PA-C on: 10/14/2017 6:54 AM  Actions taken: Sign clinical note

## 2017-10-18 ENCOUNTER — Ambulatory Visit
Admission: RE | Admit: 2017-10-18 | Discharge: 2017-10-18 | Disposition: A | Discharge status: Home / Self Care | Payer: Medicare HMO | Source: Ambulatory Visit | Attending: Radiation Oncology | Admitting: Radiation Oncology

## 2017-10-18 DIAGNOSIS — D0512 Intraductal carcinoma in situ of left breast: Secondary | ICD-10-CM | POA: Diagnosis not present

## 2017-10-18 DIAGNOSIS — Z510 Encounter for antineoplastic radiation therapy: Secondary | ICD-10-CM | POA: Insufficient documentation

## 2017-10-18 NOTE — Progress Notes (Signed)
  Radiation Oncology         [phone]) [phone] ________________________________  Name: MARYCATHERINE MANISCALCO MRN: [id number]  Date: 10/18/2017  DOB: 16-Aug-1950  Optical Surface Tracking Plan:  Since intensity modulated radiotherapy (IMRT) and 3D conformal radiation treatment methods are predicated on accurate and precise positioning for treatment, intrafraction motion monitoring is medically necessary to ensure accurate and safe treatment delivery.  The ability to quantify intrafraction motion without excessive ionizing radiation dose can only be performed with optical surface tracking. Accordingly, surface imaging offers the opportunity to obtain 3D measurements of patient position throughout IMRT and 3D treatments without excessive radiation exposure.  I am ordering optical surface tracking for this patient's upcoming course of radiotherapy. ________________________________  Kyung Rudd, MD 10/18/2017 5:16 PM    Reference:   Ursula Alert, J, et al. Surface imaging-based analysis of intrafraction motion for breast radiotherapy patients.Journal of [place], n. 6, nov. 2014. ISSN [id number].   Available at: <http://www.jacmp.org/index.php/jacmp/article/view/4957>.

## 2017-10-18 NOTE — Progress Notes (Addendum)
  Radiation Oncology         (336) [phone] ________________________________  Name: [name] MRN: [id number]  Date: 10/18/2017  DOB: 20-Mar-1951   DIAGNOSIS:     ICD-10-CM   1. Ductal carcinoma in situ (DCIS) of left breast D05.12     SIMULATION AND TREATMENT PLANNING NOTE  The patient presented for simulation prior to beginning her course of radiation treatment for her diagnosis of left-sided breast cancer. The patient was placed in a supine position on a breast board. A customized vac-lock bag was constructed and this complex treatment device will be used on a daily basis during her treatment. In this fashion, a CT scan was obtained through the chest area and an isocenter was placed near the chest wall within the breast.  The patient will be planned to receive a course of radiation initially to a dose of 42.5 Gy. This will consist of a whole breast radiotherapy technique. To accomplish this, 2 customized blocks have been designed which will correspond to medial and lateral whole breast tangent fields. This treatment will be accomplished at 2.5 Gy per fraction. A forward planning technique will also be evaluated to determine if this approach improves the plan. It is anticipated that the patient will then receive a 7.5 Gy boost to the seroma cavity which has been contoured. This will be accomplished at 2.5 Gy per fraction.   This initial treatment will consist of a 3-D conformal technique. The seroma has been contoured as the primary target structure. Additionally, dose volume histograms of both this target as well as the lungs and heart will also be evaluated. Such an approach is necessary to ensure that the target area is adequately covered while the nearby critical  normal structures are adequately spared.  Plan:  The final anticipated total dose therefore will correspond to 50 Gy.  Special treatment procedure was performed today due to the extra time and effort required by myself to  plan and prepare this patient for deep inspiration breath hold technique.  I have determined cardiac sparing to be of benefit to this patient to prevent long term cardiac damage due to radiation of the heart.  Bellows were placed on the patient's abdomen. To facilitate cardiac sparing, the patient was coached by the radiation therapists on breath hold techniques and breathing practice was performed. Practice waveforms were obtained. The patient was then scanned while maintaining breath hold in the treatment position.  This image was then transferred over to the imaging specialist. The imaging specialist then created a fusion of the free breathing and breath hold scans using the chest wall as the stable structure. I personally reviewed the fusion in axial, coronal and sagittal image planes.  Excellent cardiac sparing was obtained.  I felt the patient is an appropriate candidate for breath hold and the patient will be treated as such.  The image fusion was then reviewed with the patient to reinforce the necessity of reproducible breath hold.     _______________________________   Jodelle Gross, MD, PhD

## 2017-10-24 ENCOUNTER — Telehealth: Payer: Self-pay | Admitting: *Deleted

## 2017-10-24 DIAGNOSIS — Z510 Encounter for antineoplastic radiation therapy: Secondary | ICD-10-CM | POA: Diagnosis not present

## 2017-10-24 DIAGNOSIS — D0512 Intraductal carcinoma in situ of left breast: Secondary | ICD-10-CM | POA: Diagnosis not present

## 2017-10-24 NOTE — Telephone Encounter (Signed)
This RN returned call per VM from pt stating " I am returning a call from Dr Jana Hakim " - note per inquiry MD did not contact pt.  Obtained identified VM- message left informing pt of above - call may have been from our scheduling department or other team member. Requested return call if she has further concerns.

## 2017-10-25 ENCOUNTER — Ambulatory Visit: Payer: Medicare HMO | Admitting: Radiation Oncology

## 2017-10-28 ENCOUNTER — Ambulatory Visit: Payer: Medicare HMO

## 2017-10-28 ENCOUNTER — Ambulatory Visit: Payer: Medicare HMO | Admitting: Radiation Oncology

## 2017-10-29 ENCOUNTER — Ambulatory Visit: Payer: Medicare HMO

## 2017-10-30 ENCOUNTER — Ambulatory Visit: Payer: Medicare HMO

## 2017-10-31 ENCOUNTER — Ambulatory Visit: Payer: Medicare HMO

## 2017-10-31 ENCOUNTER — Encounter: Payer: Self-pay | Admitting: Radiation Oncology

## 2017-10-31 NOTE — Progress Notes (Signed)
Financial Counselor--Called patient, left her a voice mail message asking if she had any more questions regarding her radiation treatment cost--also told her she could call me back if she would like to reschedule her appointments

## 2017-11-01 ENCOUNTER — Ambulatory Visit: Payer: Medicare HMO

## 2017-11-04 ENCOUNTER — Ambulatory Visit: Payer: Medicare HMO

## 2017-11-05 ENCOUNTER — Ambulatory Visit: Payer: Medicare HMO

## 2017-11-05 ENCOUNTER — Other Ambulatory Visit: Payer: Self-pay | Admitting: Oncology

## 2017-11-05 NOTE — Progress Notes (Unsigned)
[name] underwent left lumpectomy on 09/25/2017 showing (SZ a [phone]) ductal carcinoma in situ, grade 2, measuring 2.1 cm.  Margins were negative.  She has decided against adjuvant radiation.

## 2017-11-06 ENCOUNTER — Ambulatory Visit: Payer: Medicare HMO

## 2017-11-07 ENCOUNTER — Ambulatory Visit: Payer: Medicare HMO

## 2017-11-08 ENCOUNTER — Ambulatory Visit: Payer: Medicare HMO

## 2017-11-09 ENCOUNTER — Ambulatory Visit: Payer: Medicare HMO

## 2017-11-11 ENCOUNTER — Ambulatory Visit: Payer: Medicare HMO

## 2017-11-12 ENCOUNTER — Encounter: Payer: Self-pay | Admitting: Radiation Oncology

## 2017-11-12 ENCOUNTER — Ambulatory Visit: Payer: Medicare HMO

## 2017-11-13 ENCOUNTER — Ambulatory Visit: Payer: Medicare HMO

## 2017-11-14 ENCOUNTER — Ambulatory Visit: Payer: Medicare HMO

## 2017-11-15 ENCOUNTER — Ambulatory Visit: Payer: Medicare HMO | Admitting: Radiation Oncology

## 2017-11-15 ENCOUNTER — Ambulatory Visit: Payer: Medicare HMO

## 2017-11-18 ENCOUNTER — Ambulatory Visit: Payer: Medicare HMO

## 2017-11-18 ENCOUNTER — Telehealth: Payer: Self-pay | Admitting: *Deleted

## 2017-11-18 DIAGNOSIS — H2513 Age-related nuclear cataract, bilateral: Secondary | ICD-10-CM | POA: Diagnosis not present

## 2017-11-18 NOTE — Telephone Encounter (Signed)
Patient called with concerns regarding her surgery in February.  She was advised to call her surgeons office.  She also had some questions regarding radiation and its necessity and wanted to speak with Dr. Lisbeth Renshaw.  I informed her that he was out of the office today.  I let her know that I would let Dr. Lisbeth Renshaw as well as Bryson Ha know of her concerns and one of them would give her a call.  I let her know that it may not be until tomorrow when she receives a call, she understood.  Will continue to follow as necessary.  Cori Razor, RN

## 2017-11-19 ENCOUNTER — Ambulatory Visit: Payer: Medicare HMO

## 2017-11-19 ENCOUNTER — Telehealth: Payer: Self-pay | Admitting: Radiation Oncology

## 2017-11-19 NOTE — Telephone Encounter (Signed)
LM for pt to return my call she had called yesterday asking if radiation was optional.

## 2017-11-20 ENCOUNTER — Ambulatory Visit: Payer: Medicare HMO

## 2017-11-21 ENCOUNTER — Ambulatory Visit: Payer: Medicare HMO

## 2017-11-22 ENCOUNTER — Ambulatory Visit: Payer: Medicare HMO

## 2017-11-25 ENCOUNTER — Ambulatory Visit: Payer: Medicare HMO

## 2017-11-27 ENCOUNTER — Ambulatory Visit: Payer: Medicare HMO

## 2017-12-27 NOTE — Progress Notes (Signed)
[facility]  [phone] [phone]) [phone]     ID: [name] DOB: 1951/06/26  MR#: [id number]  TDD#:[id number]  Patient Care Team: [name], MD as PCP - General (Family Medicine) Margia Wiesen, Virgie Dad, MD as Consulting Physician (Oncology) Coralie Keens, MD as Consulting Physician (General Surgery) Kyung Rudd, MD as Consulting Physician (Radiation Oncology) Alden Hipp, MD as Consulting Physician (Obstetrics and Gynecology) Clarene Essex, MD as Consulting Physician (Gastroenterology) Cristine Polio, MD as Consulting Physician (Plastic Surgery) OTHER MD:  CHIEF COMPLAINT: estrogen reeptor positive DCIS  CURRENT TREATMENT: Observation   HISTORY OF CURRENT ILLNESS: From the original intake note:  [name] had routine mammography at [facility] under Dr. Alden Hipp on 08/17/2017 that found a possible abnormality in the left breast. She underwent unilateral left diagnostic mammography with tomography at [place] on 08/26/2017 showing: breast density category B. Grouped calcification in the upper outer quadrant of the left breast at middle depth are suspicious for malignancy.  Accordingly on 08/27/2017 she proceeded to biopsy of the left breast area in question. The pathology from this procedure showed ([id number]): Ductal carcinoma in situ with calcifications. Prognostic indicators significant for: estrogen receptor, 95% positive and progesterone receptor, 90% positive, both with strong staining intensity.   The patient's subsequent history is as detailed below.  INTERVAL HISTORY: [name]  returns today for follow up and treatment of her estrogen receptor positive ductal carcinoma in situ. Since her last visit, she completed a left lumpectomy ([id number]) on 09/25/2017 with pathology showing: Ductal carcinoma in situ, intermediate grade, spanning 2.1 cm. Lobular neoplasia (Lobular carcinoma in situ). Negative margins  for DCIS.   She decided against radiation because her surgeon told her he "got it all" and because her cancer was noninvasive.  She also notes that she is conservative concerning medicine because of religious reasons.  She has a background in [employer].  She was also informed that radiation would make her fatigued, and she notes that she is already fatigued from her activities.  All is added up to her decision not to receive radiation.  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 She has full ROM. She may also feel some discomfort or pain in the left armpit, but it is gone now. She developed a cough in the spring, which felt more like a tickle in her throat. She took mucinex to aid this. She stays very active with voluntering and going to church groups. She sang in a church choir. She denies unusual headaches, visual changes, nausea, vomiting, or dizziness. There has been no unusual cough, phlegm production, or pleurisy. This been no change in bowel or bladder habits. She denies unexplained fatigue or unexplained weight loss, bleeding, rash, or fever. A detailed review of systems was otherwise stable.    PAST MEDICAL HISTORY: Past Medical History:  Diagnosis Date  . Abnormal liver function test   . Arthritis    knees, hands & hips, back   . Cancer ([place])    DCIS- L breast  . Complication of anesthesia   . Depression    post partum, also related to her in her 64's  . Dyslipidemia   . Esophageal spasm    rare occurence , /w ? reflux, no treatment or med. thus far  . Headache  tension , sporadic   . Hypertension   . Hypertriglyceridemia   . IBS (irritable bowel syndrome)   . PONV (postoperative nausea and vomiting)   . Sleep disturbance   Yeast allergy, HTN  PAST SURGICAL HISTORY: Past Surgical History:    Procedure Laterality Date  . BREAST LUMPECTOMY WITH RADIOACTIVE SEED LOCALIZATION Left 09/25/2017   Procedure: LEFT BREAST PARTIAL MASTECTOMY WITH RADIOACTIVE SEED LOCALIZATION ERAS PATHWAY;  Surgeon: Coralie Keens, MD;  Location: [facility];  Service: General;  Laterality: Left;  . BREAST REDUCTION SURGERY    . CARDIOVASCULAR STRESS TEST    . CESAREAN SECTION  1984  . KNEE ARTHROSCOPY Right 2004  . REDUCTION MAMMAPLASTY Bilateral 1997  . ROOT CANAL    . SHOULDER SURGERY Right    2012- arthroscopy  . TONSILLECTOMY    C-section 1984. Right shoulder and right knee surgery.  FAMILY HISTORY Family History  Problem Relation Age of Onset  . Cancer Mother   . Breast cancer Mother 76  . Cancer Father   The patient's mother died at age 84 due to breast cancer, diagnosed in her 64s. The patient's father died at age 45 due to colon cancer. The patient has 1 brother and no sisters. She denies a family history of other breast, ovarian, prostate, or colon cancers.   GYNECOLOGIC HISTORY:  No LMP recorded. Patient is postmenopausal. 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 [name], her husband, is retired from being a Dispensing optician in the Therapist, music. The patient's son, [name] "[address] [place], is a Geophysicist/field seismologist in [place], [place]. The patient has no grandchildren. She belongs to the [employer].    ADVANCED DIRECTIVES:    HEALTH MAINTENANCE: Social History   Tobacco Use  . Smoking status: Never Smoker  . Smokeless tobacco: Never Used  Substance Use Topics  . Alcohol use: No  . Drug use: No     Colonoscopy: within 2 years  PAP: 2017  Bone density: more than 2 years ago: osteopenia (2009) under Dr. Theadore Nan    Allergies   Allergen Reactions  . Yeast Other (See Comments)    Abdominal pain  . Fenofibrate Other (See Comments)    Unknown  . Glycerine [Glycerin] Nausea And Vomiting  . Keflex [Cephalexin] Nausea And Vomiting  . Tetracyclines & Related Nausea And Vomiting    Current Outpatient Medications  Medication Sig Dispense Refill  . acetaminophen (TYLENOL) 325 MG tablet Take 650 mg by mouth every 6 (six) hours as needed for moderate pain or headache.    Marland Kitchen aspirin-acetaminophen-caffeine (EXCEDRIN MIGRAINE) 250-250-65 MG tablet Take 2 tablets by mouth every 6 (six) hours as needed for headache.    . Camphor-Menthol-Capsicum (TIGER BALM PAIN RELIEVING EX) Apply 1 application topically daily as needed (for pain).    Raynald Blend EXTERNAL MEDICATION Take 2 tablets by mouth daily.    Marland Kitchen ibuprofen (ADVIL,MOTRIN) 200 MG tablet Take 400 mg by mouth every 6 (six) hours as needed for headache or moderate pain.    Marland Kitchen irbesartan (AVAPRO) 75 MG tablet Take 75 mg by mouth daily.    . Multiple Vitamin (MULTIVITAMIN WITH MINERALS) TABS tablet Take 1 tablet by mouth daily.     . naproxen sodium (ALEVE)  220 MG tablet Take 440 mg by mouth daily as needed (for pain).    Marland Kitchen OVER THE COUNTER MEDICATION Take 2 tablets by mouth daily. Fortify Supplement    . polyethylene glycol (MIRALAX / GLYCOLAX) packet Take 17 g by mouth daily as needed for moderate constipation.     . Probiotic Product (PROBIOTIC DAILY PO) Take 1 tablet by mouth as needed (for health).     . Simethicone (PHAZYME) 180 MG CAPS Take 180-360 mg by mouth daily as needed (for gas).    . traMADol (ULTRAM) 50 MG tablet Take 1-2 tablets (50-100 mg total) by mouth every 6 (six) hours as needed. (Patient not taking: Reported on 10/10/2017) 15 tablet 0  . zaleplon (SONATA) 10 MG capsule Take 10 mg by mouth at bedtime as needed for sleep.     No current facility-administered medications for this visit.     OBJECTIVE: Middle-aged white woman appears well  Vitals:   12/30/17  1509  BP: (!) 159/89  Pulse: 72  Resp: 18  Temp: 98.5 F (36.9 C)  SpO2: 99%     Body mass index is 25.64 kg/m.   Wt Readings from Last 3 Encounters:  12/30/17 135 lb 11.2 oz (61.6 kg)  10/10/17 136 lb 6.4 oz (61.9 kg)  09/24/17 137 lb 6.4 oz (62.3 kg)      ECOG FS:0 - Asymptomatic  Sclerae unicteric, EOMs intact Oropharynx clear and moist No cervical or supraclavicular adenopathy Lungs no rales or rhonchi Heart regular rate and rhythm Abd soft, nontender, positive bowel sounds MSK no focal spinal tenderness, no upper extremity lymphedema Neuro: nonfocal, well oriented, appropriate affect Breasts: The right breast is unremarkable.  The left breast is status post lumpectomy.  There is no evidence of residual or recurrent disease.  The cosmetic result is excellent.  Both axillae are benign.    LAB RESULTS:  CMP     Component Value Date/Time   NA 139 09/23/2017 1539   K 3.7 09/23/2017 1539   CL 105 09/23/2017 1539   CO2 25 09/23/2017 1539   GLUCOSE 92 09/23/2017 1539   BUN 14 09/23/2017 1539   CREATININE 0.86 09/23/2017 1539   CALCIUM 9.2 09/23/2017 1539   PROT 7.8 09/23/2017 1539   ALBUMIN 4.3 09/23/2017 1539   AST 25 09/23/2017 1539   ALT 36 09/23/2017 1539   ALKPHOS 101 09/23/2017 1539   BILITOT 0.5 09/23/2017 1539   GFRNONAA >60 09/23/2017 1539   GFRAA >60 09/23/2017 1539    No results found for: TOTALPROTELP, ALBUMINELP, A1GS, A2GS, BETS, BETA2SER, GAMS, MSPIKE, SPEI  No results found for: KPAFRELGTCHN, LAMBDASER, KAPLAMBRATIO  Lab Results  Component Value Date   WBC 7.1 09/23/2017   NEUTROABS 3.1 09/23/2017   HGB 15.0 09/23/2017   HCT 42.6 09/23/2017   MCV 90.9 09/23/2017   PLT 256 09/23/2017    _0 @  No results found for: LABCA2  No components found for: [id number]  No results for input(s): INR in the last 168 hours.  No results found for: LABCA2  No results found for: ZJI967  No results found for: ELF810  No results found  for: FBP102  No results found for: CA2729  No components found for: HGQUANT  No results found for: CEA1 / No results found for: CEA1   No results found for: AFPTUMOR  No results found for: CHROMOGRNA  No results found for: PSA1  No visits with results within 3 Day(s) from this visit.  Latest known visit with results  is:  Appointment on 09/23/2017  Component Date Value Ref Range Status  . Sodium 09/23/2017 139  136 - 145 mmol/L Final  . Potassium 09/23/2017 3.7  3.5 - 5.1 mmol/L Final  . Chloride 09/23/2017 105  98 - 109 mmol/L Final  . CO2 09/23/2017 25  22 - 29 mmol/L Final  . Glucose, Bld 09/23/2017 92  70 - 140 mg/dL Final  . BUN 09/23/2017 14  7 - 26 mg/dL Final  . Creatinine 09/23/2017 0.86  0.60 - 1.10 mg/dL Final  . Calcium 09/23/2017 9.2  8.4 - 10.4 mg/dL Final  . Total Protein 09/23/2017 7.8  6.4 - 8.3 g/dL Final  . Albumin 09/23/2017 4.3  3.5 - 5.0 g/dL Final  . AST 09/23/2017 25  5 - 34 U/L Final  . ALT 09/23/2017 36  0 - 55 U/L Final  . Alkaline Phosphatase 09/23/2017 101  40 - 150 U/L Final  . Total Bilirubin 09/23/2017 0.5  0.2 - 1.2 mg/dL Final  . GFR, Est Non Af Am 09/23/2017 >60  >60 mL/min Final  . GFR, Est AFR Am 09/23/2017 >60  >60 mL/min Final   Comment: (NOTE) The eGFR has been calculated using the CKD EPI equation. This calculation has not been validated in all clinical situations. eGFR's persistently <60 mL/min signify possible Chronic Kidney Disease.   Georgiann Hahn gap 09/23/2017 9  3 - 11 Final   Performed at [facility], [place] [address], [place], [place] [address]  . WBC Count 09/23/2017 7.1  3.9 - 10.3 K/uL Final  . RBC 09/23/2017 4.69  3.70 - 5.45 MIL/uL Final  . Hemoglobin 09/23/2017 15.0  11.6 - 15.9 g/dL Final  . HCT 09/23/2017 42.6  34.8 - 46.6 % Final  . MCV 09/23/2017 90.9  79.5 - 101.0 fL Final  . MCH 09/23/2017 32.0  25.1 - 34.0 pg Final  . MCHC 09/23/2017 35.2  31.5 - 36.0 g/dL Final  . RDW 09/23/2017 12.7   11.2 - 14.5 % Final  . Platelet Count 09/23/2017 256  145 - 400 K/uL Final  . Neutrophils Relative % 09/23/2017 44  % Final  . Neutro Abs 09/23/2017 3.1  1.5 - 6.5 K/uL Final  . Lymphocytes Relative 09/23/2017 47  % Final  . Lymphs Abs 09/23/2017 3.3  0.9 - 3.3 K/uL Final  . Monocytes Relative 09/23/2017 6  % Final  . Monocytes Absolute 09/23/2017 0.4  0.1 - 0.9 K/uL Final  . Eosinophils Relative 09/23/2017 2  % Final  . Eosinophils Absolute 09/23/2017 0.1  0.0 - 0.5 K/uL Final  . Basophils Relative 09/23/2017 1  % Final  . Basophils Absolute 09/23/2017 0.1  0.0 - 0.1 K/uL Final   Performed at [facility], [place] [address], [place], [place] [address]    (this displays the last labs from the last 3 days)  No results found for: TOTALPROTELP, ALBUMINELP, A1GS, A2GS, BETS, BETA2SER, GAMS, MSPIKE, SPEI (this displays SPEP labs)  No results found for: KPAFRELGTCHN, LAMBDASER, KAPLAMBRATIO (kappa/lambda light chains)  No results found for: HGBA, HGBA2QUANT, HGBFQUANT, HGBSQUAN (Hemoglobinopathy evaluation)   No results found for: LDH  No results found for: IRON, TIBC, IRONPCTSAT (Iron and TIBC)  No results found for: FERRITIN  Urinalysis No results found for: COLORURINE, APPEARANCEUR, LABSPEC, PHURINE, GLUCOSEU, HGBUR, BILIRUBINUR, KETONESUR, PROTEINUR, UROBILINOGEN, NITRITE, LEUKOCYTESUR   STUDIES: No results found.  ELIGIBLE FOR AVAILABLE RESEARCH PROTOCOL: Decided against the COMET trial  ASSESSMENT: 67 y.o. [place], [place] Woman  (  1) s/p left breast upper outer quadrant biopsy 08/27/2017 for ductal carcinoma in situ, grade 2, estrogen and progesterone receptor positive  (2) left lumpectomy with no sentinel lymph node sampling 09/25/2017 confirmed ductal carcinoma in situ, grade 2, measuring 2.1 cm, with negative margins.  (3) patient decided against adjuvant radiation.  (4) patient decided against adjuvant antiestrogens  PLAN: I spent close to  40 minutes with Mateo Flow today going over her situation.  I gave her a copy of her pathology report and discussed it with her so she understands she had noninvasive, stage 0 breast cancer, with negative margins.  We reviewed the fact that because this was noninvasive it is not expected to be life-threatening.  She thought she was cured with surgery but of course there is a risk of recurrence locally which I quoted in the 20% range.  It could be as low as 15 RSI is 25%.  If she took radiation, which she decided against, that would cut the risk by more than half.  However for reasons discussed above she decided against radiation.  Today we discussed antiestrogens.  We will also cut the risk in approximately half, and they would cut the risk of a new breast cancer developing in either breast as well. We discussed the difference between tamoxifen and anastrozole in detail. She understands that anastrozole and the aromatase inhibitors in general work by blocking estrogen production. Accordingly vaginal dryness, decrease in bone density, and of course hot flashes can result. The aromatase inhibitors can also negatively affect the cholesterol profile, although that is a minor effect. One out of 5 women on aromatase inhibitors we will feel "old and achy". This arthralgia/myalgia syndrome, which resembles fibromyalgia clinically, does resolve with stopping the medications. Accordingly this is not a reason to not try an aromatase inhibitor but it is a frequent reason to stop it (in other words 20% of women will not be able to tolerate these medications).  Tamoxifen on the other hand does not block estrogen production. It does not "take away a woman's estrogen". It blocks the estrogen receptor in breast cells. Like anastrozole, it can also cause hot flashes. As opposed to anastrozole, tamoxifen has many estrogen-like effects. It is technically an estrogen receptor modulator. This means that in some tissues tamoxifen works  like estrogen-- for example it helps strengthen the bones. It tends to improve the cholesterol profile. It can cause thickening of the endometrial lining, and even endometrial polyps or rarely cancer of the uterus.(The risk of uterine cancer due to tamoxifen is one additional cancer per thousand women year). It can cause vaginal wetness or stickiness. It can cause blood clots through this estrogen-like effect--the risk of blood clots with tamoxifen is exactly the same as with birth control pills or hormone replacement.  Neither of these agents causes mood changes or weight gain, despite the popular belief that they can have these side effects. We have data from studies comparing either of these drugs with placebo, and in those cases the control group had the same amount of weight gain and depression as the group that took the drug.  After all the discussion she decided she really is more worried about side effects and about the 20% risk of recurrence, even though she understands half of those recurrence may be invasive.  Accordingly the plan is for observation alone.  She will have her next mammogram in January.  She will see me shortly after that and I will see her yearly thereafter until she  completes her 5 years of follow-up  She knows to call for any other issues that may develop before the next visit.  Keonna Raether, Virgie Dad, MD  12/30/17 3:36 PM Medical Oncology and Hematology [facility] [address] [place], [place] [address] Tel. [phone]    Fax. [phone]  This document serves as a record of services personally performed by Lurline Del, MD. It was created on his behalf by Sheron Nightingale, a trained medical scribe. The creation of this record is based on the scribe's personal observations and the provider's statements to them.   I have reviewed the above documentation for accuracy and completeness, and I agree with the above.

## 2017-12-30 ENCOUNTER — Inpatient Hospital Stay: Payer: Medicare HMO | Attending: Oncology | Admitting: Oncology

## 2017-12-30 ENCOUNTER — Telehealth: Payer: Self-pay | Admitting: Oncology

## 2017-12-30 VITALS — BP 159/89 | HR 72 | Temp 98.5°F | Resp 18 | Ht 61.0 in | Wt 135.7 lb

## 2017-12-30 DIAGNOSIS — E781 Pure hyperglyceridemia: Secondary | ICD-10-CM | POA: Diagnosis not present

## 2017-12-30 DIAGNOSIS — Z170 Estrogen receptor positive status [ER+]: Secondary | ICD-10-CM

## 2017-12-30 DIAGNOSIS — E785 Hyperlipidemia, unspecified: Secondary | ICD-10-CM | POA: Insufficient documentation

## 2017-12-30 DIAGNOSIS — D0512 Intraductal carcinoma in situ of left breast: Secondary | ICD-10-CM | POA: Diagnosis not present

## 2017-12-30 DIAGNOSIS — M797 Fibromyalgia: Secondary | ICD-10-CM | POA: Insufficient documentation

## 2017-12-30 DIAGNOSIS — F329 Major depressive disorder, single episode, unspecified: Secondary | ICD-10-CM | POA: Insufficient documentation

## 2017-12-30 DIAGNOSIS — C50412 Malignant neoplasm of upper-outer quadrant of left female breast: Secondary | ICD-10-CM

## 2017-12-30 DIAGNOSIS — Z800 Family history of malignant neoplasm of digestive organs: Secondary | ICD-10-CM | POA: Diagnosis not present

## 2017-12-30 DIAGNOSIS — K589 Irritable bowel syndrome without diarrhea: Secondary | ICD-10-CM | POA: Diagnosis not present

## 2017-12-30 DIAGNOSIS — I10 Essential (primary) hypertension: Secondary | ICD-10-CM

## 2017-12-30 DIAGNOSIS — M129 Arthropathy, unspecified: Secondary | ICD-10-CM | POA: Insufficient documentation

## 2017-12-30 DIAGNOSIS — R69 Illness, unspecified: Secondary | ICD-10-CM | POA: Diagnosis not present

## 2017-12-30 DIAGNOSIS — Z79899 Other long term (current) drug therapy: Secondary | ICD-10-CM

## 2017-12-30 NOTE — Telephone Encounter (Signed)
Gave patient AVs and calendar of upcoming February appointments.  °

## 2017-12-31 ENCOUNTER — Ambulatory Visit: Payer: Medicare HMO | Admitting: Oncology

## 2018-01-23 DIAGNOSIS — M9903 Segmental and somatic dysfunction of lumbar region: Secondary | ICD-10-CM | POA: Diagnosis not present

## 2018-01-23 DIAGNOSIS — M9904 Segmental and somatic dysfunction of sacral region: Secondary | ICD-10-CM | POA: Diagnosis not present

## 2018-01-23 DIAGNOSIS — M9905 Segmental and somatic dysfunction of pelvic region: Secondary | ICD-10-CM | POA: Diagnosis not present

## 2018-01-23 DIAGNOSIS — M5136 Other intervertebral disc degeneration, lumbar region: Secondary | ICD-10-CM | POA: Diagnosis not present

## 2018-01-24 DIAGNOSIS — M85852 Other specified disorders of bone density and structure, left thigh: Secondary | ICD-10-CM | POA: Diagnosis not present

## 2018-01-24 DIAGNOSIS — E781 Pure hyperglyceridemia: Secondary | ICD-10-CM | POA: Diagnosis not present

## 2018-01-24 DIAGNOSIS — G479 Sleep disorder, unspecified: Secondary | ICD-10-CM | POA: Diagnosis not present

## 2018-01-24 DIAGNOSIS — I10 Essential (primary) hypertension: Secondary | ICD-10-CM | POA: Diagnosis not present

## 2018-01-24 DIAGNOSIS — Z1389 Encounter for screening for other disorder: Secondary | ICD-10-CM | POA: Diagnosis not present

## 2018-01-24 DIAGNOSIS — R05 Cough: Secondary | ICD-10-CM | POA: Diagnosis not present

## 2018-01-24 DIAGNOSIS — Z0000 Encounter for general adult medical examination without abnormal findings: Secondary | ICD-10-CM | POA: Diagnosis not present

## 2018-03-10 DIAGNOSIS — M8588 Other specified disorders of bone density and structure, other site: Secondary | ICD-10-CM | POA: Diagnosis not present

## 2018-03-31 DIAGNOSIS — M85851 Other specified disorders of bone density and structure, right thigh: Secondary | ICD-10-CM | POA: Diagnosis not present

## 2018-04-01 DIAGNOSIS — R69 Illness, unspecified: Secondary | ICD-10-CM | POA: Diagnosis not present

## 2018-04-03 DIAGNOSIS — M1711 Unilateral primary osteoarthritis, right knee: Secondary | ICD-10-CM | POA: Diagnosis not present

## 2018-04-07 ENCOUNTER — Encounter: Payer: Medicare HMO | Admitting: Adult Health

## 2018-04-08 DIAGNOSIS — M25511 Pain in right shoulder: Secondary | ICD-10-CM | POA: Diagnosis not present

## 2018-04-24 DIAGNOSIS — D2239 Melanocytic nevi of other parts of face: Secondary | ICD-10-CM | POA: Diagnosis not present

## 2018-04-24 DIAGNOSIS — L853 Xerosis cutis: Secondary | ICD-10-CM | POA: Diagnosis not present

## 2018-04-24 DIAGNOSIS — L309 Dermatitis, unspecified: Secondary | ICD-10-CM | POA: Diagnosis not present

## 2018-04-24 DIAGNOSIS — L720 Epidermal cyst: Secondary | ICD-10-CM | POA: Diagnosis not present

## 2018-04-24 DIAGNOSIS — L821 Other seborrheic keratosis: Secondary | ICD-10-CM | POA: Diagnosis not present

## 2018-04-24 DIAGNOSIS — D2261 Melanocytic nevi of right upper limb, including shoulder: Secondary | ICD-10-CM | POA: Diagnosis not present

## 2018-04-24 DIAGNOSIS — D1801 Hemangioma of skin and subcutaneous tissue: Secondary | ICD-10-CM | POA: Diagnosis not present

## 2018-04-24 DIAGNOSIS — D225 Melanocytic nevi of trunk: Secondary | ICD-10-CM | POA: Diagnosis not present

## 2018-04-24 DIAGNOSIS — L570 Actinic keratosis: Secondary | ICD-10-CM | POA: Diagnosis not present

## 2018-06-05 DIAGNOSIS — R69 Illness, unspecified: Secondary | ICD-10-CM | POA: Diagnosis not present

## 2018-07-04 DIAGNOSIS — E559 Vitamin D deficiency, unspecified: Secondary | ICD-10-CM | POA: Diagnosis not present

## 2018-07-14 IMAGING — MG NEEDLE LOCALIZATION OF THE LEFT BREAST WITH MAMMO GUIDANCE
5 series · 5 of 5 positions shown · non-contrast
Comparison: Previous exam(s).

CLINICAL DATA: 66-year-old patient with recent diagnosis of ductal
carcinoma in situ following stereotactic biopsy. She declined
placement of a biopsy marker clip at the time of biopsy.

EXAM:
MAMMOGRAPHIC GUIDED RADIOACTIVE SEED LOCALIZATION OF THE LEFT BREAST

[L LM (1 of 3)]
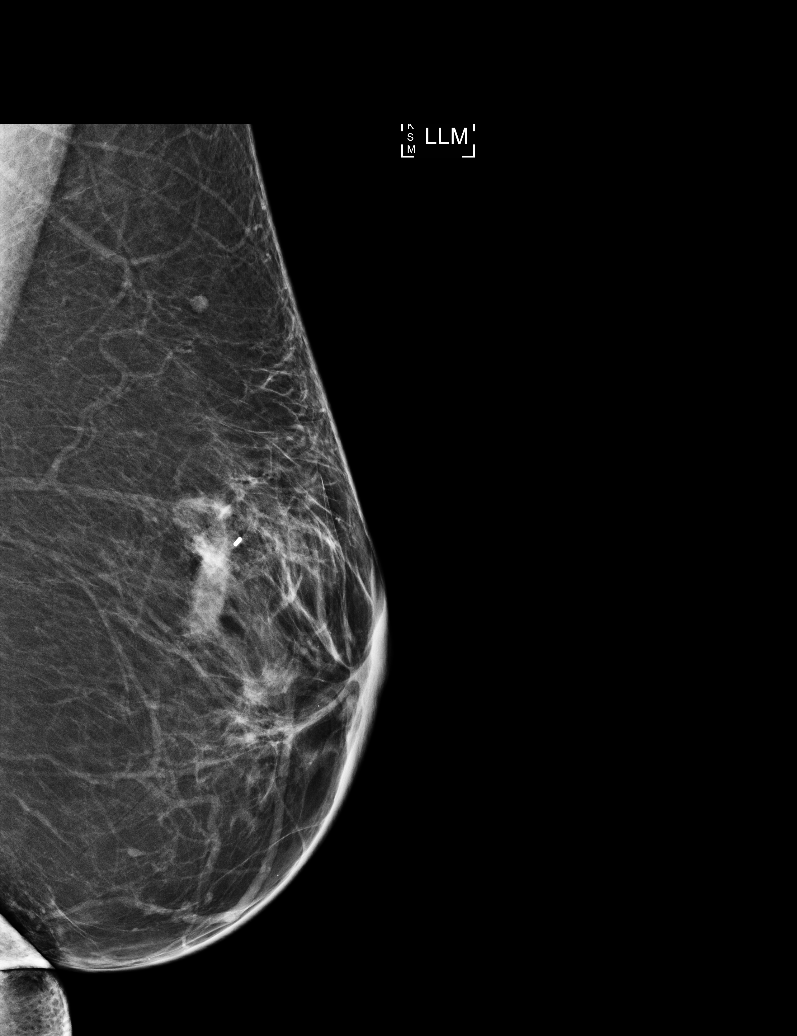

[L CC (1 of 2)]
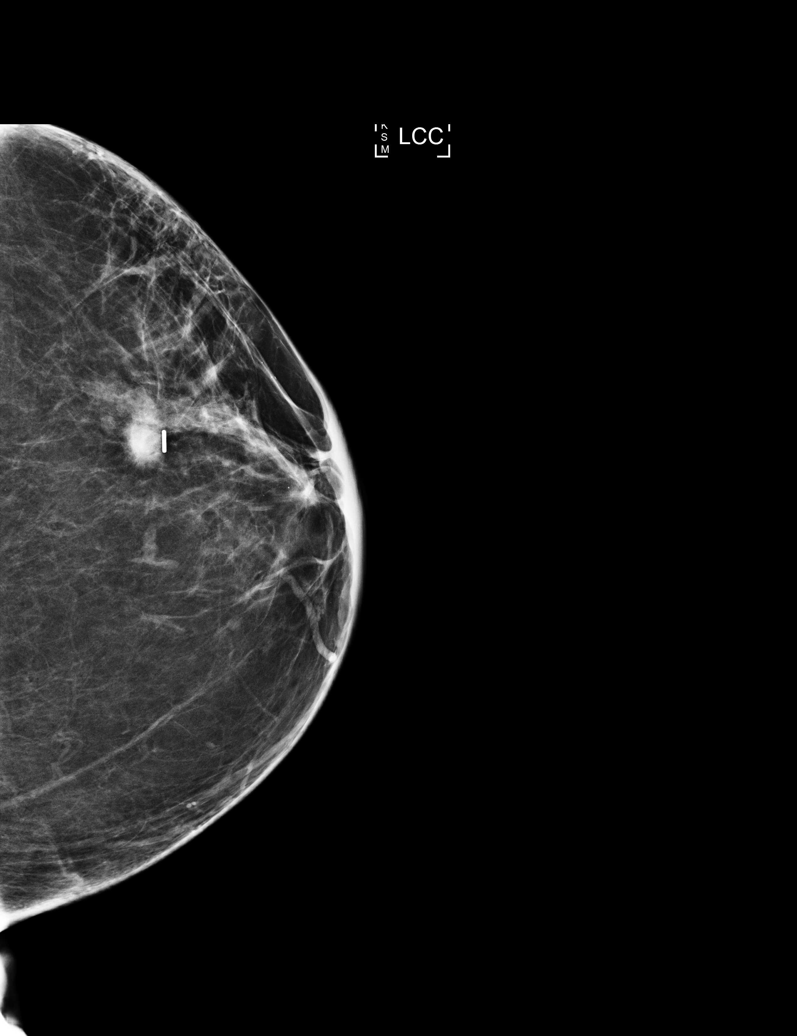

[L LM (2 of 3)]
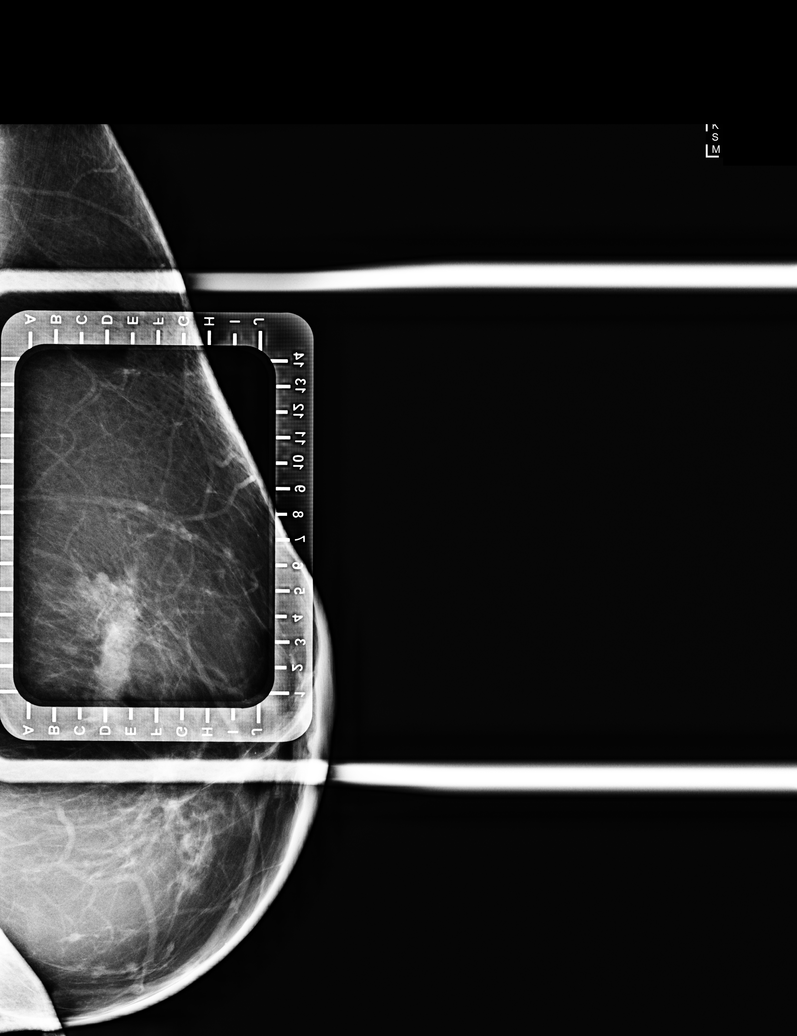

[L CC (2 of 2)]
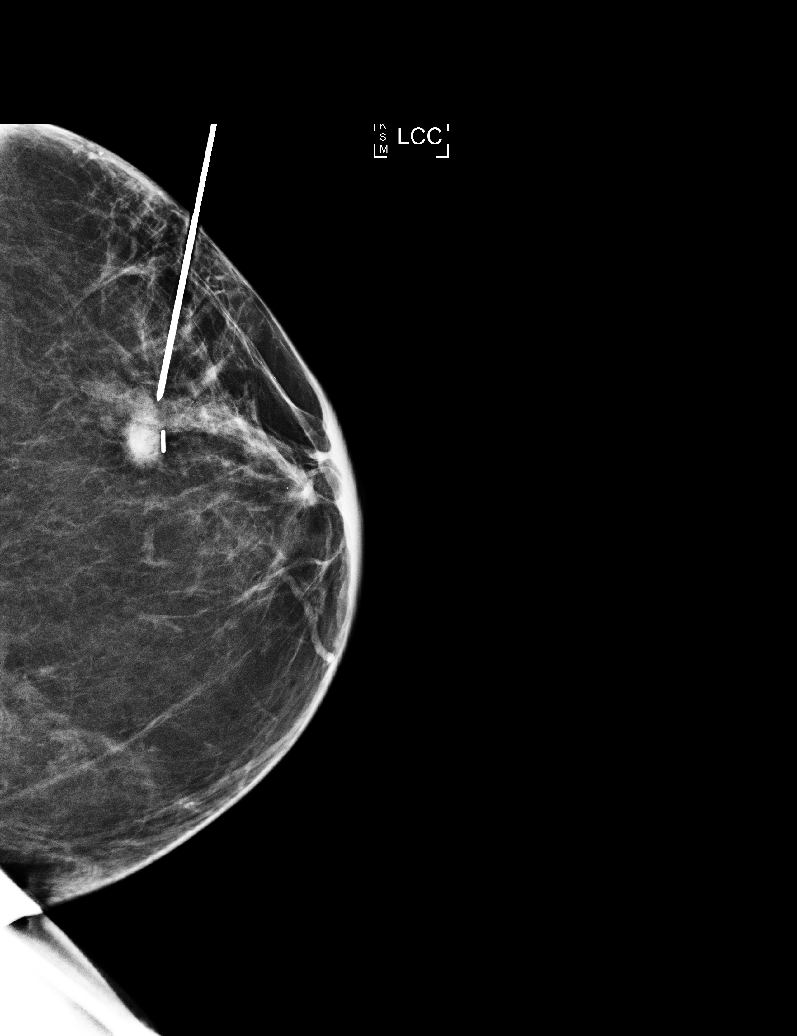

[L LM (3 of 3)]
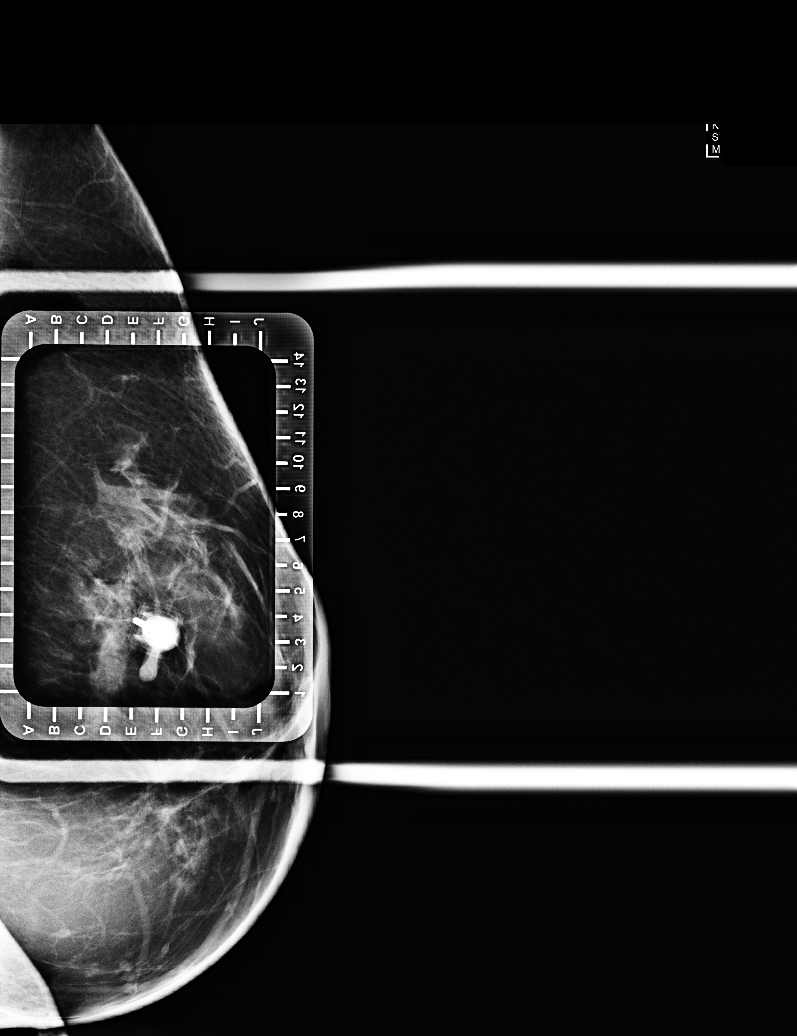

[5 of 5 positions shown; findings below may reference images not displayed]

FINDINGS: Patient presents for radioactive seed localization prior to
lumpectomy. I met with the patient and we discussed the procedure of
seed localization including benefits and alternatives. We discussed
the high likelihood of a successful procedure. We discussed the
risks of the procedure including infection, bleeding, tissue injury
and further surgery. We discussed the low dose of radioactivity
involved in the procedure. Informed, written consent was given.

The usual time-out protocol was performed immediately prior to the
procedure.

Using mammographic guidance, sterile technique, 1% lidocaine and an
D-2BD radioactive seed, residual calcifications, and a focal island
of tissue within which multiple calcifications were present prior to
biopsy was localized using a lateral approach. The follow-up
mammogram images confirm the seed in the expected location and were
marked for Dr. Cate.

Follow-up survey of the patient confirms presence of the radioactive
seed.

Order number of D-2BD seed:  [id number].

Total activity:  0.248 millicuries reference Date: 10 September, 2017

The patient tolerated the procedure well and was released from the
[REDACTED]. She was given instructions regarding seed removal.
IMPRESSION: Radioactive seed localization left breast. No apparent
complications.

## 2018-07-19 IMAGING — MG MM BREAST SURGICAL SPECIMEN
1 series · 1 of 1 positions shown · non-contrast
Comparison: Previous exam(s).

CLINICAL DATA: Evaluate specimen

EXAM:
SPECIMEN RADIOGRAPH OF THE LEFT BREAST

[L]
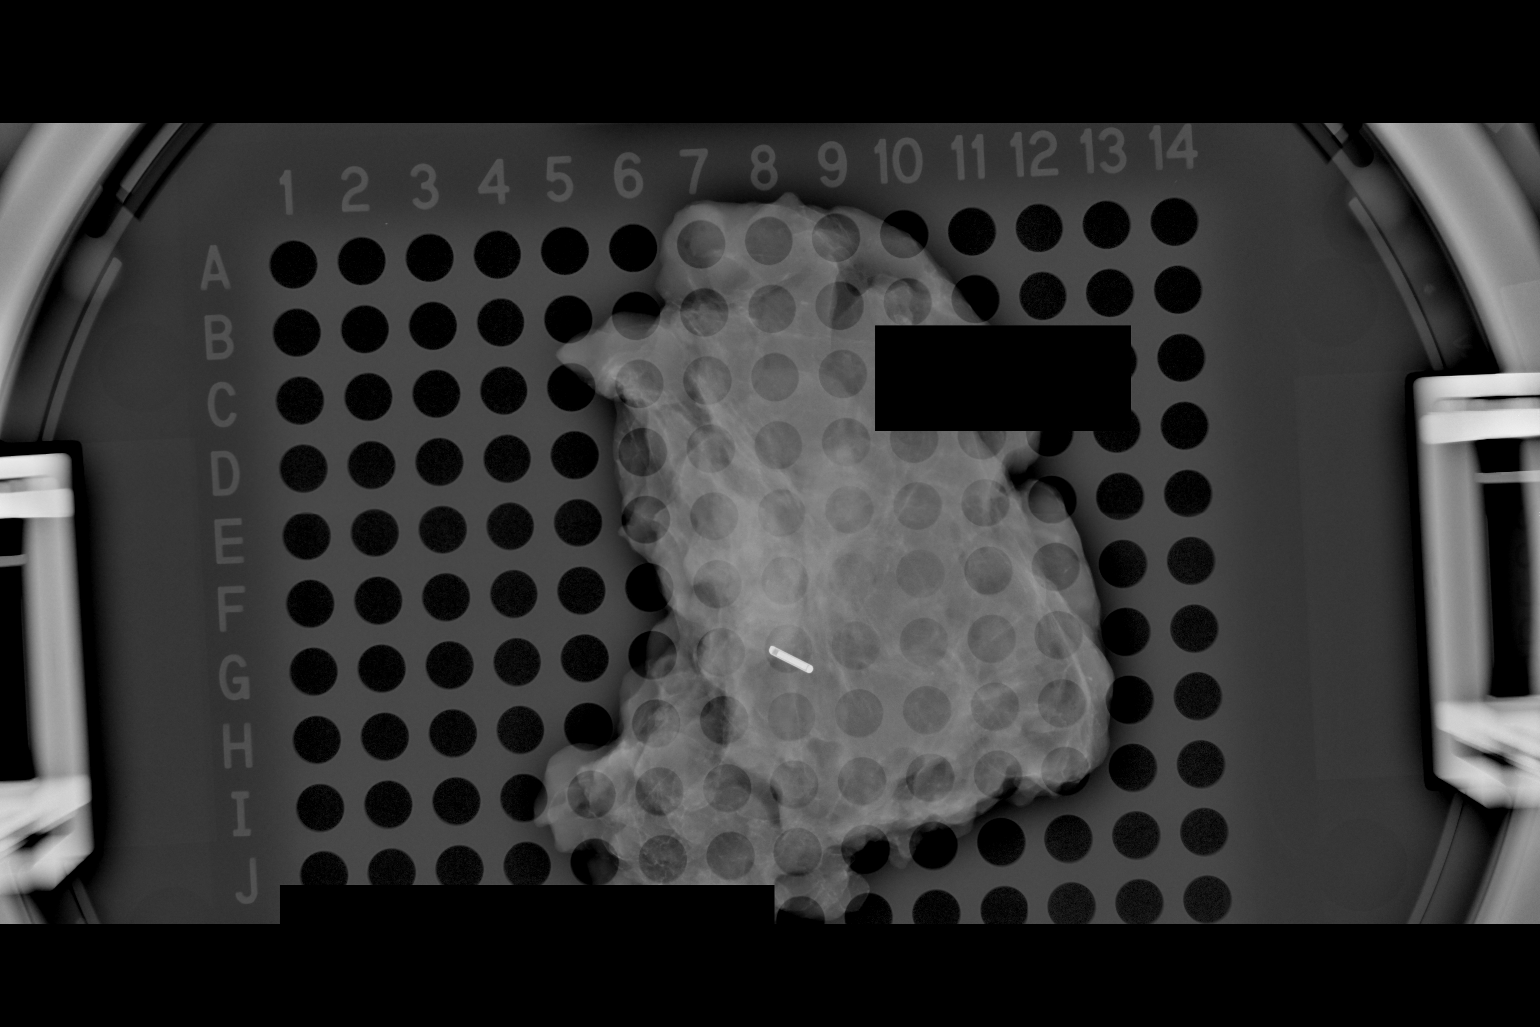

[1 of 1 positions shown; findings below may reference images not displayed]

FINDINGS: Status post excision of the left breast. The radioactive seed is
present, completely intact, and were marked for pathology. The
radioactive seed is within the specimen. A biopsy clip was not left
at the time of stereotactic biopsy per patient request.
IMPRESSION: Specimen radiograph of the left breast.

## 2018-07-28 DIAGNOSIS — M170 Bilateral primary osteoarthritis of knee: Secondary | ICD-10-CM | POA: Diagnosis not present

## 2018-07-28 DIAGNOSIS — D0512 Intraductal carcinoma in situ of left breast: Secondary | ICD-10-CM | POA: Diagnosis not present

## 2018-07-28 DIAGNOSIS — G479 Sleep disorder, unspecified: Secondary | ICD-10-CM | POA: Diagnosis not present

## 2018-07-28 DIAGNOSIS — I10 Essential (primary) hypertension: Secondary | ICD-10-CM | POA: Diagnosis not present

## 2018-07-28 DIAGNOSIS — E781 Pure hyperglyceridemia: Secondary | ICD-10-CM | POA: Diagnosis not present

## 2018-07-28 DIAGNOSIS — E559 Vitamin D deficiency, unspecified: Secondary | ICD-10-CM | POA: Diagnosis not present

## 2018-07-28 DIAGNOSIS — R05 Cough: Secondary | ICD-10-CM | POA: Diagnosis not present

## 2018-08-15 ENCOUNTER — Ambulatory Visit: Payer: Medicare HMO | Admitting: Sports Medicine

## 2018-09-02 ENCOUNTER — Ambulatory Visit
Admission: RE | Admit: 2018-09-02 | Discharge: 2018-09-02 | Disposition: A | Discharge status: Home / Self Care | Payer: PPO | Source: Ambulatory Visit | Attending: Oncology | Admitting: Oncology

## 2018-09-02 ENCOUNTER — Other Ambulatory Visit: Payer: Self-pay | Admitting: Oncology

## 2018-09-02 DIAGNOSIS — Z853 Personal history of malignant neoplasm of breast: Secondary | ICD-10-CM | POA: Diagnosis not present

## 2018-09-02 DIAGNOSIS — D0512 Intraductal carcinoma in situ of left breast: Secondary | ICD-10-CM

## 2018-09-02 DIAGNOSIS — Z170 Estrogen receptor positive status [ER+]: Secondary | ICD-10-CM

## 2018-09-02 DIAGNOSIS — C50412 Malignant neoplasm of upper-outer quadrant of left female breast: Secondary | ICD-10-CM

## 2018-09-02 DIAGNOSIS — N6310 Unspecified lump in the right breast, unspecified quadrant: Secondary | ICD-10-CM

## 2018-09-02 DIAGNOSIS — R928 Other abnormal and inconclusive findings on diagnostic imaging of breast: Secondary | ICD-10-CM | POA: Diagnosis not present

## 2018-09-02 DIAGNOSIS — N6489 Other specified disorders of breast: Secondary | ICD-10-CM | POA: Diagnosis not present

## 2018-09-05 ENCOUNTER — Ambulatory Visit
Admission: RE | Admit: 2018-09-05 | Discharge: 2018-09-05 | Disposition: A | Discharge status: Home / Self Care | Payer: PPO | Source: Ambulatory Visit | Attending: Oncology | Admitting: Oncology

## 2018-09-05 ENCOUNTER — Other Ambulatory Visit: Payer: Self-pay | Admitting: Oncology

## 2018-09-05 DIAGNOSIS — N6311 Unspecified lump in the right breast, upper outer quadrant: Secondary | ICD-10-CM | POA: Diagnosis not present

## 2018-09-05 DIAGNOSIS — N6315 Unspecified lump in the right breast, overlapping quadrants: Secondary | ICD-10-CM | POA: Diagnosis not present

## 2018-09-05 DIAGNOSIS — D241 Benign neoplasm of right breast: Secondary | ICD-10-CM | POA: Diagnosis not present

## 2018-09-05 DIAGNOSIS — N6310 Unspecified lump in the right breast, unspecified quadrant: Secondary | ICD-10-CM

## 2018-09-05 HISTORY — PX: BREAST BIOPSY: SHX20

## 2018-09-09 ENCOUNTER — Other Ambulatory Visit: Payer: Self-pay | Admitting: Surgery

## 2018-09-09 ENCOUNTER — Other Ambulatory Visit: Payer: Self-pay | Admitting: Oncology

## 2018-09-09 DIAGNOSIS — D241 Benign neoplasm of right breast: Secondary | ICD-10-CM

## 2018-09-11 NOTE — Progress Notes (Signed)
[place]  [phone] [phone]) [phone]    ID: [name] DOB: [birth year]  MR#: [id number]  JOA#:[id number]  Patient Care Team: [name], MD as PCP - General (Family Medicine) Nathanuel Cabreja, Virgie Dad, MD as Consulting Physician (Oncology) Coralie Keens, MD as Consulting Physician (General Surgery) Kyung Rudd, MD as Consulting Physician (Radiation Oncology) Alden Hipp, MD as Consulting Physician (Obstetrics and Gynecology) Clarene Essex, MD as Consulting Physician (Gastroenterology) Cristine Polio, MD as Consulting Physician (Plastic Surgery) OTHER MD:   CHIEF COMPLAINT: estrogen reeptor positive DCIS  CURRENT TREATMENT: Observation   HISTORY OF CURRENT ILLNESS: From the original intake note:  [name] had routine mammography at [facility] under Dr. Alden Hipp on 08/17/2017 that found a possible abnormality in the left breast. She underwent unilateral left diagnostic mammography with tomography at [place] on 08/26/2017 showing: breast density category B. Grouped calcification in the upper outer quadrant of the left breast at middle depth are suspicious for malignancy.  Accordingly on 08/27/2017 she proceeded to biopsy of the left breast area in question. The pathology from this procedure showed ([id number]): Ductal carcinoma in situ with calcifications. Prognostic indicators significant for: estrogen receptor, 95% positive and progesterone receptor, 90% positive, both with strong staining intensity.   The patient's subsequent history is as detailed below.   INTERVAL HISTORY: "[name]" returns today for follow-up and treatment of a new found right breast papilloma..  Since her last visit here, she underwent a digital diagnostic mammogram with tomography and a right breast ultrasoung on 09/02/2018 showing There is central fat density with peripheral punctate calcifications in the lumpectomy bed  consistent with fat necrosis. There are no left breast masses or areas of nonsurgical architectural distortion. There are no suspicious calcifications. On the right, there are several calcifications associated with a tubular like area of density, lying lateral to the right nipple. There is no architectural distortion. On physical exam, no mass is palpated in the lateral right breast. Targeted ultrasound is performed, showing an elongated hypoechoic lesion consistent with a dilated duct containing debris or possible tissue. It lies at 3 o'clock, 3 cm the nipple, measuring 2.3 x 0.4 x 0.8 cm. There is a smaller similar appearing area that lies adjacent to this. Both areas show small echogenic foci consistent with calcifications. On color Doppler analysis, blood flow is noted along the periphery of the larger lesion. Sonographic evaluation of the right axilla shows no enlarged or abnormal lymph nodes.  She then proceeded with a biopsy of the right breast area in question. The pathology from this procedure showed ([id number]):   Breast, right, needle core biopsy, 9 o'clock, 3cmfn  - intraductal papilloma with sclerosis and usual ductal hyperplasia. See note.  - rare dystrophic calcifications.  - negative for carcinoma.   REVIEW OF SYSTEMS: [name] had no new symptoms leading to her mammography. She has been singing recently and she is a part of 5 different choirs. She does yoga usually twice per week. She has occasional pain in her nipple and areola area, which she treats with a topical capsaicin ointment with relief. The patient denies unusual headaches, visual changes, nausea, vomiting, or dizziness. There has been no unusual cough, phlegm production, or pleurisy. This been no change in bowel or bladder habits. The patient denies unexplained fatigue or unexplained weight loss, bleeding, rash, or fever. A detailed review of systems was otherwise noncontributory.    PAST MEDICAL HISTORY: Past Medical  History:  Diagnosis Date  . Abnormal liver  function test   . Arthritis    knees, hands & hips, back   . Cancer ([place])    DCIS- L breast  . Complication of anesthesia   . Depression    post partum, also related to her in her 9's  . Dyslipidemia   . Esophageal spasm    rare occurence , /w ? reflux, no treatment or med. thus far  . Headache    tension , sporadic   . Hypertension   . Hypertriglyceridemia   . IBS (irritable bowel syndrome)   . PONV (postoperative nausea and vomiting)   . Sleep disturbance   Yeast allergy, HTN  PAST SURGICAL HISTORY: Past Surgical History:  Procedure Laterality Date  . BREAST LUMPECTOMY Left 09/2017  . BREAST LUMPECTOMY WITH RADIOACTIVE SEED LOCALIZATION Left 09/25/2017   Procedure: LEFT BREAST PARTIAL MASTECTOMY WITH RADIOACTIVE SEED LOCALIZATION ERAS PATHWAY;  Surgeon: Coralie Keens, MD;  Location: [place];  Service: General;  Laterality: Left;  . BREAST REDUCTION SURGERY    . CARDIOVASCULAR STRESS TEST    . CESAREAN SECTION  1984  . KNEE ARTHROSCOPY Right 2004  . REDUCTION MAMMAPLASTY Bilateral 1997  . ROOT CANAL    . SHOULDER SURGERY Right    2012- arthroscopy  . TONSILLECTOMY    C-section 1984. Right shoulder and right knee surgery.  FAMILY HISTORY Family History  Problem Relation Age of Onset  . Cancer Mother   . Breast cancer Mother 69  . Cancer Father   The patient's mother died at age 72 due to breast cancer, diagnosed in her 35s. The patient's father died at age 90 due to colon cancer. The patient has 1 brother and no sisters. She denies a family history of other breast, ovarian, prostate, or colon cancers.   GYNECOLOGIC HISTORY:  No LMP recorded. Patient is postmenopausal. 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 [name], her husband, is retired from being a Dispensing optician in the Therapist, music. The patient's son, [name] "[address] Tra[address]ler, is a Geophysicist/field seismologist in [place], [place]. The patient has no grandchildren. She belongs to the [employer].    ADVANCED DIRECTIVES:    HEALTH MAINTENANCE: Social History   Tobacco Use  . Smoking status: Never Smoker  . Smokeless tobacco: Never Used  Substance Use Topics  . Alcohol use: No  . Drug use: No     Colonoscopy: within 2 years  PAP: 2017  Bone density: more than 2 years ago: osteopenia (2009) under Dr. Theadore Nan    Allergies  Allergen Reactions  . Yeast Other (See Comments)    Abdominal pain  . Fenofibrate Other (See Comments)    Unknown  . Glycerine [Glycerin] Nausea And Vomiting  . Keflex [Cephalexin] Nausea And Vomiting  . Tetracyclines & Related Nausea And Vomiting    Current Outpatient Medications  Medication Sig Dispense Refill  . acetaminophen (TYLENOL) 325 MG tablet Take 650 mg by mouth every 6 (six) hours as needed for moderate pain or headache.    Marland Kitchen aspirin-acetaminophen-caffeine (EXCEDRIN MIGRAINE) 250-250-65 MG tablet Take 2 tablets by mouth every 6 (six) hours as needed for headache.    . Camphor-Menthol-Capsicum (TIGER BALM PAIN RELIEVING EX) Apply 1 application topically daily as needed (for  pain).    Raynald Blend EXTERNAL MEDICATION Take 2 tablets by mouth daily.    Marland Kitchen ibuprofen (ADVIL,MOTRIN) 200 MG tablet Take 400 mg by mouth every 6 (six) hours as needed for headache or moderate pain.    Marland Kitchen irbesartan (AVAPRO) 75 MG tablet Take 75 mg by mouth daily.    . Multiple Vitamin (MULTIVITAMIN WITH MINERALS) TABS tablet Take 1 tablet by mouth daily.     . naproxen sodium (ALEVE) 220 MG tablet Take 440 mg by mouth daily as needed (for pain).    Marland Kitchen OVER THE COUNTER MEDICATION Take 2 tablets by mouth daily. Fortify Supplement    . polyethylene glycol (MIRALAX / GLYCOLAX) packet Take 17 g by mouth  daily as needed for moderate constipation.     . Probiotic Product (PROBIOTIC DAILY PO) Take 1 tablet by mouth as needed (for health).     . Simethicone (PHAZYME) 180 MG CAPS Take 180-360 mg by mouth daily as needed (for gas).    . traMADol (ULTRAM) 50 MG tablet Take 1-2 tablets (50-100 mg total) by mouth every 6 (six) hours as needed. (Patient not taking: Reported on 10/10/2017) 15 tablet 0  . zaleplon (SONATA) 10 MG capsule Take 10 mg by mouth at bedtime as needed for sleep.     No current facility-administered medications for this visit.     OBJECTIVE: Middle-aged white woman who appears stated age  6:   09/12/18 1559  BP: (!) 151/92  Pulse: 81  Resp: 18  Temp: 97.7 F (36.5 C)  SpO2: 100%     Body mass index is 25 kg/m.   Wt Readings from Last 3 Encounters:  09/12/18 132 lb 4.8 oz (60 kg)  12/30/17 135 lb 11.2 oz (61.6 kg)  10/10/17 136 lb 6.4 oz (61.9 kg)      ECOG FS:0 - Asymptomatic  Sclerae unicteric, pupils round and equal No cervical or supraclavicular adenopathy Lungs no rales or rhonchi Heart regular rate and rhythm Abd soft, nontender, positive bowel sounds MSK no focal spinal tenderness, no upper extremity lymphedema Neuro: nonfocal, well oriented, appropriate affect Breasts: The right breast is status post recent biopsy.  There is extensive ecchymosis.  There is no palpable mass.  There are no other skin or nipple changes of concern.  The left breast status post lumpectomy.  There is no evidence of local recurrence.  Both axillae are benign.  LAB RESULTS:  CMP     Component Value Date/Time   NA 139 09/23/2017 1539   K 3.7 09/23/2017 1539   CL 105 09/23/2017 1539   CO2 25 09/23/2017 1539   GLUCOSE 92 09/23/2017 1539   BUN 14 09/23/2017 1539   CREATININE 0.86 09/23/2017 1539   CALCIUM 9.2 09/23/2017 1539   PROT 7.8 09/23/2017 1539   ALBUMIN 4.3 09/23/2017 1539   AST 25 09/23/2017 1539   ALT 36 09/23/2017 1539   ALKPHOS 101 09/23/2017 1539    BILITOT 0.5 09/23/2017 1539   GFRNONAA >60 09/23/2017 1539   GFRAA >60 09/23/2017 1539    No results found for: TOTALPROTELP, ALBUMINELP, A1GS, A2GS, BETS, BETA2SER, GAMS, MSPIKE, SPEI  No results found for: KPAFRELGTCHN, LAMBDASER, KAPLAMBRATIO  Lab Results  Component Value Date   WBC 7.1 09/23/2017   NEUTROABS 3.1 09/23/2017   HGB 15.0 09/23/2017   HCT 42.6 09/23/2017   MCV 90.9 09/23/2017   PLT 256 09/23/2017    _0 @  No results found for: LABCA2  No components found for: FKCLEX517  No  results for input(s): INR in the last 168 hours.  No results found for: LABCA2  No results found for: TKZ601  No results found for: UXN235  No results found for: TDD220  No results found for: CA2729  No components found for: HGQUANT  No results found for: CEA1 / No results found for: CEA1   No results found for: AFPTUMOR  No results found for: CHROMOGRNA  No results found for: PSA1  No visits with results within 3 Day(s) from this visit.  Latest known visit with results is:  Appointment on 09/23/2017  Component Date Value Ref Range Status  . Sodium 09/23/2017 139  136 - 145 mmol/L Final  . Potassium 09/23/2017 3.7  3.5 - 5.1 mmol/L Final  . Chloride 09/23/2017 105  98 - 109 mmol/L Final  . CO2 09/23/2017 25  22 - 29 mmol/L Final  . Glucose, Bld 09/23/2017 92  70 - 140 mg/dL Final  . BUN 09/23/2017 14  7 - 26 mg/dL Final  . Creatinine 09/23/2017 0.86  0.60 - 1.10 mg/dL Final  . Calcium 09/23/2017 9.2  8.4 - 10.4 mg/dL Final  . Total Protein 09/23/2017 7.8  6.4 - 8.3 g/dL Final  . Albumin 09/23/2017 4.3  3.5 - 5.0 g/dL Final  . AST 09/23/2017 25  5 - 34 U/L Final  . ALT 09/23/2017 36  0 - 55 U/L Final  . Alkaline Phosphatase 09/23/2017 101  40 - 150 U/L Final  . Total Bilirubin 09/23/2017 0.5  0.2 - 1.2 mg/dL Final  . GFR, Est Non Af Am 09/23/2017 >60  >60 mL/min Final  . GFR, Est AFR Am 09/23/2017 >60  >60 mL/min Final   Comment: (NOTE) The eGFR has been  calculated using the CKD EPI equation. This calculation has not been validated in all clinical situations. eGFR's persistently <60 mL/min signify possible Chronic Kidney Disease.   Georgiann Hahn gap 09/23/2017 9  3 - 11 Final   Performed at [facility], [place] [address] [place], [place] [address]  . WBC Count 09/23/2017 7.1  3.9 - 10.3 K/uL Final  . RBC 09/23/2017 4.69  3.70 - 5.45 MIL/uL Final  . Hemoglobin 09/23/2017 15.0  11.6 - 15.9 g/dL Final  . HCT 09/23/2017 42.6  34.8 - 46.6 % Final  . MCV 09/23/2017 90.9  79.5 - 101.0 fL Final  . MCH 09/23/2017 32.0  25.1 - 34.0 pg Final  . MCHC 09/23/2017 35.2  31.5 - 36.0 g/dL Final  . RDW 09/23/2017 12.7  11.2 - 14.5 % Final  . Platelet Count 09/23/2017 256  145 - 400 K/uL Final  . Neutrophils Relative % 09/23/2017 44  % Final  . Neutro Abs 09/23/2017 3.1  1.5 - 6.5 K/uL Final  . Lymphocytes Relative 09/23/2017 47  % Final  . Lymphs Abs 09/23/2017 3.3  0.9 - 3.3 K/uL Final  . Monocytes Relative 09/23/2017 6  % Final  . Monocytes Absolute 09/23/2017 0.4  0.1 - 0.9 K/uL Final  . Eosinophils Relative 09/23/2017 2  % Final  . Eosinophils Absolute 09/23/2017 0.1  0.0 - 0.5 K/uL Final  . Basophils Relative 09/23/2017 1  % Final  . Basophils Absolute 09/23/2017 0.1  0.0 - 0.1 K/uL Final   Performed at [facility], [place] [address], [place], [place] [address]    (this displays the last labs from the last 3 days)  No results found for: TOTALPROTELP, ALBUMINELP, A1GS, A2GS, BETS, BETA2SER, GAMS, MSPIKE, SPEI (  this displays SPEP labs)  No results found for: KPAFRELGTCHN, LAMBDASER, KAPLAMBRATIO (kappa/lambda light chains)  No results found for: HGBA, HGBA2QUANT, HGBFQUANT, HGBSQUAN (Hemoglobinopathy evaluation)   No results found for: LDH  No results found for: IRON, TIBC, IRONPCTSAT (Iron and TIBC)  No results found for: FERRITIN  Urinalysis No results found for: COLORURINE, APPEARANCEUR,  LABSPEC, PHURINE, GLUCOSEU, HGBUR, BILIRUBINUR, KETONESUR, PROTEINUR, UROBILINOGEN, NITRITE, LEUKOCYTESUR   STUDIES: US Breast Ltd Uni Right Inc Axilla  Result Date: 09/02/2018 CLINICAL DATA:  History of a left lumpectomy for DCIS, surgery performed in February 2019. Remote history of bilateral reduction mammoplasties. EXAM: DIGITAL DIAGNOSTIC BILATERAL MAMMOGRAM WITH CAD AND TOMO ULTRASOUND RIGHT BREAST COMPARISON:  Previous exam(s). ACR Breast Density Category b: There are scattered areas of fibroglandular density. FINDINGS: There is central fat density with peripheral punctate calcifications in the lumpectomy bed consistent with fat necrosis. There are no left breast masses or areas of nonsurgical architectural distortion. There are no suspicious calcifications. On the right, there are several calcifications associated with a tubular like area of density, lying lateral to the right nipple. There is no architectural distortion. Mammographic images were processed with CAD. On physical exam, no mass is palpated in the lateral right breast. Targeted ultrasound is performed, showing an elongated hypoechoic lesion consistent with a dilated duct containing debris or possible tissue. It lies at 3 o'clock, 3 cm the nipple, measuring 2.3 x 0.4 x 0.8 cm. There is a smaller similar appearing area that lies adjacent to this. Both areas show small echogenic foci consistent with calcifications. On color Doppler analysis, blood flow is noted along the periphery of the larger lesion. Sonographic evaluation of the right axilla shows no enlarged or abnormal lymph nodes. IMPRESSION: 1. Elongated hypoechoic lesion in the 3 o'clock position of the right breast could reflect DCIS. Given the patient's recent history of left breast DCIS, biopsy is indicated. 2. Benign postsurgical changes on the left. No evidence of recurrent or new left breast carcinoma. RECOMMENDATION: 1. Ultrasound-guided core needle biopsy of the larger  apparent intraductal lesion in the 3 o'clock position of the right breast. I have discussed the findings and recommendations with the patient. Results were also provided in writing at the conclusion of the visit. If applicable, a reminder letter will be sent to the patient regarding the next appointment. BI-RADS CATEGORY  4: Suspicious. Electronically Signed   By: Lajean Manes M.D.   On: 09/02/2018 15:22   Mm Diag Breast Tomo Bilateral  Result Date: 09/02/2018 CLINICAL DATA:  History of a left lumpectomy for DCIS, surgery performed in February 2019. Remote history of bilateral reduction mammoplasties. EXAM: DIGITAL DIAGNOSTIC BILATERAL MAMMOGRAM WITH CAD AND TOMO ULTRASOUND RIGHT BREAST COMPARISON:  Previous exam(s). ACR Breast Density Category b: There are scattered areas of fibroglandular density. FINDINGS: There is central fat density with peripheral punctate calcifications in the lumpectomy bed consistent with fat necrosis. There are no left breast masses or areas of nonsurgical architectural distortion. There are no suspicious calcifications. On the right, there are several calcifications associated with a tubular like area of density, lying lateral to the right nipple. There is no architectural distortion. Mammographic images were processed with CAD. On physical exam, no mass is palpated in the lateral right breast. Targeted ultrasound is performed, showing an elongated hypoechoic lesion consistent with a dilated duct containing debris or possible tissue. It lies at 3 o'clock, 3 cm the nipple, measuring 2.3 x 0.4 x 0.8 cm. There is a smaller similar appearing area that  lies adjacent to this. Both areas show small echogenic foci consistent with calcifications. On color Doppler analysis, blood flow is noted along the periphery of the larger lesion. Sonographic evaluation of the right axilla shows no enlarged or abnormal lymph nodes. IMPRESSION: 1. Elongated hypoechoic lesion in the 3 o'clock position of the  right breast could reflect DCIS. Given the patient's recent history of left breast DCIS, biopsy is indicated. 2. Benign postsurgical changes on the left. No evidence of recurrent or new left breast carcinoma. RECOMMENDATION: 1. Ultrasound-guided core needle biopsy of the larger apparent intraductal lesion in the 3 o'clock position of the right breast. I have discussed the findings and recommendations with the patient. Results were also provided in writing at the conclusion of the visit. If applicable, a reminder letter will be sent to the patient regarding the next appointment. BI-RADS CATEGORY  4: Suspicious. Electronically Signed   By: Lajean Manes M.D.   On: 09/02/2018 15:22   Mm Clip Placement Right  Result Date: 09/05/2018 CLINICAL DATA:  Patient is post ultrasound-guided core needle biopsy of an indeterminate tubular abnormality over the 9 o'clock position of the right breast 3 cm from the nipple. EXAM: DIAGNOSTIC right MAMMOGRAM POST ultrasound BIOPSY COMPARISON:  Previous exam(s). FINDINGS: Mammographic images were obtained following ultrasound guided biopsy of the targeted hypoechoic tubular structure at the 9 o'clock position. Images demonstrate satisfactory placement of a ribbon shaped metallic clip on the CC image as the clip is slightly more superior than expected on the mL image. IMPRESSION: Satisfactory clip placement as described post ultrasound core biopsy right breast tubular abnormality. Final Assessment: Post Procedure Mammograms for Marker Placement Electronically Signed   By: Marin Olp M.D.   On: 09/05/2018 14:51   [place] Rt Breast Bx W Loc Dev 1st Lesion Img Bx Spec US Guide  Addendum Date: 09/09/2018   ADDENDUM REPORT: 09/09/2018 06:46 ADDENDUM: Pathology revealed INTRADUCTAL PAPILLOMA WITH SCLEROSIS AND USUAL DUCTAL HYPERPLASIA, RARE DYSTROPHIC CALCIFICATIONS of the Right breast, 9 o'clock, 3cmfn. This was found to be concordant by Dr. Marin Olp, with excision recommended.  Pathology results were discussed with the patient by telephone. The patient reported doing well after the biopsy with tenderness at the site. Post biopsy instructions and care were reviewed and questions were answered. The patient was encouraged to call [facility] for any additional concerns. Surgical consultation has been arranged with Dr. Nedra Hai, per patient request, at [facility] on September 09, 2018. Dr. Gunnar Bulla Murray Guzzetta was notified of biopsy results via EPIC message, per patient request. Pathology results reported by Terie Purser, RN on 09/09/2018. Electronically Signed   By: Marin Olp M.D.   On: 09/09/2018 06:46   Result Date: 09/09/2018 CLINICAL DATA:  Patient presents for ultrasound-guided core needle biopsy of the right breast. There is a 2.3 cm hypoechoic tubular structure over the 9 o'clock position of the right breast 3 cm from the nipple likely an abnormal duct with internal debris/mass. Note that although the diagnostic report and diagnostic ultrasound images state the abnormality is at the 3 o'clock position, it is actually at the 9 o'clock position. EXAM: ULTRASOUND GUIDED RIGHT BREAST CORE NEEDLE BIOPSY COMPARISON:  Previous exam(s). FINDINGS: I met with the patient and we discussed the procedure of ultrasound-guided biopsy, including benefits and alternatives. We discussed the high likelihood of a successful procedure. We discussed the risks of the procedure, including infection, bleeding, tissue injury, clip migration, and inadequate sampling. Informed written consent was given.  The usual time-out protocol was performed immediately prior to the procedure. Lesion quadrant: Right upper outer quadrant (9-9:30 position). Using sterile technique and 1% Lidocaine as local anesthetic, under direct ultrasound visualization, a 14 gauge spring-loaded device was used to perform biopsy of the abnormal tubular structure over the 9 o'clock position using a  lateral to medial approach. Three adequate tissue specimens were obtained. At the conclusion of the procedure a ribbon shaped tissue marker clip was deployed into the biopsy cavity. Follow up 2 view mammogram was performed and dictated separately. IMPRESSION: Ultrasound guided biopsy of indeterminate right intraductal abnormality. No apparent complications. Electronically Signed: By: Marin Olp M.D. On: 09/05/2018 14:39    ELIGIBLE FOR AVAILABLE RESEARCH PROTOCOL: Decided against the COMET trial   ASSESSMENT: 68 y.o. [place], [place] Woman  (1) s/p left breast upper outer quadrant biopsy 08/27/2017 for ductal carcinoma in situ, grade 2, estrogen and progesterone receptor positive  (2) left lumpectomy with no sentinel lymph node sampling 09/25/2017 confirmed ductal carcinoma in situ, grade 2, measuring 2.1 cm, with negative margins.  (3) patient decided against adjuvant radiation.  (4) patient decided against adjuvant antiestrogens  (5) right breast biopsy 09/05/2018 shows an intraductal papilloma   (a) patient decided against surgery   (b) she is considering intensified screening   PLAN: I spent approximately 45 minutes face to face with [name] with more than 50% of that time spent in counseling and coordination of care.  We discussed the fact that papillomas can be associated with cancer, usually noninvasive, and that the only way to find out whether there is cancer there is to do a lumpectomy.  She has been offered this by Dr. Ninfa Linden who has also explained this.  She understands also that if she does have noninvasive breast cancer radiation would be recommended as was last time.  Which she would like to do is not proceed with standard of care.  She wanted to know the natural history or what would happen if she does not do anything despite having this new finding and I reviewed the fact that we do not have good prospective data on ductal carcinoma in situ which is the reason the Comet  trial is in place.  Certainly I cannot tell her that it will be completely safe for her to leave this lesion in place, I cannot tell her that there is no cancer there, and I cannot tell her that there is no invasive cancer there.  After this discussion I suggested first that she reconsider standard of care, but she really does not want that.  I suggested that she could consider again antiestrogens and we again reviewed the possible toxicity side effects and complications of anastrozole and tamoxifen.  She really does not want "any of that".  We then considered intensified screening.  She would have an MRI of the breast in July of this year and a repeat mammogram next year.  I would not do mammograms twice a year because of the increased radiation involved.  She understands risks regarding false positives and concerns regarding cost.  At this point she was unable to make a definitive decision but she wanted me to go ahead and place the order for an MRI for July.  If she does have the MRI I will see her in August to discuss results.  Otherwise she will have her next mammogram in January of next year and she will see me again next February  She will let me know if she  needs further discussion, has all her questions, or wishes to proceed with standard of care or antiestrogens at any point.    Juliani Laduke, Virgie Dad, MD  09/12/18 5:00 PM Medical Oncology and Hematology [facility] [address] [employer], [place] [address] Tel. [phone]    Fax. [phone]  I, Jacqualyn Posey am acting as a Education administrator for Chauncey Cruel, MD.   I, Lurline Del MD, have reviewed the above documentation for accuracy and completeness, and I agree with the above.

## 2018-09-12 ENCOUNTER — Inpatient Hospital Stay: Payer: PPO | Attending: Oncology | Admitting: Oncology

## 2018-09-12 VITALS — BP 151/92 | HR 81 | Temp 97.7°F | Resp 18 | Wt 132.3 lb

## 2018-09-12 DIAGNOSIS — Z7982 Long term (current) use of aspirin: Secondary | ICD-10-CM | POA: Insufficient documentation

## 2018-09-12 DIAGNOSIS — Z79899 Other long term (current) drug therapy: Secondary | ICD-10-CM | POA: Diagnosis not present

## 2018-09-12 DIAGNOSIS — D241 Benign neoplasm of right breast: Secondary | ICD-10-CM | POA: Insufficient documentation

## 2018-09-12 DIAGNOSIS — Z170 Estrogen receptor positive status [ER+]: Secondary | ICD-10-CM | POA: Diagnosis not present

## 2018-09-12 DIAGNOSIS — I10 Essential (primary) hypertension: Secondary | ICD-10-CM | POA: Insufficient documentation

## 2018-09-12 DIAGNOSIS — D0512 Intraductal carcinoma in situ of left breast: Secondary | ICD-10-CM

## 2018-09-12 DIAGNOSIS — Z1231 Encounter for screening mammogram for malignant neoplasm of breast: Secondary | ICD-10-CM

## 2018-09-12 DIAGNOSIS — Z86000 Personal history of in-situ neoplasm of breast: Secondary | ICD-10-CM | POA: Diagnosis not present

## 2018-09-12 DIAGNOSIS — C50412 Malignant neoplasm of upper-outer quadrant of left female breast: Secondary | ICD-10-CM

## 2018-09-29 ENCOUNTER — Inpatient Hospital Stay: Payer: PPO | Admitting: Oncology

## 2018-10-17 DIAGNOSIS — K5732 Diverticulitis of large intestine without perforation or abscess without bleeding: Secondary | ICD-10-CM | POA: Diagnosis not present

## 2018-10-17 DIAGNOSIS — R1032 Left lower quadrant pain: Secondary | ICD-10-CM | POA: Diagnosis not present

## 2018-10-17 DIAGNOSIS — R109 Unspecified abdominal pain: Secondary | ICD-10-CM | POA: Diagnosis not present

## 2018-10-23 ENCOUNTER — Other Ambulatory Visit: Payer: Self-pay | Admitting: Oncology

## 2018-10-23 DIAGNOSIS — Z1231 Encounter for screening mammogram for malignant neoplasm of breast: Secondary | ICD-10-CM

## 2018-10-23 DIAGNOSIS — C50412 Malignant neoplasm of upper-outer quadrant of left female breast: Secondary | ICD-10-CM

## 2018-10-23 DIAGNOSIS — Z170 Estrogen receptor positive status [ER+]: Principal | ICD-10-CM

## 2018-12-24 ENCOUNTER — Other Ambulatory Visit: Payer: Self-pay

## 2018-12-24 NOTE — Patient Outreach (Signed)
[name] [facility]) Care Management  12/24/2018  [name] Sep 07, 1950 [id number]    [employer] (Engaged Patient) Health Risk Assessment screening completed by nursing staff. Patient referred to [facility] for outreach in 6 months.   PLAN Will follow up in 6 months.   [place] [phone]

## 2019-01-26 ENCOUNTER — Emergency Department (HOSPITAL_COMMUNITY): Payer: PPO

## 2019-01-26 ENCOUNTER — Inpatient Hospital Stay (HOSPITAL_COMMUNITY)
Admission: EM | Admit: 2019-01-26 | Discharge: 2019-01-30 | DRG: 392 | Disposition: A | Discharge status: Home / Self Care | Payer: PPO | Attending: Internal Medicine | Admitting: Internal Medicine

## 2019-01-26 DIAGNOSIS — E785 Hyperlipidemia, unspecified: Secondary | ICD-10-CM | POA: Diagnosis present

## 2019-01-26 DIAGNOSIS — K5732 Diverticulitis of large intestine without perforation or abscess without bleeding: Secondary | ICD-10-CM | POA: Diagnosis not present

## 2019-01-26 DIAGNOSIS — R1030 Lower abdominal pain, unspecified: Secondary | ICD-10-CM | POA: Diagnosis present

## 2019-01-26 DIAGNOSIS — Z66 Do not resuscitate: Secondary | ICD-10-CM | POA: Diagnosis present

## 2019-01-26 DIAGNOSIS — Z03818 Encounter for observation for suspected exposure to other biological agents ruled out: Secondary | ICD-10-CM | POA: Diagnosis not present

## 2019-01-26 DIAGNOSIS — Z1159 Encounter for screening for other viral diseases: Secondary | ICD-10-CM | POA: Diagnosis not present

## 2019-01-26 DIAGNOSIS — K5792 Diverticulitis of intestine, part unspecified, without perforation or abscess without bleeding: Secondary | ICD-10-CM | POA: Diagnosis not present

## 2019-01-26 DIAGNOSIS — Z853 Personal history of malignant neoplasm of breast: Secondary | ICD-10-CM

## 2019-01-26 DIAGNOSIS — R112 Nausea with vomiting, unspecified: Secondary | ICD-10-CM | POA: Diagnosis present

## 2019-01-26 DIAGNOSIS — R350 Frequency of micturition: Secondary | ICD-10-CM | POA: Diagnosis not present

## 2019-01-26 DIAGNOSIS — E781 Pure hyperglyceridemia: Secondary | ICD-10-CM | POA: Diagnosis not present

## 2019-01-26 DIAGNOSIS — R1032 Left lower quadrant pain: Secondary | ICD-10-CM | POA: Diagnosis not present

## 2019-01-26 DIAGNOSIS — Z79899 Other long term (current) drug therapy: Secondary | ICD-10-CM | POA: Diagnosis not present

## 2019-01-26 DIAGNOSIS — R3915 Urgency of urination: Secondary | ICD-10-CM | POA: Diagnosis present

## 2019-01-26 DIAGNOSIS — R17 Unspecified jaundice: Secondary | ICD-10-CM | POA: Diagnosis present

## 2019-01-26 DIAGNOSIS — I10 Essential (primary) hypertension: Secondary | ICD-10-CM | POA: Diagnosis present

## 2019-01-26 DIAGNOSIS — R399 Unspecified symptoms and signs involving the genitourinary system: Secondary | ICD-10-CM | POA: Diagnosis present

## 2019-01-26 DIAGNOSIS — R52 Pain, unspecified: Secondary | ICD-10-CM

## 2019-01-26 DIAGNOSIS — Z803 Family history of malignant neoplasm of breast: Secondary | ICD-10-CM | POA: Diagnosis not present

## 2019-01-26 DIAGNOSIS — N200 Calculus of kidney: Secondary | ICD-10-CM | POA: Diagnosis not present

## 2019-01-26 LAB — CBC
HCT: 43.2 % (ref 36.0–46.0)
Hemoglobin: 14.8 g/dL (ref 12.0–15.0)
MCH: 31.4 pg (ref 26.0–34.0)
MCHC: 34.3 g/dL (ref 30.0–36.0)
MCV: 91.5 fL (ref 80.0–100.0)
Platelets: 271 10*3/uL (ref 150–400)
RBC: 4.72 MIL/uL (ref 3.87–5.11)
RDW: 12.6 % (ref 11.5–15.5)
WBC: 12.1 10*3/uL — ABNORMAL HIGH (ref 4.0–10.5)
nRBC: 0 % (ref 0.0–0.2)

## 2019-01-26 LAB — COMPREHENSIVE METABOLIC PANEL
ALT: 38 U/L (ref 0–44)
AST: 31 U/L (ref 15–41)
Albumin: 3.9 g/dL (ref 3.5–5.0)
Alkaline Phosphatase: 80 U/L (ref 38–126)
Anion gap: 11 (ref 5–15)
BUN: 12 mg/dL (ref 8–23)
CO2: 25 mmol/L (ref 22–32)
Calcium: 8.9 mg/dL (ref 8.9–10.3)
Chloride: 102 mmol/L (ref 98–111)
Creatinine, Ser: 0.88 mg/dL (ref 0.44–1.00)
GFR calc Af Amer: >60 mL/min (ref >60)
GFR calc non Af Amer: >60 mL/min (ref >60)
Glucose, Bld: 128 mg/dL — ABNORMAL HIGH (ref 70–99)
Potassium: 4.1 mmol/L (ref 3.5–5.1)
Sodium: 138 mmol/L (ref 135–145)
Total Bilirubin: 1.3 mg/dL — ABNORMAL HIGH (ref 0.3–1.2)
Total Protein: 7.1 g/dL (ref 6.5–8.1)

## 2019-01-26 LAB — LIPASE, BLOOD: Lipase: 24 U/L (ref 11–51)

## 2019-01-26 MED ORDER — CIPROFLOXACIN IN D5W 400 MG/200ML IV SOLN
400.0000 mg | Freq: Once | INTRAVENOUS | Status: AC
  Administered 2019-01-26: 400 mg via INTRAVENOUS
  Filled 2019-01-26: qty 200

## 2019-01-26 MED ORDER — METRONIDAZOLE IN NACL 5-0.79 MG/ML-% IV SOLN
500.0000 mg | Freq: Once | INTRAVENOUS | Status: AC
  Administered 2019-01-26: 500 mg via INTRAVENOUS
  Filled 2019-01-26: qty 100

## 2019-01-26 MED ORDER — PROMETHAZINE HCL 25 MG/ML IJ SOLN
12.5000 mg | Freq: Once | INTRAMUSCULAR | Status: AC
  Administered 2019-01-26: 12.5 mg via INTRAVENOUS
  Filled 2019-01-26: qty 1

## 2019-01-26 MED ORDER — ONDANSETRON HCL 4 MG/2ML IJ SOLN
4.0000 mg | Freq: Four times a day (QID) | INTRAMUSCULAR | Status: DC | PRN
  Administered 2019-01-27 – 2019-01-28 (×3): 4 mg via INTRAVENOUS
  Filled 2019-01-26 (×4): qty 2

## 2019-01-26 MED ORDER — CIPROFLOXACIN HCL 500 MG PO TABS
500.0000 mg | ORAL_TABLET | Freq: Once | ORAL | Status: DC
  Filled 2019-01-26: qty 1

## 2019-01-26 MED ORDER — ENOXAPARIN SODIUM 40 MG/0.4ML ~~LOC~~ SOLN
40.0000 mg | SUBCUTANEOUS | Status: DC
  Administered 2019-01-27 – 2019-01-30 (×4): 40 mg via SUBCUTANEOUS
  Filled 2019-01-26 (×4): qty 0.4

## 2019-01-26 MED ORDER — ACETAMINOPHEN 650 MG RE SUPP: 650.0000 mg | Freq: Four times a day (QID) | RECTAL | Status: DC | PRN

## 2019-01-26 MED ORDER — MORPHINE SULFATE (PF) 2 MG/ML IV SOLN
1.0000 mg | INTRAVENOUS | Status: DC | PRN
  Administered 2019-01-27 (×5): 1 mg via INTRAVENOUS
  Filled 2019-01-26 (×5): qty 1

## 2019-01-26 MED ORDER — CIPROFLOXACIN IN D5W 400 MG/200ML IV SOLN
400.0000 mg | Freq: Two times a day (BID) | INTRAVENOUS | Status: DC
  Administered 2019-01-27 – 2019-01-28 (×3): 400 mg via INTRAVENOUS
  Filled 2019-01-26 (×3): qty 200

## 2019-01-26 MED ORDER — SODIUM CHLORIDE 0.9% FLUSH
3.0000 mL | Freq: Once | INTRAVENOUS | Status: AC
  Administered 2019-01-26: 3 mL via INTRAVENOUS

## 2019-01-26 MED ORDER — MORPHINE SULFATE (PF) 4 MG/ML IV SOLN
4.0000 mg | Freq: Once | INTRAVENOUS | Status: AC
  Administered 2019-01-26: 4 mg via INTRAVENOUS
  Filled 2019-01-26: qty 1

## 2019-01-26 MED ORDER — IOHEXOL 300 MG/ML  SOLN
100.0000 mL | Freq: Once | INTRAMUSCULAR | Status: AC | PRN
  Administered 2019-01-26: 100 mL via INTRAVENOUS

## 2019-01-26 MED ORDER — SODIUM CHLORIDE 0.9 % IV SOLN
INTRAVENOUS | Status: DC
  Administered 2019-01-26 – 2019-01-28 (×4): via INTRAVENOUS

## 2019-01-26 MED ORDER — ACETAMINOPHEN 325 MG PO TABS
650.0000 mg | ORAL_TABLET | Freq: Four times a day (QID) | ORAL | Status: DC | PRN
  Administered 2019-01-27 – 2019-01-29 (×2): 650 mg via ORAL
  Filled 2019-01-26 (×2): qty 2

## 2019-01-26 MED ORDER — KETOROLAC TROMETHAMINE 15 MG/ML IJ SOLN
15.0000 mg | Freq: Once | INTRAMUSCULAR | Status: AC
  Administered 2019-01-26: 15 mg via INTRAVENOUS
  Filled 2019-01-26: qty 1

## 2019-01-26 MED ORDER — HYDROMORPHONE HCL 1 MG/ML IJ SOLN
0.7500 mg | Freq: Once | INTRAMUSCULAR | Status: AC
  Administered 2019-01-26: 0.75 mg via INTRAVENOUS
  Filled 2019-01-26: qty 1

## 2019-01-26 MED ORDER — METRONIDAZOLE IN NACL 5-0.79 MG/ML-% IV SOLN
500.0000 mg | Freq: Three times a day (TID) | INTRAVENOUS | Status: DC
  Administered 2019-01-27 – 2019-01-28 (×4): 500 mg via INTRAVENOUS
  Filled 2019-01-26 (×4): qty 100

## 2019-01-26 MED ORDER — ONDANSETRON HCL 4 MG/2ML IJ SOLN
4.0000 mg | Freq: Once | INTRAMUSCULAR | Status: AC
  Administered 2019-01-26: 4 mg via INTRAVENOUS
  Filled 2019-01-26: qty 2

## 2019-01-26 NOTE — ED Triage Notes (Signed)
Pt here from home with c/o llq abd pain along with some n/v pt does have a history of diverticula

## 2019-01-26 NOTE — ED Provider Notes (Signed)
[place] EMERGENCY DEPARTMENT Provider Note   CSN: [id number] Arrival date & time: 01/26/19  1751     History   Chief Complaint Chief Complaint  Patient presents with  . Abdominal Pain    HPI [name] is a 68 y.o. female.     HPI   68 year old female with abdominal pain.  Left lower quadrant.  Symptom onset about 2 days ago.  Pain is been constant and progressive since that time.  Waxes and wanes but does not completely go away.  Worse with certain movements.  No urinary complaints.  No acute change in her bowel movements.  No fevers or chills.  Past Medical History:  Diagnosis Date  . Abnormal liver function test   . Arthritis    knees, hands & hips, back   . Cancer ([place])    DCIS- L breast  . Complication of anesthesia   . Depression    post partum, also related to her in her 77's  . Dyslipidemia   . Esophageal spasm    rare occurence , /w ? reflux, no treatment or med. thus far  . Headache    tension , sporadic   . Hypertension   . Hypertriglyceridemia   . IBS (irritable bowel syndrome)   . PONV (postoperative nausea and vomiting)   . Sleep disturbance     Patient Active Problem List   Diagnosis Date Noted  . Papilloma of right breast 09/12/2018  . Malignant neoplasm of upper-outer quadrant of left breast in female, estrogen receptor positive ([place]) 09/22/2017  . Ductal carcinoma in situ (DCIS) of left breast 09/22/2017  . Pain in the chest 11/12/2014  . Essential hypertension 11/12/2014    Past Surgical History:  Procedure Laterality Date  . BREAST LUMPECTOMY Left 09/2017  . BREAST LUMPECTOMY WITH RADIOACTIVE SEED LOCALIZATION Left 09/25/2017   Procedure: LEFT BREAST PARTIAL MASTECTOMY WITH RADIOACTIVE SEED LOCALIZATION ERAS PATHWAY;  Surgeon: Coralie Keens, MD;  Location: [place];  Service: General;  Laterality: Left;  . BREAST REDUCTION SURGERY    . CARDIOVASCULAR STRESS TEST    . CESAREAN SECTION  1984  . KNEE  ARTHROSCOPY Right 2004  . REDUCTION MAMMAPLASTY Bilateral 1997  . ROOT CANAL    . SHOULDER SURGERY Right    2012- arthroscopy  . TONSILLECTOMY       OB History   No obstetric history on file.      Home Medications    Prior to Admission medications   Medication Sig Start Date End Date Taking? Authorizing Provider  acetaminophen (TYLENOL) 325 MG tablet Take 650 mg by mouth every 6 (six) hours as needed for moderate pain or headache.    [provider]  aspirin-acetaminophen-caffeine (EXCEDRIN MIGRAINE) [phone] MG tablet Take 2 tablets by mouth every 6 (six) hours as needed for headache.    [provider]  Camphor-Menthol-Capsicum (TIGER BALM PAIN RELIEVING EX) Apply 1 application topically daily as needed (for pain).    [provider]  GENERIC EXTERNAL MEDICATION Take 2 tablets by mouth daily.    [provider]  ibuprofen (ADVIL,MOTRIN) 200 MG tablet Take 400 mg by mouth every 6 (six) hours as needed for headache or moderate pain.    [provider]  irbesartan (AVAPRO) 75 MG tablet Take 75 mg by mouth daily.    [provider]  Multiple Vitamin (MULTIVITAMIN WITH MINERALS) TABS tablet Take 1 tablet by mouth daily.     [provider]  naproxen sodium (  ALEVE) 220 MG tablet Take 440 mg by mouth daily as needed (for pain).    [provider]  OVER THE COUNTER MEDICATION Take 2 tablets by mouth daily. Fortify Supplement    [provider]  polyethylene glycol (MIRALAX / GLYCOLAX) packet Take 17 g by mouth daily as needed for moderate constipation.     [provider]  Probiotic Product (PROBIOTIC DAILY PO) Take 1 tablet by mouth as needed (for health).     [provider]  Simethicone (PHAZYME) 180 MG CAPS Take 180-360 mg by mouth daily as needed (for gas).    [provider]  traMADol (ULTRAM) 50 MG tablet Take 1-2 tablets (50-100 mg total) by mouth every 6 (six) hours as  needed. Patient not taking: Reported on 10/10/2017 09/25/17   Coralie Keens, MD  zaleplon (SONATA) 10 MG capsule Take 10 mg by mouth at bedtime as needed for sleep.    [provider]    Family History Family History  Problem Relation Age of Onset  . Cancer Mother   . Breast cancer Mother 10  . Cancer Father     Social History Social History   Tobacco Use  . Smoking status: Never Smoker  . Smokeless tobacco: Never Used  Substance Use Topics  . Alcohol use: No  . Drug use: No     Allergies   Yeast, Fenofibrate, Glycerine [glycerin], Keflex [cephalexin], and Tetracyclines & related   Review of Systems Review of Systems  All systems reviewed and negative, other than as noted in HPI.  Physical Exam Updated Vital Signs BP 140/84   Pulse 82   Temp 98.7 F (37.1 C) (Oral)   Resp 16   SpO2 97%   Physical Exam Vitals signs and nursing note reviewed.  Constitutional:      General: [name] is not in acute distress.    Appearance: [name] is well-developed.  HENT:     Head: Normocephalic and atraumatic.  Eyes:     General:        Right eye: No discharge.        Left eye: No discharge.     Conjunctiva/sclera: Conjunctivae normal.  Neck:     Musculoskeletal: Neck supple.  Cardiovascular:     Rate and Rhythm: Normal rate and regular rhythm.     Heart sounds: Normal heart sounds. No murmur. No friction rub. No gallop.   Pulmonary:     Effort: Pulmonary effort is normal. No respiratory distress.     Breath sounds: Normal breath sounds.  Abdominal:     General: There is no distension.     Palpations: Abdomen is soft.     Tenderness: There is abdominal tenderness.     Comments: Numbness in left lower quadrant without rebound or guarding.  No distention.  Musculoskeletal:        General: No tenderness.  Skin:    General: Skin is warm and dry.  Neurological:     Mental Status: [name] is alert.  Psychiatric:        Behavior: Behavior normal.        Thought  Content: Thought content normal.      ED Treatments / Results  Labs (all labs ordered are listed, but only abnormal results are displayed) Labs Reviewed  COMPREHENSIVE METABOLIC PANEL - Abnormal; Notable for the following components:      Result Value   Glucose, Bld 128 (*)    Total Bilirubin 1.3 (*)    All other components  within normal limits  CBC - Abnormal; Notable for the following components:   WBC 12.1 (*)    All other components within normal limits  LIPASE, BLOOD  URINALYSIS, ROUTINE W REFLEX MICROSCOPIC    EKG    Radiology Ct Abdomen Pelvis W Contrast  Result Date: 01/26/2019 CLINICAL DATA:  Left lower quadrant abdominal pain. EXAM: CT ABDOMEN AND PELVIS WITH CONTRAST TECHNIQUE: Multidetector CT imaging of the abdomen and pelvis was performed using the standard protocol following bolus administration of intravenous contrast. CONTRAST:  168mL OMNIPAQUE IOHEXOL 300 MG/ML  SOLN COMPARISON:  12/18/2010 FINDINGS: Lower chest: The lung bases are clear of acute process. No pleural effusion or pulmonary lesions. The heart is normal in size. No pericardial effusion. The distal esophagus and aorta are unremarkable. Moderate-sized hiatal hernia. Hepatobiliary: No focal hepatic lesions or intrahepatic biliary dilatation. The gallbladder appears normal. No common bile duct dilatation. Pancreas: No mass, inflammation or ductal dilatation. Spleen: Normal size.  No focal lesions. Adrenals/Urinary Tract: The adrenal glands are unremarkable. Small left renal calculi but no obstructing ureteral calculi or bladder calculi. Stomach/Bowel: The stomach, duodenum and small bowel are unremarkable. Moderate stool throughout the colon may suggest constipation. There is mild wall thickening and pericolonic inflammatory changes involving the sigmoid colon which demonstrates severe diverticulosis. Findings consistent with acute uncomplicated diverticulitis. The appendix is normal. Vascular/Lymphatic: The  aorta is normal in caliber. No dissection. The branch vessels are patent. The major venous structures are patent. No mesenteric or retroperitoneal mass or adenopathy. Small scattered lymph nodes are noted. Reproductive: The uterus and ovaries are unremarkable. Other: No pelvic mass or adenopathy. No free pelvic fluid collections. No inguinal mass or adenopathy. No abdominal wall hernia or subcutaneous lesions. Musculoskeletal: No significant bony findings. IMPRESSION: 1. Acute uncomplicated sigmoid colon diverticulitis. 2. Severe underlying sigmoid colon diverticulosis. 3. Moderate-sized hiatal hernia. 4. No abdominal/pelvic mass or adenopathy. Electronically Signed   By: Marijo Sanes M.D.   On: 01/26/2019 21:15    Procedures Procedures (including critical care time)  Medications Ordered in ED Medications  metroNIDAZOLE (FLAGYL) IVPB 500 mg (500 mg Intravenous New Bag/Given 01/26/19 2218)  ciprofloxacin (CIPRO) IVPB 400 mg (has no administration in time range)  morphine 4 MG/ML injection 4 mg (has no administration in time range)  sodium chloride flush (NS) 0.9 % injection 3 mL (3 mLs Intravenous Given 01/26/19 2023)  ketorolac (TORADOL) 15 MG/ML injection 15 mg (15 mg Intravenous Given 01/26/19 2023)  HYDROmorphone (DILAUDID) injection 0.75 mg (0.75 mg Intravenous Given 01/26/19 2019)  ondansetron (ZOFRAN) injection 4 mg (4 mg Intravenous Given 01/26/19 2021)  iohexol (OMNIPAQUE) 300 MG/ML solution 100 mL (100 mLs Intravenous Contrast Given 01/26/19 2049)  promethazine (PHENERGAN) injection 12.5 mg (12.5 mg Intravenous Given 01/26/19 2211)     Initial Impression / Assessment and Plan / ED Course  I have reviewed the triage vital signs and the nursing notes.  Pertinent labs & imaging results that were available during my care of the patient were reviewed by me and considered in my medical decision making (see chart for details).   68 year old female with lower abdominal pain.  Symptoms and CT are  consistent with diverticulitis.  No perforation or abscess.  Patient is still pretty symptomatic with regards to both her pain and [name] is still vomiting.  Do not think [name] can tolerate p.o. antibiotics at this point.  Will admit for ongoing symptomatic treatment.  Final Clinical Impressions(s) / ED Diagnoses   Final diagnoses:  Diverticulitis  ED Discharge Orders    None       Virgel Manifold, MD 01/26/19 2303

## 2019-01-26 NOTE — H&P (Signed)
History and Physical    [name] YQI:[id number] DOB: 1950/11/26 DOA: 01/26/2019  PCP: [name], MD Patient coming from: Home  Chief Complaint: Abdominal pain, vomiting  HPI: [name] is a 68 y.o. female with medical history significant of arthritis, breast cancer, hyperlipidemia, esophageal spasm, hypertension, IBS, and conditions listed below presenting to the hospital for evaluation of abdominal pain and vomiting.  Patient reports 4-day history of left lower quadrant abdominal pain.  States the pain is mostly dull but sometimes sharp.  States she started vomiting today.  Her last bowel movement was 4 days ago.  She continues to pass flatus.  Denies any fevers or chills.  Denies any cough, shortness of breath, or chest pain.  Reports having urinary frequency and urgency for the past few days but no dysuria.   ED Course: Afebrile and hemodynamically stable.  White count 12.1.  T bili borderline elevated at 1.3, remainder of LFTs normal.  Lipase normal.  UA not suggestive of infection.  CT abdomen pelvis showing severe underlying sigmoid colon diverticulosis and acute uncomplicated sigmoid colon diverticulitis.  No perforation or abscess.  Patient continued to vomit in the ED. Received ciprofloxacin, Flagyl, Dilaudid, Toradol, Zofran, and Phenergan.  Admission requested.  Review of Systems:  All systems reviewed and apart from history of presenting illness, are negative.  Past Medical History:  Diagnosis Date   Abnormal liver function test    Arthritis    knees, hands & hips, back    Cancer ([facility])    DCIS- L breast   Complication of anesthesia    Depression    post partum, also related to her in her 20's   Dyslipidemia    Esophageal spasm    rare occurence , /w ? reflux, no treatment or med. thus far   Headache    tension , sporadic    Hypertension    Hypertriglyceridemia    IBS (irritable bowel syndrome)    PONV (postoperative nausea and  vomiting)    Sleep disturbance     Past Surgical History:  Procedure Laterality Date   BREAST LUMPECTOMY Left 09/2017   BREAST LUMPECTOMY WITH RADIOACTIVE SEED LOCALIZATION Left 09/25/2017   Procedure: LEFT BREAST PARTIAL MASTECTOMY WITH RADIOACTIVE SEED LOCALIZATION ERAS PATHWAY;  Surgeon: Coralie Keens, MD;  Location: [place];  Service: General;  Laterality: Left;   BREAST REDUCTION SURGERY     CARDIOVASCULAR STRESS TEST     [place] ARTHROSCOPY Right 2004   REDUCTION MAMMAPLASTY Bilateral 1997   ROOT CANAL     SHOULDER SURGERY Right    2012- arthroscopy   TONSILLECTOMY       reports that she has never smoked. She has never used smokeless tobacco. She reports that she does not drink alcohol or use drugs.  Allergies  Allergen Reactions   Yeast Other (See Comments)    Abdominal pain   Fenofibrate Other (See Comments)    Unknown   Glycerine [Glycerin] Nausea And Vomiting   Keflex [Cephalexin] Nausea And Vomiting   Tetracyclines & Related Nausea And Vomiting    Family History  Problem Relation Age of Onset   Cancer Mother    Breast cancer Mother 62   Cancer Father     Prior to Admission medications   Medication Sig Start Date End Date Taking? Authorizing Provider  acetaminophen (TYLENOL) 325 MG tablet Take 650 mg by mouth every 6 (six) hours as needed for moderate pain or headache.  [provider]  aspirin-acetaminophen-caffeine (EXCEDRIN MIGRAINE) [phone] MG tablet Take 2 tablets by mouth every 6 (six) hours as needed for headache.    [provider]  Camphor-Menthol-Capsicum (TIGER BALM PAIN RELIEVING EX) Apply 1 application topically daily as needed (for pain).    [provider]  GENERIC EXTERNAL MEDICATION Take 2 tablets by mouth daily.    [provider]  ibuprofen (ADVIL,MOTRIN) 200 MG tablet Take 400 mg by mouth every 6 (six) hours as needed for headache or moderate pain.     [provider]  irbesartan (AVAPRO) 75 MG tablet Take 75 mg by mouth daily.    [provider]  Multiple Vitamin (MULTIVITAMIN WITH MINERALS) TABS tablet Take 1 tablet by mouth daily.     [provider]  naproxen sodium (ALEVE) 220 MG tablet Take 440 mg by mouth daily as needed (for pain).    [provider]  OVER THE COUNTER MEDICATION Take 2 tablets by mouth daily. Fortify Supplement    [provider]  polyethylene glycol (MIRALAX / GLYCOLAX) packet Take 17 g by mouth daily as needed for moderate constipation.     [provider]  Probiotic Product (PROBIOTIC DAILY PO) Take 1 tablet by mouth as needed (for health).     [provider]  Simethicone (PHAZYME) 180 MG CAPS Take 180-360 mg by mouth daily as needed (for gas).    [provider]  traMADol (ULTRAM) 50 MG tablet Take 1-2 tablets (50-100 mg total) by mouth every 6 (six) hours as needed. Patient not taking: Reported on 10/10/2017 09/25/17   Coralie Keens, MD  zaleplon (SONATA) 10 MG capsule Take 10 mg by mouth at bedtime as needed for sleep.    [provider]    Physical Exam: Vitals:   01/26/19 2015 01/26/19 2030 01/26/19 2330 01/27/19 0015  BP:  140/84 121/71 123/77  Pulse: 82  84 74  Resp:    18  Temp:    98.1 F (36.7 C)  TempSrc:    Oral  SpO2: 97%  96% 99%  Weight:    61.2 kg    Physical Exam  Constitutional: She is oriented to person, place, and time. She appears well-developed and well-nourished. No distress.  Resting comfortably in a hospital bed  HENT:  Head: Normocephalic.  Mouth/Throat: Oropharynx is clear and moist.  Eyes: Right eye exhibits no discharge. Left eye exhibits no discharge.  Neck: Neck supple.  Cardiovascular: Normal rate, regular rhythm and intact distal pulses.  Pulmonary/Chest: Effort normal and breath sounds normal. No respiratory distress. She has no wheezes. She has no rales.  Abdominal: Soft. Bowel sounds  are normal. She exhibits no distension. There is abdominal tenderness. There is guarding. There is no rebound.  Left lower quadrant tender to palpation with guarding  Musculoskeletal:        General: No edema.  Neurological: She is alert and oriented to person, place, and time.  Skin: Skin is warm and dry. She is not diaphoretic.     Labs on Admission: I have personally reviewed following labs and imaging studies  CBC: Recent Labs  Lab 01/26/19 1816  WBC 12.1*  HGB 14.8  HCT 43.2  MCV 91.5  PLT 720   Basic Metabolic Panel: Recent Labs  Lab 01/26/19 1816  NA 138  K 4.1  CL 102  CO2 25  GLUCOSE 128*  BUN 12  CREATININE 0.88  CALCIUM 8.9   GFR: CrCl cannot be calculated (Unknown ideal weight.).  Liver Function Tests: Recent Labs  Lab 01/26/19 1816  AST 31  ALT 38  ALKPHOS 80  BILITOT 1.3*  PROT 7.1  ALBUMIN 3.9   Recent Labs  Lab 01/26/19 1816  LIPASE 24   No results for input(s): AMMONIA in the last 168 hours. Coagulation Profile: No results for input(s): INR, PROTIME in the last 168 hours. Cardiac Enzymes: No results for input(s): CKTOTAL, CKMB, CKMBINDEX, TROPONINI in the last 168 hours. BNP (last 3 results) No results for input(s): PROBNP in the last 8760 hours. HbA1C: No results for input(s): HGBA1C in the last 72 hours. CBG: No results for input(s): GLUCAP in the last 168 hours. Lipid Profile: No results for input(s): CHOL, HDL, LDLCALC, TRIG, CHOLHDL, LDLDIRECT in the last 72 hours. Thyroid Function Tests: No results for input(s): TSH, T4TOTAL, FREET4, T3FREE, THYROIDAB in the last 72 hours. Anemia Panel: No results for input(s): VITAMINB12, FOLATE, FERRITIN, TIBC, IRON, RETICCTPCT in the last 72 hours. Urine analysis:    Component Value Date/Time   COLORURINE YELLOW 01/26/2019 0034   APPEARANCEUR CLEAR 01/26/2019 0034   LABSPEC >1.046 (H) 01/26/2019 0034   PHURINE 6.0 01/26/2019 0034   GLUCOSEU NEGATIVE 01/26/2019 0034   HGBUR NEGATIVE  01/26/2019 0034   BILIRUBINUR NEGATIVE 01/26/2019 0034   KETONESUR 20 (A) 01/26/2019 0034   PROTEINUR NEGATIVE 01/26/2019 0034   NITRITE NEGATIVE 01/26/2019 0034   LEUKOCYTESUR NEGATIVE 01/26/2019 0034    Radiological Exams on Admission: Ct Abdomen Pelvis W Contrast  Result Date: 01/26/2019 CLINICAL DATA:  Left lower quadrant abdominal pain. EXAM: CT ABDOMEN AND PELVIS WITH CONTRAST TECHNIQUE: Multidetector CT imaging of the abdomen and pelvis was performed using the standard protocol following bolus administration of intravenous contrast. CONTRAST:  155mL OMNIPAQUE IOHEXOL 300 MG/ML  SOLN COMPARISON:  12/18/2010 FINDINGS: Lower chest: The lung bases are clear of acute process. No pleural effusion or pulmonary lesions. The heart is normal in size. No pericardial effusion. The distal esophagus and aorta are unremarkable. Moderate-sized hiatal hernia. Hepatobiliary: No focal hepatic lesions or intrahepatic biliary dilatation. The gallbladder appears normal. No common bile duct dilatation. Pancreas: No mass, inflammation or ductal dilatation. Spleen: Normal size.  No focal lesions. Adrenals/Urinary Tract: The adrenal glands are unremarkable. Small left renal calculi but no obstructing ureteral calculi or bladder calculi. Stomach/Bowel: The stomach, duodenum and small bowel are unremarkable. Moderate stool throughout the colon may suggest constipation. There is mild wall thickening and pericolonic inflammatory changes involving the sigmoid colon which demonstrates severe diverticulosis. Findings consistent with acute uncomplicated diverticulitis. The appendix is normal. Vascular/Lymphatic: The aorta is normal in caliber. No dissection. The branch vessels are patent. The major venous structures are patent. No mesenteric or retroperitoneal mass or adenopathy. Small scattered lymph nodes are noted. Reproductive: The uterus and ovaries are unremarkable. Other: No pelvic mass or adenopathy. No free pelvic fluid  collections. No inguinal mass or adenopathy. No abdominal wall hernia or subcutaneous lesions. Musculoskeletal: No significant bony findings. IMPRESSION: 1. Acute uncomplicated sigmoid colon diverticulitis. 2. Severe underlying sigmoid colon diverticulosis. 3. Moderate-sized hiatal hernia. 4. No abdominal/pelvic mass or adenopathy. Electronically Signed   By: Marijo Sanes M.D.   On: 01/26/2019 21:15   Assessment/Plan Principal Problem:   Acute diverticulitis Active Problems:   Essential hypertension   Total bilirubin, elevated   UTI symptoms  Acute uncomplicated sigmoid colon diverticulitis Afebrile.  White count mildly elevated at 12.1. CT abdomen pelvis showing severe underlying sigmoid colon diverticulosis and acute uncomplicated sigmoid colon diverticulitis.  No perforation or  abscess. -Continue Cipro and Flagyl -IV fluid hydration -Clear liquid diet -IV antiemetic PRN -IV morphine PRN pain, Tylenol PRN -Continue to monitor CBC  Borderline T bili elevation T bili borderline elevated at 1.3, remainder of LFTs normal.  CT without evidence of intrahepatic biliary dilatation, normal gallbladder, and no common bile duct dilatation. -Continue to monitor  UTI symptoms Patient reports having urinary frequency and urgency for the past few days but no dysuria.  UA not suggestive of infection.  Afebrile.  White count mildly elevated at 12.1 in the setting of acute diverticulitis. -Check urine culture  Hypertension -Currently normotensive.  Hydralazine PRN.  Unable to safely order home medications at this time as pharmacy medication reconciliation is pending.  DVT prophylaxis: Lovenox Code Status: Patient wishes to be DNR. Family Communication: No family available at this time. Disposition Plan: Anticipate discharge after clinical improvement. Consults called: None Admission status: It is my clinical opinion that referral for OBSERVATION is reasonable and necessary in this patient based  on the above information provided. The aforementioned taken together are felt to place the patient at high risk for further clinical deterioration. However it is anticipated that the patient may be medically stable for discharge from the hospital within 24 to 48 hours.  The medical decision making on this patient was of high complexity and the patient is at high risk for clinical deterioration, therefore this is a level 3 visit.  Shela Leff MD [facility] Pager [phone]  If 7PM-7AM, please contact night-coverage [email or web address] Password [facility]  01/27/2019, 2:47 AM

## 2019-01-27 ENCOUNTER — Encounter (HOSPITAL_COMMUNITY): Payer: Self-pay | Admitting: Internal Medicine

## 2019-01-27 DIAGNOSIS — E785 Hyperlipidemia, unspecified: Secondary | ICD-10-CM | POA: Diagnosis present

## 2019-01-27 DIAGNOSIS — I10 Essential (primary) hypertension: Secondary | ICD-10-CM

## 2019-01-27 DIAGNOSIS — Z853 Personal history of malignant neoplasm of breast: Secondary | ICD-10-CM | POA: Diagnosis not present

## 2019-01-27 DIAGNOSIS — R1030 Lower abdominal pain, unspecified: Secondary | ICD-10-CM | POA: Diagnosis present

## 2019-01-27 DIAGNOSIS — Z79899 Other long term (current) drug therapy: Secondary | ICD-10-CM | POA: Diagnosis not present

## 2019-01-27 DIAGNOSIS — R17 Unspecified jaundice: Secondary | ICD-10-CM | POA: Diagnosis not present

## 2019-01-27 DIAGNOSIS — R350 Frequency of micturition: Secondary | ICD-10-CM | POA: Diagnosis present

## 2019-01-27 DIAGNOSIS — Z66 Do not resuscitate: Secondary | ICD-10-CM | POA: Diagnosis present

## 2019-01-27 DIAGNOSIS — Z1159 Encounter for screening for other viral diseases: Secondary | ICD-10-CM | POA: Diagnosis not present

## 2019-01-27 DIAGNOSIS — K5732 Diverticulitis of large intestine without perforation or abscess without bleeding: Secondary | ICD-10-CM | POA: Diagnosis present

## 2019-01-27 DIAGNOSIS — K5792 Diverticulitis of intestine, part unspecified, without perforation or abscess without bleeding: Secondary | ICD-10-CM | POA: Diagnosis not present

## 2019-01-27 DIAGNOSIS — Z803 Family history of malignant neoplasm of breast: Secondary | ICD-10-CM | POA: Diagnosis not present

## 2019-01-27 DIAGNOSIS — E781 Pure hyperglyceridemia: Secondary | ICD-10-CM | POA: Diagnosis present

## 2019-01-27 DIAGNOSIS — R3915 Urgency of urination: Secondary | ICD-10-CM | POA: Diagnosis present

## 2019-01-27 DIAGNOSIS — R399 Unspecified symptoms and signs involving the genitourinary system: Secondary | ICD-10-CM | POA: Diagnosis present

## 2019-01-27 LAB — URINALYSIS, ROUTINE W REFLEX MICROSCOPIC
Bilirubin Urine: NEGATIVE
Glucose, UA: NEGATIVE mg/dL
Hgb urine dipstick: NEGATIVE
Ketones, ur: 20 mg/dL — AB
Leukocytes,Ua: NEGATIVE
Nitrite: NEGATIVE
Protein, ur: NEGATIVE mg/dL
Specific Gravity, Urine: >1.046 — ABNORMAL HIGH (ref 1.005–1.030)
pH: 6 (ref 5.0–8.0)

## 2019-01-27 LAB — SARS CORONAVIRUS 2: SARS Coronavirus 2: NOT DETECTED

## 2019-01-27 MED ORDER — HYDROMORPHONE HCL 1 MG/ML IJ SOLN
0.5000 mg | INTRAMUSCULAR | Status: DC | PRN
  Administered 2019-01-27 – 2019-01-29 (×10): 0.5 mg via INTRAVENOUS
  Filled 2019-01-27 (×10): qty 1

## 2019-01-27 MED ORDER — WHITE PETROLATUM EX OINT
TOPICAL_OINTMENT | CUTANEOUS | Status: AC
  Administered 2019-01-27: 12:00:00
  Filled 2019-01-27: qty 28.35

## 2019-01-27 MED ORDER — BOOST / RESOURCE BREEZE PO LIQD CUSTOM
1.0000 | Freq: Three times a day (TID) | ORAL | Status: DC
  Administered 2019-01-28 – 2019-01-30 (×4): 1 via ORAL

## 2019-01-27 MED ORDER — HYDRALAZINE HCL 20 MG/ML IJ SOLN: 5.0000 mg | INTRAMUSCULAR | Status: DC | PRN

## 2019-01-27 MED ORDER — PROMETHAZINE HCL 25 MG/ML IJ SOLN
6.2500 mg | Freq: Four times a day (QID) | INTRAMUSCULAR | Status: DC | PRN
  Administered 2019-01-28 – 2019-01-29 (×4): 6.25 mg via INTRAVENOUS
  Filled 2019-01-27 (×4): qty 1

## 2019-01-27 NOTE — Progress Notes (Signed)
Pt arrived to floor from ED. Pt alert and oriented x4. Oriented to room and call bell. 

## 2019-01-27 NOTE — Progress Notes (Signed)
[facility] PROGRESS NOTE  [name] HYI:[id number] DOB: June 23, 1951 DOA: 01/26/2019 PCP: [name], MD  Assessment/Plan:  Acute uncomplicated sigmoid colon diverticulitis. CT abdomen pelvis showing severe underlying sigmoid colon diverticulosis and acute uncomplicated sigmoid colon diverticulitis.  No perforation or abscess. Afebrile mild leukocytosis. Appears uncomfortable. Frequent belching -Continue Cipro and Flagyl -IV fluid hydration -Clear liquid diet -IV antiemetic PRN -IV morphine PRN pain, Tylenol PRN -Continue to monitor CBC  Borderline T bili elevation T bili borderline elevated at 1.3, remainder of LFTs normal.  CT without evidence of intrahepatic biliary dilatation, normal gallbladder, and no common bile duct dilatation. -Continue to monitor  UTI symptoms.  UA not suggestive of infection.  Afebrile.  White count mildly elevated at 12.1 in the setting of acute diverticulitis. Did complain frequency/urgency. -follow urine culture -antibiotics as above  Hypertension. Controlled - Hydralazine PRN.   Code Status: full Family Communication: husband per phone Disposition Plan: home when ready likely 24-36 hours   Consultants:    Procedures:    Antibiotics:  cipro 6/15>>  Flagyl 6/15>>>  HPI/Subjective: 68 yo hx IBS, htn, breast cancer admitted with diverticulitis. Iv fluids bowel rest, pain management.     Objective: Vitals:   01/27/19 0015 01/27/19 0545  BP: 123/77 118/62  Pulse: 74 77  Resp: 18 18  Temp: 98.1 F (36.7 C) 98.1 F (36.7 C)  SpO2: 99% 95%    Intake/Output Summary (Last 24 hours) at 01/27/2019 1023 Last data filed at 01/27/2019 0445 Gross per 24 hour  Intake 784.97 ml  Output 200 ml  Net 584.97 ml   Filed Weights   01/27/19 0015  Weight: 61.2 kg    Exam:   General:  Awake alert mildly uncomfortable no acute distress  Cardiovascular: rrr no mgr no LE edema  Respiratory: normal effort BS  clear bilaterally no wheeze  Abdomen: slighty distended but soft very sluggish BS mild diffuse tenderness to palpation  Musculoskeletal: joints without swelling/erythema   Data Reviewed: Basic Metabolic Panel: Recent Labs  Lab 01/26/19 1816  NA 138  K 4.1  CL 102  CO2 25  GLUCOSE 128*  BUN 12  CREATININE 0.88  CALCIUM 8.9   Liver Function Tests: Recent Labs  Lab 01/26/19 1816  AST 31  ALT 38  ALKPHOS 80  BILITOT 1.3*  PROT 7.1  ALBUMIN 3.9   Recent Labs  Lab 01/26/19 1816  LIPASE 24   No results for input(s): AMMONIA in the last 168 hours. CBC: Recent Labs  Lab 01/26/19 1816  WBC 12.1*  HGB 14.8  HCT 43.2  MCV 91.5  PLT 271   Cardiac Enzymes: No results for input(s): CKTOTAL, CKMB, CKMBINDEX, TROPONINI in the last 168 hours. BNP (last 3 results) No results for input(s): BNP in the last 8760 hours.  ProBNP (last 3 results) No results for input(s): PROBNP in the last 8760 hours.  CBG: No results for input(s): GLUCAP in the last 168 hours.  Recent Results (from the past 240 hour(s))  SARS Coronavirus 2     Status: None   Collection Time: 01/26/19 11:32 PM  Result Value Ref Range Status   SARS Coronavirus 2 NOT DETECTED NOT DETECTED Final    Comment: (NOTE) SARS-CoV-2 target nucleic acids are NOT DETECTED. The SARS-CoV-2 RNA is generally detectable in upper and lower respiratory specimens during the acute phase of infection.  Negative  results do not preclude SARS-CoV-2 infection, do not rule out co-infections with other pathogens, and should not be used as the  sole basis for treatment or other patient management decisions.  Negative results must be combined with clinical observations, patient history, and epidemiological information. The expected result is Not Detected. Fact Sheet for Patients: http://www.biofiredefense.com/wp-content/uploads/2020/03/BIOFIRE-COVID -19-patients.pdf Fact Sheet for Healthcare  Providers: http://www.biofiredefense.com/wp-content/uploads/2020/03/BIOFIRE-COVID -19-hcp.pdf This test is not yet approved or cleared by the [place] and  has been authorized for detection and/or diagnosis of SARS-CoV-2 by FDA under an Emergency Use Authorization (EUA).  This EUA will remain in effec t (meaning this test can be used) for the duration of  the COVID-19 declaration under Section 564(b)(1) of the Act, 21 U.S.C. section 360bbb-3(b)(1), unless the authorization is terminated or revoked sooner. Performed at [facility], [place] [address] [place], [place] [address]      Studies: Ct Abdomen Pelvis W Contrast  Result Date: 01/26/2019 CLINICAL DATA:  Left lower quadrant abdominal pain. EXAM: CT ABDOMEN AND PELVIS WITH CONTRAST TECHNIQUE: Multidetector CT imaging of the abdomen and pelvis was performed using the standard protocol following bolus administration of intravenous contrast. CONTRAST:  154mL OMNIPAQUE IOHEXOL 300 MG/ML  SOLN COMPARISON:  12/18/2010 FINDINGS: Lower chest: The lung bases are clear of acute process. No pleural effusion or pulmonary lesions. The heart is normal in size. No pericardial effusion. The distal esophagus and aorta are unremarkable. Moderate-sized hiatal hernia. Hepatobiliary: No focal hepatic lesions or intrahepatic biliary dilatation. The gallbladder appears normal. No common bile duct dilatation. Pancreas: No mass, inflammation or ductal dilatation. Spleen: Normal size.  No focal lesions. Adrenals/Urinary Tract: The adrenal glands are unremarkable. Small left renal calculi but no obstructing ureteral calculi or bladder calculi. Stomach/Bowel: The stomach, duodenum and small bowel are unremarkable. Moderate stool throughout the colon may suggest constipation. There is mild wall thickening and pericolonic inflammatory changes involving the sigmoid colon which demonstrates severe diverticulosis. Findings consistent with acute uncomplicated  diverticulitis. The appendix is normal. Vascular/Lymphatic: The aorta is normal in caliber. No dissection. The branch vessels are patent. The major venous structures are patent. No mesenteric or retroperitoneal mass or adenopathy. Small scattered lymph nodes are noted. Reproductive: The uterus and ovaries are unremarkable. Other: No pelvic mass or adenopathy. No free pelvic fluid collections. No inguinal mass or adenopathy. No abdominal wall hernia or subcutaneous lesions. Musculoskeletal: No significant bony findings. IMPRESSION: 1. Acute uncomplicated sigmoid colon diverticulitis. 2. Severe underlying sigmoid colon diverticulosis. 3. Moderate-sized hiatal hernia. 4. No abdominal/pelvic mass or adenopathy. Electronically Signed   By: Marijo Sanes M.D.   On: 01/26/2019 21:15    Scheduled Meds: . enoxaparin (LOVENOX) injection  40 mg Subcutaneous Q24H  . feeding supplement  1 Container Oral TID BM   Continuous Infusions: . sodium chloride 125 mL/hr at 01/27/19 0221  . ciprofloxacin 400 mg (01/27/19 0947)  . metronidazole 500 mg (01/27/19 0530)    Principal Problem:   Acute diverticulitis Active Problems:   Total bilirubin, elevated   Essential hypertension   UTI symptoms    Time spent: 45 minutes   Radene Gunning NP [facility]  If 7PM-7AM, please contact night-coverage at [email or web address], password [facility] 01/27/2019, 10:23 AM  LOS: 0 days

## 2019-01-27 NOTE — Progress Notes (Signed)
Initial Nutrition Assessment  DOCUMENTATION CODES:   Not applicable  INTERVENTION:   -D/c Boost Breeze po TID, each supplement provides 250 kcal and 9 grams of protein -RD will follow for diet advancement and add supplements as appropriate  NUTRITION DIAGNOSIS:   Inadequate oral intake related to altered GI function as evidenced by NPO status.  GOAL:   Patient will meet greater than or equal to 90% of their needs  MONITOR:   Diet advancement, Labs, Weight trends, Skin, I & O's  REASON FOR ASSESSMENT:   Malnutrition Screening Tool    ASSESSMENT:   [name] is a 68 y.o. female with medical history significant of arthritis, breast cancer, hyperlipidemia, esophageal spasm, hypertension, IBS, and conditions listed below presenting to the hospital for evaluation of abdominal pain and vomiting.  Patient reports 4-day history of left lower quadrant abdominal pain.  States the pain is mostly dull but sometimes sharp.  States she started vomiting today.  Her last bowel movement was 4 days ago.  She continues to pass flatus.  Denies any fevers or chills.  Denies any cough, shortness of breath, or chest pain.  Reports having urinary frequency and urgency for the past few days but no dysuria.  Pt admitted with acute diverticulitis.   Reviewed I/O's: +585 ml x 24 hours  UOP: 200 ml x 24 hours  Per MD notes, pt with uncontrolled pain and nausea. Plan to remains NPO and possibly consult GI if pt does not improve.   Pt sleeping soundly at time of visit, with lights turned off in room. Given nausea and pain control, RD deferred interview and assessment at this time.   Reviewed wt hx; pt wt has been stable over the past several years. RD to follow for diet advancement and add supplements when appropriate.   Labs reviewed.   Diet Order:   Diet Order            Diet NPO time specified  Diet effective now              EDUCATION NEEDS:   Not appropriate for education  at this time  Skin:  Skin Assessment: Reviewed RN Assessment  Last BM:  01/22/19  Height:   Ht Readings from Last 1 Encounters:  12/30/17 5\' 1"  (1.549 m)    Weight:   Wt Readings from Last 1 Encounters:  01/27/19 61.2 kg    Ideal Body Weight:  47.7 kg  BMI:  Body mass index is 25.49 kg/m.  Estimated Nutritional Needs:   Kcal:  1650-1850  Protein:  75-90 grams  Fluid:  > 1.6 L    Ninel Abdella A. Jimmye Norman, RD, LDN, [place] Registered Dietitian II Certified Diabetes Care and Education Specialist Pager: [phone] After hours Pager: [phone]

## 2019-01-27 NOTE — Plan of Care (Signed)
  Problem: Safety: Goal: Ability to remain free from injury will improve Outcome: Progressing   Problem: Skin Integrity: Goal: Risk for impaired skin integrity will decrease Outcome: Progressing   

## 2019-01-28 LAB — URINE CULTURE: Culture: NO GROWTH

## 2019-01-28 LAB — GLUCOSE, CAPILLARY: Glucose-Capillary: 97 mg/dL (ref 70–99)

## 2019-01-28 MED ORDER — HYDROMORPHONE HCL 1 MG/ML IJ SOLN
0.5000 mg | Freq: Once | INTRAMUSCULAR | Status: AC
  Administered 2019-01-28: 0.5 mg via INTRAVENOUS
  Filled 2019-01-28: qty 1

## 2019-01-28 MED ORDER — DICLOFENAC SODIUM 1 % TD GEL
2.0000 g | Freq: Four times a day (QID) | TRANSDERMAL | Status: DC
  Administered 2019-01-29 (×2): 2 g via TOPICAL
  Filled 2019-01-28: qty 100

## 2019-01-28 MED ORDER — SODIUM CHLORIDE 0.9 % IV SOLN
3.0000 g | Freq: Four times a day (QID) | INTRAVENOUS | Status: DC
  Administered 2019-01-28 – 2019-01-30 (×9): 3 g via INTRAVENOUS
  Filled 2019-01-28 (×13): qty 3

## 2019-01-28 MED ORDER — DEXTROSE-NACL 5-0.9 % IV SOLN
INTRAVENOUS | Status: DC
  Administered 2019-01-28 – 2019-01-29 (×2): via INTRAVENOUS

## 2019-01-28 NOTE — Progress Notes (Signed)
[facility] PROGRESS NOTE  [name] NWG:[id number] DOB: 29-Dec-1950 DOA: 01/26/2019 PCP: [name], MD  Assessment/Plan:  Acute uncomplicated sigmoid colon diverticulitis. CT abdomen pelvis showing severe underlying sigmoid colon diverticulosisandacute uncomplicated sigmoid colon diverticulitis. No perforation or abscess. Remains afebrile. Patient refusing labs. Some improvement in pain control with dilaudid vs morphine. Continues with persistent nausea. -will change Cipro and Flagyl to unasyn -IV fluid hydration -NPO -IV antiemetic PRN -Continue to monitor CBC as patient allows  Borderline T bili elevation T bili borderline elevated at 1.3, remainder of LFTs normal.CT without evidence of intrahepatic biliary dilatation, normal gallbladder, and no common bile duct dilatation. -Continue to monitor if patient agrees to lab draws.   UTI symptoms. UA not suggestive of infection. Afebrile. White count mildly elevated at 12.1 in the setting of acute diverticulitis on admission.  Did complain frequency/urgency. Urine culture with no growth. -antibiotics as above  Hypertension. Controlled -Hydralazine PRN.  Code Status: dnr Family Communication: husband over phone Disposition Plan: home hopefully 36 hours.    Consultants:    Procedures:    Antibiotics:  Flagyl 6/15>>6/17  cipro 6/15>>6/17  unasyn 6/17>>>  HPI/Subjective: 68 yo hx IBS, htn, breast cancer admitted with diverticulitis. Iv fluids bowel rest, pain management. only marginal improvement pain, continues nausea. Change antibiotics as noted above   Objective: Vitals:   01/27/19 2017 01/28/19 0412  BP: (!) 144/75 130/70  Pulse: 81 81  Resp: 18 14  Temp: 97.9 F (36.6 C) 98.6 F (37 C)  SpO2: 98% 94%    Intake/Output Summary (Last 24 hours) at 01/28/2019 1318 Last data filed at 01/28/2019 0900 Gross per 24 hour  Intake 1377.9 ml  Output -  Net 1377.9 ml   Filed  Weights   01/27/19 0015  Weight: 61.2 kg    Exam:   General:  Awake alert slightly flushed and puffy appearing. No acute distress  Cardiovascular: rrr no  Mgr no LE edema  Respiratory: normal effort BS clear bilaterally no wheeze  Abdomen: non-distended soft +BS mild diffuse tenderness to palpation. No guarding or rebounding.  Musculoskeletal: joints without swelling/erythema   Data Reviewed: Basic Metabolic Panel: Recent Labs  Lab 01/26/19 1816  NA 138  K 4.1  CL 102  CO2 25  GLUCOSE 128*  BUN 12  CREATININE 0.88  CALCIUM 8.9   Liver Function Tests: Recent Labs  Lab 01/26/19 1816  AST 31  ALT 38  ALKPHOS 80  BILITOT 1.3*  PROT 7.1  ALBUMIN 3.9   Recent Labs  Lab 01/26/19 1816  LIPASE 24   No results for input(s): AMMONIA in the last 168 hours. CBC: Recent Labs  Lab 01/26/19 1816  WBC 12.1*  HGB 14.8  HCT 43.2  MCV 91.5  PLT 271   Cardiac Enzymes: No results for input(s): CKTOTAL, CKMB, CKMBINDEX, TROPONINI in the last 168 hours. BNP (last 3 results) No results for input(s): BNP in the last 8760 hours.  ProBNP (last 3 results) No results for input(s): PROBNP in the last 8760 hours.  CBG: No results for input(s): GLUCAP in the last 168 hours.  Recent Results (from the past 240 hour(s))  SARS Coronavirus 2     Status: None   Collection Time: 01/26/19 11:32 PM  Result Value Ref Range Status   SARS Coronavirus 2 NOT DETECTED NOT DETECTED Final    Comment: (NOTE) SARS-CoV-2 target nucleic acids are NOT DETECTED. The SARS-CoV-2 RNA is generally detectable in upper and lower respiratory specimens during the acute phase  of infection.  Negative  results do not preclude SARS-CoV-2 infection, do not rule out co-infections with other pathogens, and should not be used as the sole basis for treatment or other patient management decisions.  Negative results must be combined with clinical observations, patient history, and epidemiological  information. The expected result is Not Detected. Fact Sheet for Patients: http://www.biofiredefense.com/wp-content/uploads/2020/03/BIOFIRE-COVID -19-patients.pdf Fact Sheet for Healthcare Providers: http://www.biofiredefense.com/wp-content/uploads/2020/03/BIOFIRE-COVID -19-hcp.pdf This test is not yet approved or cleared by the [place] and  has been authorized for detection and/or diagnosis of SARS-CoV-2 by FDA under an Emergency Use Authorization (EUA).  This EUA will remain in effec t (meaning this test can be used) for the duration of  the COVID-19 declaration under Section 564(b)(1) of the Act, 21 U.S.C. section 360bbb-3(b)(1), unless the authorization is terminated or revoked sooner. Performed at [facility], [place] [address] [place], [place] [address]   Culture, Urine     Status: None   Collection Time: 01/27/19  4:54 AM   Specimen: Urine, Random  Result Value Ref Range Status   Specimen Description URINE, RANDOM  Final   Special Requests NONE  Final   Culture   Final    NO GROWTH Performed at [facility], [place] [address] [place], [place] [address]    Report Status 01/28/2019 FINAL  Final     Studies: Ct Abdomen Pelvis W Contrast  Result Date: 01/26/2019 CLINICAL DATA:  Left lower quadrant abdominal pain. EXAM: CT ABDOMEN AND PELVIS WITH CONTRAST TECHNIQUE: Multidetector CT imaging of the abdomen and pelvis was performed using the standard protocol following bolus administration of intravenous contrast. CONTRAST:  161mL OMNIPAQUE IOHEXOL 300 MG/ML  SOLN COMPARISON:  12/18/2010 FINDINGS: Lower chest: The lung bases are clear of acute process. No pleural effusion or pulmonary lesions. The heart is normal in size. No pericardial effusion. The distal esophagus and aorta are unremarkable. Moderate-sized hiatal hernia. Hepatobiliary: No focal hepatic lesions or intrahepatic biliary dilatation. The gallbladder appears normal. No common bile duct dilatation.  Pancreas: No mass, inflammation or ductal dilatation. Spleen: Normal size.  No focal lesions. Adrenals/Urinary Tract: The adrenal glands are unremarkable. Small left renal calculi but no obstructing ureteral calculi or bladder calculi. Stomach/Bowel: The stomach, duodenum and small bowel are unremarkable. Moderate stool throughout the colon may suggest constipation. There is mild wall thickening and pericolonic inflammatory changes involving the sigmoid colon which demonstrates severe diverticulosis. Findings consistent with acute uncomplicated diverticulitis. The appendix is normal. Vascular/Lymphatic: The aorta is normal in caliber. No dissection. The branch vessels are patent. The major venous structures are patent. No mesenteric or retroperitoneal mass or adenopathy. Small scattered lymph nodes are noted. Reproductive: The uterus and ovaries are unremarkable. Other: No pelvic mass or adenopathy. No free pelvic fluid collections. No inguinal mass or adenopathy. No abdominal wall hernia or subcutaneous lesions. Musculoskeletal: No significant bony findings. IMPRESSION: 1. Acute uncomplicated sigmoid colon diverticulitis. 2. Severe underlying sigmoid colon diverticulosis. 3. Moderate-sized hiatal hernia. 4. No abdominal/pelvic mass or adenopathy. Electronically Signed   By: Marijo Sanes M.D.   On: 01/26/2019 21:15    Scheduled Meds: . diclofenac sodium  2 g Topical QID  . enoxaparin (LOVENOX) injection  40 mg Subcutaneous Q24H  . feeding supplement  1 Container Oral TID BM   Continuous Infusions: . sodium chloride 75 mL/hr at 01/28/19 0321  . ampicillin-sulbactam (UNASYN) IV 3 g (01/28/19 1049)    Principal Problem:   Acute diverticulitis Active Problems:   Total bilirubin, elevated  Essential hypertension   UTI symptoms    Time spent: 43 minutes    Radene Gunning NP  [facility]  If 7PM-7AM, please contact night-coverage at [email or web address], password [facility] 01/28/2019, 1:18 PM  LOS:  1 day

## 2019-01-28 NOTE — Plan of Care (Signed)
  Problem: Elimination: Goal: Will not experience complications related to bowel motility Outcome: Progressing Goal: Will not experience complications related to urinary retention Outcome: Progressing   Problem: Safety: Goal: Ability to remain free from injury will improve Outcome: Progressing   Problem: Skin Integrity: Goal: Risk for impaired skin integrity will decrease Outcome: Progressing   

## 2019-01-28 NOTE — Consult Note (Signed)
   [facility] Inpatient Consult   01/28/2019  Gabreille Dardis September 13, 1950 [id number]    Made aware by [facility] community care coordinator of patient's hospitalization. Patient has [employer] plan and is currently active with [facility] CM community care coordinator for the [employer] HRA ([place]) screening.    Chart reviewed and MD History and physical on 01/26/19 states as follows : Ms. Manessa Buley is a 68 y.o. female with medical history significant of arthritis, breast cancer, hyperlipidemia, esophageal spasm, hypertension, IBS, and conditions listed below-- presenting to the hospital for evaluation of abdominal pain and vomiting.  Patient reports 4-day history of left lower quadrant abdominal pain.   (acute diverticulitis)  Patient's primary care provider is Dr. [name] with [place] [facility], listed as providing transition of care follow-up.  Patient will be followed by an external care management group ([facility]) post hospitalization to follow-up needs and for continued case management services.   Of note, [facility] Care Management services does not replace or interfere with any services that are needed or arranged by inpatient transition of care case management or social work.    For additional questions or referrals, please contact:  Edwena Felty A. Kairav Russomanno, BSN, RN-BC [facility] Liaison Cell: [phone]

## 2019-01-28 NOTE — Progress Notes (Signed)
Pharmacy Antibiotic Note  Marletta Bousquet Carnero is a 68 y.o. female admitted on 01/26/2019 with Intra-abdominal infection.  Pharmacy has been consulted for Unasyn dosing. CrCl~57 ml/min. WBC 12.1, afebrile  Plan: Unasyn 3gm IV q6h Monitor clinical course, LOT, and de-escalation  Weight: 134 lb 14.7 oz (61.2 kg)  Temp (24hrs), Avg:98.4 F (36.9 C), Min:97.9 F (36.6 C), Max:98.8 F (37.1 C)  Recent Labs  Lab 01/26/19 1816  WBC 12.1*  CREATININE 0.88    CrCl cannot be calculated (Unknown ideal weight.).    Allergies  Allergen Reactions  . Yeast Other (See Comments)    Abdominal pain  . Fenofibrate Other (See Comments)    Unknown  . Glycerine [Glycerin] Nausea And Vomiting  . Keflex [Cephalexin] Nausea And Vomiting  . Tetracyclines & Related Nausea And Vomiting   Darian Ace A. Levada Dy, PharmD, [place] Please utilize Amion for appropriate phone number to reach the unit pharmacist ([place])    01/28/2019 10:16 AM

## 2019-01-29 ENCOUNTER — Inpatient Hospital Stay (HOSPITAL_COMMUNITY): Payer: PPO

## 2019-01-29 DIAGNOSIS — R112 Nausea with vomiting, unspecified: Secondary | ICD-10-CM | POA: Diagnosis present

## 2019-01-29 LAB — BASIC METABOLIC PANEL
Anion gap: 7 (ref 5–15)
BUN: 9 mg/dL (ref 8–23)
CO2: 25 mmol/L (ref 22–32)
Calcium: 7.8 mg/dL — ABNORMAL LOW (ref 8.9–10.3)
Chloride: 108 mmol/L (ref 98–111)
Creatinine, Ser: 0.8 mg/dL (ref 0.44–1.00)
GFR calc Af Amer: >60 mL/min (ref >60)
GFR calc non Af Amer: >60 mL/min (ref >60)
Glucose, Bld: 113 mg/dL — ABNORMAL HIGH (ref 70–99)
Potassium: 3.6 mmol/L (ref 3.5–5.1)
Sodium: 140 mmol/L (ref 135–145)

## 2019-01-29 LAB — CBC
HCT: 34.8 % — ABNORMAL LOW (ref 36.0–46.0)
Hemoglobin: 12 g/dL (ref 12.0–15.0)
MCH: 31.3 pg (ref 26.0–34.0)
MCHC: 34.5 g/dL (ref 30.0–36.0)
MCV: 90.6 fL (ref 80.0–100.0)
Platelets: 214 10*3/uL (ref 150–400)
RBC: 3.84 MIL/uL — ABNORMAL LOW (ref 3.87–5.11)
RDW: 12.6 % (ref 11.5–15.5)
WBC: 8.2 10*3/uL (ref 4.0–10.5)
nRBC: 0 % (ref 0.0–0.2)

## 2019-01-29 NOTE — Progress Notes (Signed)
[facility] PROGRESS NOTE  [name] QIH:[id number] DOB: [birth year] DOA: 01/26/2019 PCP: [name], MD  Assessment/Plan: Acute uncomplicated sigmoid colon diverticulitis.CT abdomen pelvis showing severe underlying sigmoid colon diverticulosisandacute uncomplicated sigmoid colon diverticulitis. No perforation or abscess. Remains afebrile. No leukocytosis. Requiring less pain medicine last 24 hours. Continues with nausea but no vomiting. Abdomen remains soft but slightly more distended and even more sluggish BS. Has showered and ambulated hall yesterday. Passing flatus. -kub -trial clear liquids -if tolerates clears will wean off IV pain med to po.  -continue  unasyn -IV fluid hydration -IV antiemetic PRN -Continue to monitor CBC as patient allows  Borderline T bili elevation T bili borderline elevated at 1.3, remainder of LFTs normal.CT without evidence of intrahepatic biliary dilatation, normal gallbladder, and no common bile duct dilatation.  UTI symptoms.UA not suggestive of infection. Afebrile. White count mildly elevated at 12.1 in the setting of acute diverticulitis on admission. Did complain frequency/urgency. Urine culture with no growth. -antibiotics as above  Hypertension. Controlled -Hydralazine PRN.  Code Status: full Family Communication: husband on phone Disposition Plan: home when ready hopefully 36 hours   Consultants:    Procedures:    Antibiotics:  Flagyl 6/15>>6/17  cipro 6/15>>6/17  unasyn 6/17>>>  HPI/Subjective: 68 yo hx ibs, htn, breast cancer admitted with diverticulitis. Slow to progress. Antibiotics changed 2 days ago. Requiring less IV pain meds. Continues with pain/nausea. Advance to clears obtain kub  Objective: Vitals:   01/28/19 2114 01/29/19 0528  BP: (!) 141/72 125/71  Pulse: 86 82  Resp: 16 16  Temp: 99 F (37.2 C) 99 F (37.2 C)  SpO2: 96% 94%    Intake/Output Summary (Last 24 hours)  at 01/29/2019 0942 Last data filed at 01/28/2019 1700 Gross per 24 hour  Intake 932.32 ml  Output 300 ml  Net 632.32 ml   Filed Weights   01/27/19 0015  Weight: 61.2 kg    Exam:   General:  Appears well awake smiling no acute distress  Cardiovascular: rrr no mgr no LE edema  Respiratory: normal effort BS clear bilaterally no wheeze  Abdomen: slightly firm very sluggish BS moderate tenderness LLQ to palpation.   Musculoskeletal: joints without swelling/erythema   Data Reviewed: Basic Metabolic Panel: Recent Labs  Lab 01/26/19 1816 01/29/19 0739  NA 138 140  K 4.1 3.6  CL 102 108  CO2 25 25  GLUCOSE 128* 113*  BUN 12 9  CREATININE 0.88 0.80  CALCIUM 8.9 7.8*   Liver Function Tests: Recent Labs  Lab 01/26/19 1816  AST 31  ALT 38  ALKPHOS 80  BILITOT 1.3*  PROT 7.1  ALBUMIN 3.9   Recent Labs  Lab 01/26/19 1816  LIPASE 24   No results for input(s): AMMONIA in the last 168 hours. CBC: Recent Labs  Lab 01/26/19 1816 01/29/19 0739  WBC 12.1* 8.2  HGB 14.8 12.0  HCT 43.2 34.8*  MCV 91.5 90.6  PLT 271 214   Cardiac Enzymes: No results for input(s): CKTOTAL, CKMB, CKMBINDEX, TROPONINI in the last 168 hours. BNP (last 3 results) No results for input(s): BNP in the last 8760 hours.  ProBNP (last 3 results) No results for input(s): PROBNP in the last 8760 hours.  CBG: Recent Labs  Lab 01/28/19 1325  GLUCAP 97    Recent Results (from the past 240 hour(s))  SARS Coronavirus 2     Status: None   Collection Time: 01/26/19 11:32 PM  Result Value Ref Range Status   SARS Coronavirus  2 NOT DETECTED NOT DETECTED Final    Comment: (NOTE) SARS-CoV-2 target nucleic acids are NOT DETECTED. The SARS-CoV-2 RNA is generally detectable in upper and lower respiratory specimens during the acute phase of infection.  Negative  results do not preclude SARS-CoV-2 infection, do not rule out co-infections with other pathogens, and should not be used as the sole  basis for treatment or other patient management decisions.  Negative results must be combined with clinical observations, patient history, and epidemiological information. The expected result is Not Detected. Fact Sheet for Patients: http://www.biofiredefense.com/wp-content/uploads/2020/03/BIOFIRE-COVID -19-patients.pdf Fact Sheet for Healthcare Providers: http://www.biofiredefense.com/wp-content/uploads/2020/03/BIOFIRE-COVID -19-hcp.pdf This test is not yet approved or cleared by the [place] and  has been authorized for detection and/or diagnosis of SARS-CoV-2 by FDA under an Emergency Use Authorization (EUA).  This EUA will remain in effec t (meaning this test can be used) for the duration of  the COVID-19 declaration under Section 564(b)(1) of the Act, 21 U.S.C. section 360bbb-3(b)(1), unless the authorization is terminated or revoked sooner. Performed at [facility], [place] [address] [place], [place] [address]   Culture, Urine     Status: None   Collection Time: 01/27/19  4:54 AM   Specimen: Urine, Random  Result Value Ref Range Status   Specimen Description URINE, RANDOM  Final   Special Requests NONE  Final   Culture   Final    NO GROWTH Performed at [facility], [place] [address], [place], [place] [address]    Report Status 01/28/2019 FINAL  Final     Studies: No results found.  Scheduled Meds: . diclofenac sodium  2 g Topical QID  . enoxaparin (LOVENOX) injection  40 mg Subcutaneous Q24H  . feeding supplement  1 Container Oral TID BM   Continuous Infusions: . ampicillin-sulbactam (UNASYN) IV 3 g (01/29/19 0530)  . dextrose 5 % and 0.9% NaCl 50 mL/hr at 01/28/19 1421    Principal Problem:   Acute diverticulitis Active Problems:   Total bilirubin, elevated   Nausea with vomiting   Essential hypertension   UTI symptoms    Time spent: 93 minutes    Radene Gunning NP  [facility]  If 7PM-7AM, please contact night-coverage at  [email or web address], password [email or web address] 01/29/2019, 9:42 AM  LOS: 2 days

## 2019-01-30 LAB — COMPREHENSIVE METABOLIC PANEL
ALT: 24 U/L (ref 0–44)
AST: 25 U/L (ref 15–41)
Albumin: 2.8 g/dL — ABNORMAL LOW (ref 3.5–5.0)
Alkaline Phosphatase: 79 U/L (ref 38–126)
Anion gap: 8 (ref 5–15)
BUN: <5 mg/dL — ABNORMAL LOW (ref 8–23)
CO2: 26 mmol/L (ref 22–32)
Calcium: 7.9 mg/dL — ABNORMAL LOW (ref 8.9–10.3)
Chloride: 106 mmol/L (ref 98–111)
Creatinine, Ser: 0.81 mg/dL (ref 0.44–1.00)
GFR calc Af Amer: >60 mL/min (ref >60)
GFR calc non Af Amer: >60 mL/min (ref >60)
Glucose, Bld: 127 mg/dL — ABNORMAL HIGH (ref 70–99)
Potassium: 2.7 mmol/L — CL (ref 3.5–5.1)
Sodium: 140 mmol/L (ref 135–145)
Total Bilirubin: 0.9 mg/dL (ref 0.3–1.2)
Total Protein: 5.6 g/dL — ABNORMAL LOW (ref 6.5–8.1)

## 2019-01-30 MED ORDER — OXYCODONE-ACETAMINOPHEN 5-325 MG PO TABS: 1.0000 | ORAL_TABLET | ORAL | 0 refills | Status: DC | PRN

## 2019-01-30 MED ORDER — OXYCODONE-ACETAMINOPHEN 5-325 MG PO TABS: 1.0000 | ORAL_TABLET | ORAL | Status: DC | PRN

## 2019-01-30 MED ORDER — POTASSIUM CHLORIDE CRYS ER 20 MEQ PO TBCR
40.0000 meq | EXTENDED_RELEASE_TABLET | Freq: Once | ORAL | Status: AC
  Administered 2019-01-30: 40 meq via ORAL
  Filled 2019-01-30: qty 2

## 2019-01-30 MED ORDER — PSYLLIUM 95 % PO PACK: 1.0000 | PACK | Freq: Every day | ORAL | 1 refills | Status: DC

## 2019-01-30 MED ORDER — AMOXICILLIN-POT CLAVULANATE 875-125 MG PO TABS: 1.0000 | ORAL_TABLET | Freq: Two times a day (BID) | ORAL | 0 refills | Status: AC

## 2019-01-30 MED ORDER — PSYLLIUM 95 % PO PACK
1.0000 | PACK | Freq: Every day | ORAL | Status: DC
  Administered 2019-01-30: 1 via ORAL
  Filled 2019-01-30: qty 1

## 2019-01-30 MED FILL — AMOX-CLAV 875-125 MG TABLET: 875-125 | 4 days supply | Qty: 8 | Fill #0

## 2019-01-30 NOTE — Discharge Instructions (Signed)
Recommend using bulk forming laxatives as Metamucil or Benefiber 1-3 times a day on a daily basis

## 2019-01-30 NOTE — Care Management Important Message (Signed)
Important Message  Patient Details  Name: [name] MRN: [id number] Date of Birth: 04-12-1951   Medicare Important Message Given:        Memory Argue 01/30/2019, 3:02 PM

## 2019-01-30 NOTE — Discharge Summary (Signed)
Physician Discharge Summary  [name] NGE:[id number] DOB: 1950/09/06 DOA: 01/26/2019  PCP: [name], MD  Admit date: 01/26/2019 Discharge date: 01/30/2019  Time spent: 45 minutes  Recommendations for Outpatient Follow-up:  1. Follow up with primary gastroenterologist 1-2 weeks for evaluation of symptoms.    Discharge Diagnoses:  Principal Problem:   Acute diverticulitis Active Problems:   Total bilirubin, elevated   Nausea with vomiting   Essential hypertension   UTI symptoms   Discharge Condition: stable  Diet recommendation: high fiber heart healthy  Filed Weights   01/27/19 0015  Weight: 61.2 kg    History of present illness:  [name] is a 68 y.o. female with medical history significant of arthritis, breast cancer, hyperlipidemia, esophageal spasm, hypertension, IBS, presented to the hospital 6/15 for evaluation of abdominal pain and vomiting.  Patient reported 4-day history of left lower quadrant abdominal pain.  Stated the pain  mostly dull but sometimes sharp.  Stated she started vomiting day of admission.  Her last bowel movement was 4 days prior to admission.  She continued to pass flatus.  Denied any fevers or chills.  Denied any cough, shortness of breath, or chest pain.  Reportd having urinary frequency and urgency for the previous few days.   Hospital Course:  Acute uncomplicated sigmoid colon diverticulitis.CT abdomen pelvis showing severe underlying sigmoid colon diverticulosisandacute uncomplicated sigmoid colon diverticulitis. No perforation or abscess.Remained afebrile. No leukocytosis. Tolerating food and liquids. Has not needed IV pain med. KUB with non-obstructive bowel gas pattern. Discharged with augmenten to complete 7 days. Recommend high fiber diet.   Borderline T bili elevation T bili borderline elevated at 1.3, remainder of LFTs normal.CT without evidence of intrahepatic biliary dilatation, normal gallbladder,  and no common bile duct dilatation. At discharge total bili within limits of normal.   UTI symptoms.UA not suggestive of infection. AfebrileDid complain frequency/urgency.Urine culture with no growth. -antibiotics as above  Hypertension. Controlled   Procedures:  Consultations:    Discharge Exam: Vitals:   01/29/19 1940 01/30/19 0459  BP: (!) 148/80 (!) 153/84  Pulse: 79 77  Resp: 16 18  Temp: 98.9 F (37.2 C) 98.3 F (36.8 C)  SpO2: 98% 97%    General: awake alert oriented x3 Cardiovascular: rrr no mgr no LE edema Respiratory: normal effort BS clear bilaterally no wheeze Abd: non-distended non-tender +BS mild tenderness to palpation particularly LLQ  Discharge Instructions   Discharge Instructions    Call MD for:  persistant nausea and vomiting   Complete by: As directed    Call MD for:  severe uncontrolled pain   Complete by: As directed    Call MD for:  temperature >100.4   Complete by: As directed    Diet - low sodium heart healthy   Complete by: As directed    Discharge instructions   Complete by: As directed    Take medications as prescribed Eat small frequent meals Follow up with gastroenterology 1-2 weeks for evaluation of symptoms.   Increase activity slowly   Complete by: As directed      Allergies as of 01/30/2019      Reactions   Yeast Other (See Comments)   Abdominal pain   Fenofibrate Other (See Comments)   Unknown   Glycerine [glycerin] Nausea And Vomiting   Keflex [cephalexin] Nausea And Vomiting   Tetracyclines & Related Nausea And Vomiting      Medication List    STOP taking these medications   ciprofloxacin 500 MG tablet  Commonly known as: CIPRO   ibuprofen 200 MG tablet Commonly known as: ADVIL   metroNIDAZOLE 500 MG tablet Commonly known as: FLAGYL   naproxen sodium 220 MG tablet Commonly known as: ALEVE   traMADol 50 MG tablet Commonly known as: ULTRAM     TAKE these medications   acetaminophen 325 MG  tablet Commonly known as: TYLENOL Take 650 mg by mouth every 6 (six) hours as needed for moderate pain or headache.   amoxicillin-clavulanate 875-125 MG tablet Commonly known as: Augmentin Take 1 tablet by mouth 2 (two) times daily for 4 days.   aspirin-acetaminophen-caffeine 250-250-65 MG tablet Commonly known as: EXCEDRIN MIGRAINE Take 2 tablets by mouth every 6 (six) hours as needed for headache.   Glucosamine-Chondroitin-MSM 500-400-125 MG Tabs Take 2 tablets by mouth daily.   irbesartan-hydrochlorothiazide 150-12.5 MG tablet Commonly known as: AVALIDE Take 1 tablet by mouth daily.   multivitamin with minerals Tabs tablet Take 1 tablet by mouth daily.   oxyCODONE-acetaminophen 5-325 MG tablet Commonly known as: PERCOCET/ROXICET Take 1 tablet by mouth every 4 (four) hours as needed for moderate pain.   Phazyme 180 MG Caps Generic drug: Simethicone Take 180-360 mg by mouth daily as needed (for gas).   polyethylene glycol 17 g packet Commonly known as: MIRALAX / GLYCOLAX Take 17 g by mouth daily as needed for moderate constipation.   PROBIOTIC DAILY PO Take 1 tablet by mouth as needed (for health).   psyllium 95 % Pack Commonly known as: HYDROCIL/METAMUCIL Take 1 packet by mouth daily.   TIGER BALM PAIN RELIEVING EX Apply 1 application topically daily as needed (for pain).   Voltaren 1 % Gel Generic drug: diclofenac sodium Apply 2 g topically 4 (four) times daily.   zaleplon 10 MG capsule Commonly known as: SONATA Take 10 mg by mouth at bedtime as needed for sleep.      Allergies  Allergen Reactions  . Yeast Other (See Comments)    Abdominal pain  . Fenofibrate Other (See Comments)    Unknown  . Glycerine [Glycerin] Nausea And Vomiting  . Keflex [Cephalexin] Nausea And Vomiting  . Tetracyclines & Related Nausea And Vomiting   Follow-up Information    [name], MD Follow up in 1 week(s).   Specialty: Family Medicine Why: BMP 1 week Contact  information: [place] [address] [phone]        Clarene Essex, MD Follow up in 1 week(s).   Specialty: Gastroenterology Contact information: [address] [place] [address] [phone]            The results of significant diagnostics from this hospitalization (including imaging, microbiology, ancillary and laboratory) are listed below for reference.    Significant Diagnostic Studies: Dg Abd 1 View  Result Date: 01/29/2019 CLINICAL DATA:  LEFT lower quadrant abdominal pain, decreased bowel sounds and increased abdominal distension this morning. EXAM: ABDOMEN - 1 VIEW COMPARISON:  Plain film of the abdomen dated 10/17/2018. CT of the abdomen and pelvis dated 01/26/2019. FINDINGS: Overall bowel gas pattern is nonobstructive. No evidence of soft tissue mass or abnormal fluid collection. No evidence of free intraperitoneal air. Small LEFT renal stone, as demonstrated on recent CT. No evidence of ureteral calculi. No acute or suspicious osseous finding. IMPRESSION: 1. Nonobstructive bowel gas pattern and no evidence of acute intra-abdominal abnormality. 2. LEFT renal stone. Electronically Signed   By: Franki Cabot M.D.   On: 01/29/2019 12:09   Ct Abdomen Pelvis W Contrast  Result Date: 01/26/2019 CLINICAL DATA:  Left lower quadrant abdominal pain. EXAM: CT ABDOMEN AND PELVIS WITH CONTRAST TECHNIQUE: Multidetector CT imaging of the abdomen and pelvis was performed using the standard protocol following bolus administration of intravenous contrast. CONTRAST:  143mL OMNIPAQUE IOHEXOL 300 MG/ML  SOLN COMPARISON:  12/18/2010 FINDINGS: Lower chest: The lung bases are clear of acute process. No pleural effusion or pulmonary lesions. The heart is normal in size. No pericardial effusion. The distal esophagus and aorta are unremarkable. Moderate-sized hiatal hernia. Hepatobiliary: No focal hepatic lesions or intrahepatic biliary dilatation. The gallbladder  appears normal. No common bile duct dilatation. Pancreas: No mass, inflammation or ductal dilatation. Spleen: Normal size.  No focal lesions. Adrenals/Urinary Tract: The adrenal glands are unremarkable. Small left renal calculi but no obstructing ureteral calculi or bladder calculi. Stomach/Bowel: The stomach, duodenum and small bowel are unremarkable. Moderate stool throughout the colon may suggest constipation. There is mild wall thickening and pericolonic inflammatory changes involving the sigmoid colon which demonstrates severe diverticulosis. Findings consistent with acute uncomplicated diverticulitis. The appendix is normal. Vascular/Lymphatic: The aorta is normal in caliber. No dissection. The branch vessels are patent. The major venous structures are patent. No mesenteric or retroperitoneal mass or adenopathy. Small scattered lymph nodes are noted. Reproductive: The uterus and ovaries are unremarkable. Other: No pelvic mass or adenopathy. No free pelvic fluid collections. No inguinal mass or adenopathy. No abdominal wall hernia or subcutaneous lesions. Musculoskeletal: No significant bony findings. IMPRESSION: 1. Acute uncomplicated sigmoid colon diverticulitis. 2. Severe underlying sigmoid colon diverticulosis. 3. Moderate-sized hiatal hernia. 4. No abdominal/pelvic mass or adenopathy. Electronically Signed   By: Marijo Sanes M.D.   On: 01/26/2019 21:15    Microbiology: Recent Results (from the past 240 hour(s))  SARS Coronavirus 2     Status: None   Collection Time: 01/26/19 11:32 PM  Result Value Ref Range Status   SARS Coronavirus 2 NOT DETECTED NOT DETECTED Final    Comment: (NOTE) SARS-CoV-2 target nucleic acids are NOT DETECTED. The SARS-CoV-2 RNA is generally detectable in upper and lower respiratory specimens during the acute phase of infection.  Negative  results do not preclude SARS-CoV-2 infection, do not rule out co-infections with other pathogens, and should not be used as  the sole basis for treatment or other patient management decisions.  Negative results must be combined with clinical observations, patient history, and epidemiological information. The expected result is Not Detected. Fact Sheet for Patients: http://www.biofiredefense.com/wp-content/uploads/2020/03/BIOFIRE-COVID -19-patients.pdf Fact Sheet for Healthcare Providers: http://www.biofiredefense.com/wp-content/uploads/2020/03/BIOFIRE-COVID -19-hcp.pdf This test is not yet approved or cleared by the [place] and  has been authorized for detection and/or diagnosis of SARS-CoV-2 by FDA under an Emergency Use Authorization (EUA).  This EUA will remain in effec t (meaning this test can be used) for the duration of  the COVID-19 declaration under Section 564(b)(1) of the Act, 21 U.S.C. section 360bbb-3(b)(1), unless the authorization is terminated or revoked sooner. Performed at [facility], [place] [address], [place], [place] [address]   Culture, Urine     Status: None   Collection Time: 01/27/19  4:54 AM   Specimen: Urine, Random  Result Value Ref Range Status   Specimen Description URINE, RANDOM  Final   Special Requests NONE  Final   Culture   Final    NO GROWTH Performed at [facility], [place] [address] [place],  [place] [address]    Report Status 01/28/2019 FINAL  Final     Labs: Basic Metabolic Panel: Recent  Labs  Lab 01/26/19 1816 01/29/19 0739 01/30/19 0816  NA 138 140 140  K 4.1 3.6 2.7*  CL 102 108 106  CO2 25 25 26   GLUCOSE 128* 113* 127*  BUN 12 9 <5*  CREATININE 0.88 0.80 0.81  CALCIUM 8.9 7.8* 7.9*   Liver Function Tests: Recent Labs  Lab 01/26/19 1816 01/30/19 0816  AST 31 25  ALT 38 24  ALKPHOS 80 79  BILITOT 1.3* 0.9  PROT 7.1 5.6*  ALBUMIN 3.9 2.8*   Recent Labs  Lab 01/26/19 1816  LIPASE 24   No results for input(s): AMMONIA in the last 168 hours. CBC: Recent Labs  Lab 01/26/19 1816 01/29/19 0739  WBC 12.1* 8.2  HGB  14.8 12.0  HCT 43.2 34.8*  MCV 91.5 90.6  PLT 271 214   Cardiac Enzymes: No results for input(s): CKTOTAL, CKMB, CKMBINDEX, TROPONINI in the last 168 hours. BNP: BNP (last 3 results) No results for input(s): BNP in the last 8760 hours.  ProBNP (last 3 results) No results for input(s): PROBNP in the last 8760 hours.  CBG: Recent Labs  Lab 01/28/19 1325  GLUCAP 97       Signed:  Radene Gunning NP.  [facility] 01/30/2019, 12:33 PM

## 2019-01-30 NOTE — Progress Notes (Signed)
[name] to be D/C'd  per MD order. Discussed with the patient and all questions fully answered.  VSS, Skin clean, dry and intact without evidence of skin break down, no evidence of skin tears noted.  IV catheter discontinued intact. Site without signs and symptoms of complications. Dressing and pressure applied.  An After Visit Summary was printed and given to the patient. Patient received prescription.  D/c education completed with patient/family including follow up instructions, medication list, d/c activities limitations if indicated, with other d/c instructions as indicated by MD - patient able to verbalize understanding, all questions fully answered.   Patient instructed to return to ED, call 911, or call MD for any changes in condition.   Patient to be escorted via [place], and D/C home via private auto.

## 2019-01-30 NOTE — Progress Notes (Signed)
This is a courtesy visit. Known to my partner Dr. Watt Climes as an outpatient.    Last colonoscopy was in 2018.  Currently admitted with acute uncomplicated sigmoid diverticulitis, managed on IV Unasyn.  No leukocytosis as of 03/31/2019, normal hemoglobin, hypokalemia, normal renal function and liver enzymes.  Patient states her abdominal pain has improved to 4 out of 10 in intensity.  Nominal x-ray from yesterday showed nonobstructive bowel gas pattern, left renal stone.  She is more concerned about constipation and not having a bowel movement.  She was started on heart healthy diet today. Spoke with patient's nurse practitioner/primary team, seems to be ready for discharge today.  Patient likely to be discharged in the next 24 hours.  Advised follow-up with Dr. Watt Climes in a week. Recommend high-fiber diet, increase intake of fruits, vegetables, whole grains and at least 60 ounces of water on a regular basis. Recommend using bulk forming laxatives as Metamucil or Benefiber 1-3 times a day on a daily basis.  Ronnette Juniper, MD

## 2019-01-30 NOTE — Plan of Care (Signed)

## 2019-01-30 NOTE — Progress Notes (Signed)
BRIEF PHARMACY NOTE  Pharmacy has been assisting with dosing of Unasyn for acute uncomplicated sigmoid colon diverticulitis. Dosage remains stable at 3g IV q6h and need for further dosage adjustment appears unlikely at present.    Will sign off at this time.  Please reconsult if a change in clinical status warrants re-evaluation of dosage.   Lindell Spar, PharmD, BCPS Clinical Pharmacist 01/30/2019 11:33 AM

## 2019-02-09 ENCOUNTER — Encounter: Payer: Self-pay | Admitting: Oncology

## 2019-02-11 DIAGNOSIS — K5730 Diverticulosis of large intestine without perforation or abscess without bleeding: Secondary | ICD-10-CM | POA: Diagnosis not present

## 2019-02-11 DIAGNOSIS — K210 Gastro-esophageal reflux disease with esophagitis: Secondary | ICD-10-CM | POA: Diagnosis not present

## 2019-03-19 DIAGNOSIS — E559 Vitamin D deficiency, unspecified: Secondary | ICD-10-CM | POA: Diagnosis not present

## 2019-03-19 DIAGNOSIS — D0512 Intraductal carcinoma in situ of left breast: Secondary | ICD-10-CM | POA: Diagnosis not present

## 2019-03-19 DIAGNOSIS — E781 Pure hyperglyceridemia: Secondary | ICD-10-CM | POA: Diagnosis not present

## 2019-03-19 DIAGNOSIS — G479 Sleep disorder, unspecified: Secondary | ICD-10-CM | POA: Diagnosis not present

## 2019-03-19 DIAGNOSIS — I10 Essential (primary) hypertension: Secondary | ICD-10-CM | POA: Diagnosis not present

## 2019-03-19 DIAGNOSIS — K5790 Diverticulosis of intestine, part unspecified, without perforation or abscess without bleeding: Secondary | ICD-10-CM | POA: Diagnosis not present

## 2019-03-23 ENCOUNTER — Ambulatory Visit: Payer: PPO | Admitting: Oncology

## 2019-04-01 DIAGNOSIS — K210 Gastro-esophageal reflux disease with esophagitis: Secondary | ICD-10-CM | POA: Diagnosis not present

## 2019-04-01 DIAGNOSIS — K5790 Diverticulosis of intestine, part unspecified, without perforation or abscess without bleeding: Secondary | ICD-10-CM | POA: Diagnosis not present

## 2019-04-29 DIAGNOSIS — L570 Actinic keratosis: Secondary | ICD-10-CM | POA: Diagnosis not present

## 2019-04-29 DIAGNOSIS — D225 Melanocytic nevi of trunk: Secondary | ICD-10-CM | POA: Diagnosis not present

## 2019-04-29 DIAGNOSIS — L853 Xerosis cutis: Secondary | ICD-10-CM | POA: Diagnosis not present

## 2019-04-29 DIAGNOSIS — D1801 Hemangioma of skin and subcutaneous tissue: Secondary | ICD-10-CM | POA: Diagnosis not present

## 2019-04-29 DIAGNOSIS — L821 Other seborrheic keratosis: Secondary | ICD-10-CM | POA: Diagnosis not present

## 2019-05-11 ENCOUNTER — Telehealth: Payer: Self-pay | Admitting: *Deleted

## 2019-05-11 DIAGNOSIS — M25561 Pain in right knee: Secondary | ICD-10-CM | POA: Diagnosis not present

## 2019-05-11 DIAGNOSIS — K219 Gastro-esophageal reflux disease without esophagitis: Secondary | ICD-10-CM | POA: Diagnosis not present

## 2019-05-11 DIAGNOSIS — I10 Essential (primary) hypertension: Secondary | ICD-10-CM | POA: Diagnosis not present

## 2019-05-11 DIAGNOSIS — D0512 Intraductal carcinoma in situ of left breast: Secondary | ICD-10-CM | POA: Diagnosis not present

## 2019-05-11 DIAGNOSIS — Z79899 Other long term (current) drug therapy: Secondary | ICD-10-CM | POA: Diagnosis not present

## 2019-05-17 ENCOUNTER — Encounter: Payer: Self-pay | Admitting: Oncology

## 2019-05-19 ENCOUNTER — Other Ambulatory Visit: Payer: Self-pay

## 2019-05-19 ENCOUNTER — Ambulatory Visit
Admission: RE | Admit: 2019-05-19 | Discharge: 2019-05-19 | Disposition: A | Discharge status: Home / Self Care | Payer: PPO | Source: Ambulatory Visit | Attending: Oncology | Admitting: Oncology

## 2019-05-19 DIAGNOSIS — N6042 Mammary duct ectasia of left breast: Secondary | ICD-10-CM | POA: Diagnosis not present

## 2019-05-19 DIAGNOSIS — N6041 Mammary duct ectasia of right breast: Secondary | ICD-10-CM | POA: Diagnosis not present

## 2019-05-19 DIAGNOSIS — Z1231 Encounter for screening mammogram for malignant neoplasm of breast: Secondary | ICD-10-CM

## 2019-05-19 MED ORDER — GADOBUTROL 1 MMOL/ML IV SOLN
5.0000 mL | Freq: Once | INTRAVENOUS | Status: AC | PRN
  Administered 2019-05-19: 5 mL via INTRAVENOUS

## 2019-05-21 ENCOUNTER — Other Ambulatory Visit: Payer: PPO

## 2019-05-22 ENCOUNTER — Other Ambulatory Visit: Payer: Self-pay | Admitting: *Deleted

## 2019-05-22 DIAGNOSIS — C50412 Malignant neoplasm of upper-outer quadrant of left female breast: Secondary | ICD-10-CM

## 2019-05-22 DIAGNOSIS — N6320 Unspecified lump in the left breast, unspecified quadrant: Secondary | ICD-10-CM

## 2019-05-22 DIAGNOSIS — N6310 Unspecified lump in the right breast, unspecified quadrant: Secondary | ICD-10-CM

## 2019-05-25 ENCOUNTER — Other Ambulatory Visit: Payer: Self-pay | Admitting: Oncology

## 2019-05-25 ENCOUNTER — Encounter: Payer: Self-pay | Admitting: Oncology

## 2019-05-25 ENCOUNTER — Telehealth: Payer: Self-pay | Admitting: *Deleted

## 2019-05-25 NOTE — Progress Notes (Signed)
I called for next to let her know that she is going to need further work-up and likely further biopsies.

## 2019-05-25 NOTE — Telephone Encounter (Signed)
Spoke with patient regarding her MRI results and the additional imaging that is needed.  Explained to her that someone from [facility] will call her with appointments for her 2nd look ultrasound and possible biopsies.  Patient verbalized understanding.

## 2019-05-27 ENCOUNTER — Other Ambulatory Visit: Payer: Self-pay

## 2019-05-27 ENCOUNTER — Ambulatory Visit
Admission: RE | Admit: 2019-05-27 | Discharge: 2019-05-27 | Disposition: A | Discharge status: Home / Self Care | Payer: PPO | Source: Ambulatory Visit | Attending: Oncology | Admitting: Oncology

## 2019-05-27 DIAGNOSIS — N6320 Unspecified lump in the left breast, unspecified quadrant: Secondary | ICD-10-CM

## 2019-05-27 DIAGNOSIS — C50412 Malignant neoplasm of upper-outer quadrant of left female breast: Secondary | ICD-10-CM

## 2019-05-27 DIAGNOSIS — N6311 Unspecified lump in the right breast, upper outer quadrant: Secondary | ICD-10-CM | POA: Diagnosis not present

## 2019-05-27 DIAGNOSIS — N6313 Unspecified lump in the right breast, lower outer quadrant: Secondary | ICD-10-CM | POA: Diagnosis not present

## 2019-05-27 DIAGNOSIS — Z170 Estrogen receptor positive status [ER+]: Secondary | ICD-10-CM

## 2019-05-27 DIAGNOSIS — N6321 Unspecified lump in the left breast, upper outer quadrant: Secondary | ICD-10-CM | POA: Diagnosis not present

## 2019-05-27 DIAGNOSIS — N6310 Unspecified lump in the right breast, unspecified quadrant: Secondary | ICD-10-CM

## 2019-05-27 DIAGNOSIS — N6312 Unspecified lump in the right breast, upper inner quadrant: Secondary | ICD-10-CM | POA: Diagnosis not present

## 2019-05-29 ENCOUNTER — Encounter: Payer: Self-pay | Admitting: Oncology

## 2019-06-25 ENCOUNTER — Ambulatory Visit: Payer: PPO

## 2019-09-02 NOTE — Telephone Encounter (Signed)
No entry 

## 2019-09-09 ENCOUNTER — Ambulatory Visit: Payer: PPO

## 2019-09-18 ENCOUNTER — Ambulatory Visit: Payer: PPO | Attending: Internal Medicine

## 2019-09-18 DIAGNOSIS — Z23 Encounter for immunization: Secondary | ICD-10-CM

## 2019-09-18 NOTE — Progress Notes (Signed)
   Covid-19 Vaccination Clinic  Name:  [name]    MRN: [id number] DOB: [birth year]  09/18/2019  [name] was observed post Covid-19 immunization for 15 minutes without incidence. She was provided with Vaccine Information Sheet and instruction to access the V-Safe system.   [name] was instructed to call 911 with any severe reactions post vaccine: Marland Kitchen Difficulty breathing  . Swelling of your face and throat  . A fast heartbeat  . A bad rash all over your body  . Dizziness and weakness    Immunizations Administered    Name Date Dose VIS Date Route   Pfizer COVID-19 Vaccine 09/18/2019  2:07 PM 0.3 mL 07/24/2019 Intramuscular   Manufacturer: [place]   Lot: [id number]   [place]: [id number]

## 2019-09-20 NOTE — Progress Notes (Signed)
[name]  [phone] [phone]    ID: [name] DOB: Dec 20, 1950  MR#: [id number]  XLK#:[id number]  Patient Care Team: Lori Caraway, MD as PCP - General (Family Medicine) [name], Lori Dad, MD as Consulting Physician (Oncology) Lori Keens, MD as Consulting Physician (General Surgery) Lori Rudd, MD as Consulting Physician (Radiation Oncology) Lori Hipp, MD as Consulting Physician (Obstetrics and Gynecology) Lori Essex, MD as Consulting Physician (Gastroenterology) Lori Polio, MD as Consulting Physician (Plastic Surgery) OTHER MD:   CHIEF COMPLAINT: estrogen receptor positive DCIS; diffuse papillomatosis  CURRENT TREATMENT: Observation   INTERVAL HISTORY: "[name]" returns today for follow-up of her diffuse papillomatosis and prior history of ductal carcinoma in situ  At her last visit with me in January 2020 we discussed various options in addition to surgery namely antiestrogens and intensified screening.  She was not interested in surgery or antiestrogens but did undergo breast MRI on 05/19/2019 showing: breast composition A; multiple, bilateral dilated ducts containing enhancing intraductal masses-- 22 mm duct in upper-outer right breast with prior biopsy clip correlating with 08/2018 papilloma; two additional enhancing intraductal masses in upper-inner right breast (5 mm) and lower-outer right breast (10 mm); single dilated duct in upper-outer left breast containing 3 linearly-oriented enhancing masses, measuring 13 mm, 7 mm, and 8 mm; no abnormal-appearing lymph nodes.  This was followed by bilateral breast ultrasound on 05/27/2019. This showed: correlates are identified to the bilateral enhancing masses described on the recent breast MRI (two on the right and three on the left), with features suggestive of papillomas though malignancy cannot be excluded.   She was scheduled for a second opinion at [facility]  06/25/2019 but canceled that visit   REVIEW OF SYSTEMS: [name] had her first vaccine as shot without event and is having her second 1 the second week in March.  Her bowels are now working more regularly.  She is doing tai chi on a regular basis as well and she has a treadmill at home that she uses.  She still has discomfort in the surgical breast.  This is not constant or progressive.  Aside from these issues a detailed review of systems today was stable   HISTORY OF CURRENT ILLNESS: From the original intake note:  [name] had routine mammography at [facility] under Dr. Alden Lane on 08/17/2017 that found a possible abnormality in the left breast. She underwent unilateral left diagnostic mammography with tomography at [facility] on 08/26/2017 showing: breast density category B. Grouped calcification in the upper outer quadrant of the left breast at middle depth are suspicious for malignancy.  Accordingly on 08/27/2017 she proceeded to biopsy of the left breast area in question. The pathology from this procedure showed ([id number]): Ductal carcinoma in situ with calcifications. Prognostic indicators significant for: estrogen receptor, 95% positive and progesterone receptor, 90% positive, both with strong staining intensity.   The patient's subsequent history is as detailed below.   PAST MEDICAL HISTORY: Past Medical History:  Diagnosis Date  . Abnormal liver function test   . Arthritis    knees, hands & hips, back   . Cancer ([place])    DCIS- L breast  . Complication of anesthesia   . Depression    post partum, also related to her in her 50's  . Dyslipidemia   . Esophageal spasm    rare occurence , /w ? reflux, no treatment or med. thus far  . Headache    tension , sporadic   . Hypertension   .  Hypertriglyceridemia   . IBS (irritable bowel syndrome)   . PONV (postoperative nausea and vomiting)   . Sleep disturbance   Yeast allergy, HTN   PAST  SURGICAL HISTORY: Past Surgical History:  Procedure Laterality Date  . BREAST LUMPECTOMY Left 09/2017  . BREAST LUMPECTOMY WITH RADIOACTIVE SEED LOCALIZATION Left 09/25/2017   Procedure: LEFT BREAST PARTIAL MASTECTOMY WITH RADIOACTIVE SEED LOCALIZATION ERAS PATHWAY;  Surgeon: Lori Keens, MD;  Location: [facility];  Service: General;  Laterality: Left;  . BREAST REDUCTION SURGERY    . CARDIOVASCULAR STRESS TEST    . CESAREAN SECTION  1984  . KNEE ARTHROSCOPY Right 2004  . REDUCTION MAMMAPLASTY Bilateral 1997  . ROOT CANAL    . SHOULDER SURGERY Right    2012- arthroscopy  . TONSILLECTOMY    C-section 1984. Right shoulder and right knee surgery.   FAMILY HISTORY Family History  Problem Relation Age of Onset  . Cancer Mother   . Breast cancer Mother 67  . Cancer Father   The patient's mother died at age 102 due to breast cancer, diagnosed in her 78s. The patient's father died at age 67 due to colon cancer. The patient has 1 brother and no sisters. She denies a family history of other breast, ovarian, prostate, or colon cancers.    GYNECOLOGIC HISTORY:  No LMP recorded. Patient is postmenopausal. 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 [name], her husband, is retired from being a Dispensing optician in the Therapist, music. The patient's son, [name] "[address] [place], is a Geophysicist/field seismologist in [place], [place]. The patient has no grandchildren. She belongs to the [employer].    ADVANCED DIRECTIVES:    HEALTH MAINTENANCE: Social History   Tobacco Use  . Smoking status: Never Smoker  . Smokeless tobacco: Never Used  Substance Use Topics  . Alcohol use: No  . Drug use: No     Colonoscopy: within 2 years  PAP: 2017  Bone density: more  than 2 years ago: osteopenia (2009) under Dr. Theadore Nan    Allergies  Allergen Reactions  . Yeast Other (See Comments)    Abdominal pain  . Fenofibrate Other (See Comments)    Unknown  . Glycerine [Glycerin] Nausea And Vomiting  . Keflex [Cephalexin] Nausea And Vomiting  . Tetracyclines & Related Nausea And Vomiting    Current Outpatient Medications  Medication Sig Dispense Refill  . acetaminophen (TYLENOL) 325 MG tablet Take 650 mg by mouth every 6 (six) hours as needed for moderate pain or headache.    Marland Kitchen aspirin-acetaminophen-caffeine (EXCEDRIN MIGRAINE) 250-250-65 MG tablet Take 2 tablets by mouth every 6 (six) hours as needed for headache.    . diclofenac sodium (VOLTAREN) 1 % GEL Apply 2 g topically 4 (four) times daily.    . Glucosamine-Chondroitin-MSM 500-400-125 MG TABS Take 2 tablets by mouth daily.    . irbesartan-hydrochlorothiazide (AVALIDE) 150-12.5 MG tablet Take 1 tablet by mouth daily.    . Multiple Vitamin (MULTIVITAMIN WITH MINERALS) TABS tablet Take 1 tablet by mouth daily.     . polyethylene glycol (MIRALAX / GLYCOLAX) packet Take 17 g by mouth daily as needed for moderate constipation.     . Probiotic Product (PROBIOTIC DAILY PO) Take 1 tablet  by mouth as needed (for health).     . Simethicone (PHAZYME) 180 MG CAPS Take 180-360 mg by mouth daily as needed (for gas).    . zaleplon (SONATA) 10 MG capsule Take 10 mg by mouth at bedtime as needed for sleep.     No current facility-administered medications for this visit.    OBJECTIVE: White woman in no acute distress  Vitals:   09/21/19 1440  BP: 130/81  Pulse: 85  Resp: 18  Temp: 98.6 F (37 C)     Body mass index is 26.48 kg/m.   Wt Readings from Last 3 Encounters:  09/21/19 135 lb 9.6 oz (61.5 kg)  01/27/19 134 lb 14.7 oz (61.2 kg)  09/12/18 132 lb 4.8 oz (60 kg)      ECOG FS:0 - Asymptomatic  Sclerae unicteric, EOMs intact Wearing a mask No cervical or supraclavicular adenopathy Lungs no  rales or rhonchi Heart regular rate and rhythm Abd soft, nontender, positive bowel sounds MSK no focal spinal tenderness, no upper extremity lymphedema Neuro: nonfocal, well oriented, appropriate affect Breasts: The right breast is status post reduction mammoplasty.  There are no suspicious findings.  The left breast is status post lumpectomy as well as reduction mammoplasty.  There are no suspicious findings.  Both axillae are benign.   LAB RESULTS:  CMP     Component Value Date/Time   NA 140 01/30/2019 0816   K 2.7 (LL) 01/30/2019 0816   CL 106 01/30/2019 0816   CO2 26 01/30/2019 0816   GLUCOSE 127 (H) 01/30/2019 0816   BUN <5 (L) 01/30/2019 0816   CREATININE 0.81 01/30/2019 0816   CREATININE 0.86 09/23/2017 1539   CALCIUM 7.9 (L) 01/30/2019 0816   PROT 5.6 (L) 01/30/2019 0816   ALBUMIN 2.8 (L) 01/30/2019 0816   AST 25 01/30/2019 0816   AST 25 09/23/2017 1539   ALT 24 01/30/2019 0816   ALT 36 09/23/2017 1539   ALKPHOS 79 01/30/2019 0816   BILITOT 0.9 01/30/2019 0816   BILITOT 0.5 09/23/2017 1539   GFRNONAA >60 01/30/2019 0816   GFRNONAA >60 09/23/2017 1539   GFRAA >60 01/30/2019 0816   GFRAA >60 09/23/2017 1539    No results found for: TOTALPROTELP, ALBUMINELP, A1GS, A2GS, BETS, BETA2SER, GAMS, MSPIKE, SPEI  No results found for: KPAFRELGTCHN, LAMBDASER, KAPLAMBRATIO  Lab Results  Component Value Date   WBC 8.2 01/29/2019   NEUTROABS 3.1 09/23/2017   HGB 12.0 01/29/2019   HCT 34.8 (L) 01/29/2019   MCV 90.6 01/29/2019   PLT 214 01/29/2019   No results found for: LABCA2  No components found for: XVQMGQ676  No results for input(s): INR in the last 168 hours.  No results found for: LABCA2  No results found for: PPJ093  No results found for: OIZ124  No results found for: PYK998  No results found for: CA2729  No components found for: HGQUANT  No results found for: CEA1 / No results found for: CEA1   No results found for: AFPTUMOR  No results found  for: CHROMOGRNA  No results found for: HGBA, HGBA2QUANT, HGBFQUANT, HGBSQUAN (Hemoglobinopathy evaluation)   No results found for: LDH  No results found for: IRON, TIBC, IRONPCTSAT (Iron and TIBC)  No results found for: FERRITIN  Urinalysis    Component Value Date/Time   COLORURINE YELLOW 01/26/2019 0034   APPEARANCEUR CLEAR 01/26/2019 0034   LABSPEC >1.046 (H) 01/26/2019 0034   PHURINE 6.0 01/26/2019 0034   GLUCOSEU NEGATIVE 01/26/2019 0034   HGBUR NEGATIVE  01/26/2019 0034   BILIRUBINUR NEGATIVE 01/26/2019 0034   KETONESUR 20 (A) 01/26/2019 0034   PROTEINUR NEGATIVE 01/26/2019 0034   NITRITE NEGATIVE 01/26/2019 0034   LEUKOCYTESUR NEGATIVE 01/26/2019 0034    STUDIES: No results found.   ELIGIBLE FOR AVAILABLE RESEARCH PROTOCOL: Decided against the COMET trial   ASSESSMENT: 69 y.o. [place], [place] Woman  (1) s/p left breast upper outer quadrant biopsy 08/27/2017 for ductal carcinoma in situ, grade 2, estrogen and progesterone receptor positive  (2) left lumpectomy with no sentinel lymph node sampling 09/25/2017 confirmed ductal carcinoma in situ, grade 2, measuring 2.1 cm, with negative margins.  (3) patient decided against adjuvant radiation.  (4) patient decided against adjuvant antiestrogens  (5) diffuse papillomatosis   (a) right breast biopsy 09/05/2018 shows an intraductal papilloma   (b) breast MRI 05/19/2019 shows multiple bilateral ducts containing enhancing intraductal masses  (c) patient decided against surgery   (d) decided against antiestrogens  (6) intensified screening:  (a) mammography with tomography Naida Sleight April  (b) breast MRI every October   PLAN: [name] is now 2 years out from definitive surgery for her ductal carcinoma in situ, with no evidence of recurrence.  This is favorable.  Her MRI of the breast in October 2020 showed diffuse papillomatosis.  This is a benign condition but it is associated with a high risk of developing breast  cancer, around 3 times the normal risk.  This does qualify [name] for intensified screening.  We also discussed antiestrogens and surgery.  She declines to pursue those options  She will have mammography in April with tomo (3D) and optimally she would see Dr. Leonides Schanz after that to discuss results and also for a breast exam.  She will then have breast MRI in October and see me after that for another breast exam.  I am delighted at her exercise program.  She also will receive her second vaccine shot early March.  She knows to call for any other issue that may develop before the next visit.  Total encounter time 25 minutes.*   Lillyen Schow, Lori Dad, MD  09/21/19 3:03 PM Medical Oncology and Hematology [facility] [place], [place] [address] Tel. [phone]    Fax. [phone]   I, Wilburn Mylar, am acting as scribe for Dr. Virgie Lane. Giovanie Lefebre.  I, Lurline Del MD, have reviewed the above documentation for accuracy and completeness, and I agree with the above.    *Total Encounter Time as defined by the Centers for Medicare and Medicaid Services includes, in addition to the face-to-face time of a patient visit (documented in the note above) non-face-to-face time: obtaining and reviewing outside history, ordering and reviewing medications, tests or procedures, care coordination (communications with other health care professionals or caregivers) and documentation in the medical record.

## 2019-09-21 ENCOUNTER — Other Ambulatory Visit: Payer: Self-pay

## 2019-09-21 ENCOUNTER — Inpatient Hospital Stay: Payer: PPO | Attending: Oncology | Admitting: Oncology

## 2019-09-21 VITALS — BP 130/81 | HR 85 | Temp 98.6°F | Resp 18 | Ht 60.0 in | Wt 135.6 lb

## 2019-09-21 DIAGNOSIS — E785 Hyperlipidemia, unspecified: Secondary | ICD-10-CM | POA: Diagnosis not present

## 2019-09-21 DIAGNOSIS — Z803 Family history of malignant neoplasm of breast: Secondary | ICD-10-CM | POA: Insufficient documentation

## 2019-09-21 DIAGNOSIS — Z809 Family history of malignant neoplasm, unspecified: Secondary | ICD-10-CM | POA: Diagnosis not present

## 2019-09-21 DIAGNOSIS — C50412 Malignant neoplasm of upper-outer quadrant of left female breast: Secondary | ICD-10-CM

## 2019-09-21 DIAGNOSIS — I10 Essential (primary) hypertension: Secondary | ICD-10-CM | POA: Diagnosis not present

## 2019-09-21 DIAGNOSIS — D0512 Intraductal carcinoma in situ of left breast: Secondary | ICD-10-CM

## 2019-09-21 DIAGNOSIS — M129 Arthropathy, unspecified: Secondary | ICD-10-CM | POA: Diagnosis not present

## 2019-09-21 DIAGNOSIS — K589 Irritable bowel syndrome without diarrhea: Secondary | ICD-10-CM | POA: Insufficient documentation

## 2019-09-21 DIAGNOSIS — D249 Benign neoplasm of unspecified breast: Secondary | ICD-10-CM | POA: Insufficient documentation

## 2019-09-21 DIAGNOSIS — D369 Benign neoplasm, unspecified site: Secondary | ICD-10-CM | POA: Diagnosis not present

## 2019-09-21 DIAGNOSIS — E781 Pure hyperglyceridemia: Secondary | ICD-10-CM | POA: Insufficient documentation

## 2019-09-21 DIAGNOSIS — F329 Major depressive disorder, single episode, unspecified: Secondary | ICD-10-CM | POA: Diagnosis not present

## 2019-09-21 DIAGNOSIS — Z86000 Personal history of in-situ neoplasm of breast: Secondary | ICD-10-CM | POA: Diagnosis not present

## 2019-09-21 DIAGNOSIS — Z79899 Other long term (current) drug therapy: Secondary | ICD-10-CM | POA: Insufficient documentation

## 2019-09-21 DIAGNOSIS — Z170 Estrogen receptor positive status [ER+]: Secondary | ICD-10-CM

## 2019-09-22 ENCOUNTER — Telehealth: Payer: Self-pay | Admitting: Oncology

## 2019-09-22 NOTE — Telephone Encounter (Signed)
I left a message regarding schedule  

## 2019-09-28 DIAGNOSIS — Z79899 Other long term (current) drug therapy: Secondary | ICD-10-CM | POA: Diagnosis not present

## 2019-09-28 DIAGNOSIS — G479 Sleep disorder, unspecified: Secondary | ICD-10-CM | POA: Diagnosis not present

## 2019-09-28 DIAGNOSIS — E559 Vitamin D deficiency, unspecified: Secondary | ICD-10-CM | POA: Diagnosis not present

## 2019-09-28 DIAGNOSIS — I10 Essential (primary) hypertension: Secondary | ICD-10-CM | POA: Diagnosis not present

## 2019-09-28 DIAGNOSIS — E781 Pure hyperglyceridemia: Secondary | ICD-10-CM | POA: Diagnosis not present

## 2019-09-28 DIAGNOSIS — D0512 Intraductal carcinoma in situ of left breast: Secondary | ICD-10-CM | POA: Diagnosis not present

## 2019-09-28 DIAGNOSIS — K219 Gastro-esophageal reflux disease without esophagitis: Secondary | ICD-10-CM | POA: Diagnosis not present

## 2019-09-28 DIAGNOSIS — M25561 Pain in right knee: Secondary | ICD-10-CM | POA: Diagnosis not present

## 2019-09-30 ENCOUNTER — Ambulatory Visit: Payer: PPO

## 2019-10-09 DIAGNOSIS — Z0000 Encounter for general adult medical examination without abnormal findings: Secondary | ICD-10-CM | POA: Diagnosis not present

## 2019-10-09 DIAGNOSIS — M85852 Other specified disorders of bone density and structure, left thigh: Secondary | ICD-10-CM | POA: Diagnosis not present

## 2019-10-09 DIAGNOSIS — K219 Gastro-esophageal reflux disease without esophagitis: Secondary | ICD-10-CM | POA: Diagnosis not present

## 2019-10-09 DIAGNOSIS — M85851 Other specified disorders of bone density and structure, right thigh: Secondary | ICD-10-CM | POA: Diagnosis not present

## 2019-10-09 DIAGNOSIS — D0512 Intraductal carcinoma in situ of left breast: Secondary | ICD-10-CM | POA: Diagnosis not present

## 2019-10-09 DIAGNOSIS — I10 Essential (primary) hypertension: Secondary | ICD-10-CM | POA: Diagnosis not present

## 2019-10-09 DIAGNOSIS — E559 Vitamin D deficiency, unspecified: Secondary | ICD-10-CM | POA: Diagnosis not present

## 2019-10-09 DIAGNOSIS — M170 Bilateral primary osteoarthritis of knee: Secondary | ICD-10-CM | POA: Diagnosis not present

## 2019-10-09 DIAGNOSIS — G479 Sleep disorder, unspecified: Secondary | ICD-10-CM | POA: Diagnosis not present

## 2019-10-09 DIAGNOSIS — E781 Pure hyperglyceridemia: Secondary | ICD-10-CM | POA: Diagnosis not present

## 2019-10-09 DIAGNOSIS — Z1389 Encounter for screening for other disorder: Secondary | ICD-10-CM | POA: Diagnosis not present

## 2019-10-14 ENCOUNTER — Ambulatory Visit: Payer: PPO | Attending: Internal Medicine

## 2019-10-14 DIAGNOSIS — Z23 Encounter for immunization: Secondary | ICD-10-CM | POA: Insufficient documentation

## 2019-10-14 NOTE — Progress Notes (Signed)
   Covid-19 Vaccination Clinic  Name:  [name]    MRN: [id number] DOB: 1951/05/15  10/14/2019  [name] was observed post Covid-19 immunization for 15 minutes without incident. She was provided with Vaccine Information Sheet and instruction to access the V-Safe system.   [name] was instructed to call 911 with any severe reactions post vaccine: Marland Kitchen Difficulty breathing  . Swelling of face and throat  . A fast heartbeat  . A bad rash all over body  . Dizziness and weakness   Immunizations Administered    Name Date Dose VIS Date Route   Pfizer COVID-19 Vaccine 10/14/2019 10:17 AM 0.3 mL 07/24/2019 Intramuscular   Manufacturer: [place]   Lot: [id number]   [place]: [id number]

## 2019-12-03 ENCOUNTER — Other Ambulatory Visit: Payer: Self-pay

## 2019-12-03 ENCOUNTER — Ambulatory Visit
Admission: RE | Admit: 2019-12-03 | Discharge: 2019-12-03 | Disposition: A | Discharge status: Home / Self Care | Payer: PPO | Source: Ambulatory Visit | Attending: Oncology | Admitting: Oncology

## 2019-12-03 DIAGNOSIS — C50412 Malignant neoplasm of upper-outer quadrant of left female breast: Secondary | ICD-10-CM

## 2019-12-03 DIAGNOSIS — R928 Other abnormal and inconclusive findings on diagnostic imaging of breast: Secondary | ICD-10-CM | POA: Diagnosis not present

## 2019-12-03 DIAGNOSIS — D369 Benign neoplasm, unspecified site: Secondary | ICD-10-CM

## 2019-12-03 DIAGNOSIS — Z170 Estrogen receptor positive status [ER+]: Secondary | ICD-10-CM

## 2019-12-03 DIAGNOSIS — D0512 Intraductal carcinoma in situ of left breast: Secondary | ICD-10-CM

## 2019-12-03 DIAGNOSIS — Z853 Personal history of malignant neoplasm of breast: Secondary | ICD-10-CM | POA: Diagnosis not present

## 2020-04-28 DIAGNOSIS — L570 Actinic keratosis: Secondary | ICD-10-CM | POA: Diagnosis not present

## 2020-04-28 DIAGNOSIS — D225 Melanocytic nevi of trunk: Secondary | ICD-10-CM | POA: Diagnosis not present

## 2020-04-28 DIAGNOSIS — D0461 Carcinoma in situ of skin of right upper limb, including shoulder: Secondary | ICD-10-CM | POA: Diagnosis not present

## 2020-04-28 DIAGNOSIS — D1801 Hemangioma of skin and subcutaneous tissue: Secondary | ICD-10-CM | POA: Diagnosis not present

## 2020-04-28 DIAGNOSIS — D485 Neoplasm of uncertain behavior of skin: Secondary | ICD-10-CM | POA: Diagnosis not present

## 2020-04-28 DIAGNOSIS — L821 Other seborrheic keratosis: Secondary | ICD-10-CM | POA: Diagnosis not present

## 2020-05-26 ENCOUNTER — Telehealth: Payer: Self-pay

## 2020-05-26 NOTE — Telephone Encounter (Signed)
Pt called stating she would like to cancel her appt she has scheduled w/Dr Magrinat for 10/19, as she did not have MRI done. Per Dr Magrinat's note in Feb 2021, pt was to have MRI this month then f/u with him as she is at high risk for BRCA. Pt states she feels MRI is not necessary as her mammo was clear and would like to cancel appt with Dr Jana Hakim at this time as she feels this appt is also not necessary.  This nurse educated pt on importance of MRI for pts who are at high risk and the benefits; however, she still wants to cancel and states she will reach back out to Dr Jana Hakim with any further concerns. Dr Jana Hakim is aware of this.

## 2020-05-31 ENCOUNTER — Inpatient Hospital Stay: Payer: PPO | Admitting: Oncology

## 2020-06-03 DIAGNOSIS — D0512 Intraductal carcinoma in situ of left breast: Secondary | ICD-10-CM | POA: Diagnosis not present

## 2020-06-03 DIAGNOSIS — N644 Mastodynia: Secondary | ICD-10-CM | POA: Diagnosis not present

## 2020-06-13 ENCOUNTER — Other Ambulatory Visit: Payer: Self-pay | Admitting: Family Medicine

## 2020-06-13 DIAGNOSIS — N644 Mastodynia: Secondary | ICD-10-CM

## 2020-06-27 ENCOUNTER — Other Ambulatory Visit: Payer: Self-pay | Admitting: Family Medicine

## 2020-06-27 DIAGNOSIS — N644 Mastodynia: Secondary | ICD-10-CM

## 2020-07-03 ENCOUNTER — Ambulatory Visit
Admission: RE | Admit: 2020-07-03 | Discharge: 2020-07-03 | Disposition: A | Discharge status: Home / Self Care | Payer: PPO | Source: Ambulatory Visit | Attending: Family Medicine | Admitting: Family Medicine

## 2020-07-03 DIAGNOSIS — N6341 Unspecified lump in right breast, subareolar: Secondary | ICD-10-CM | POA: Diagnosis not present

## 2020-07-03 DIAGNOSIS — N6041 Mammary duct ectasia of right breast: Secondary | ICD-10-CM | POA: Diagnosis not present

## 2020-07-03 DIAGNOSIS — N6313 Unspecified lump in the right breast, lower outer quadrant: Secondary | ICD-10-CM | POA: Diagnosis not present

## 2020-07-03 DIAGNOSIS — N6342 Unspecified lump in left breast, subareolar: Secondary | ICD-10-CM | POA: Diagnosis not present

## 2020-07-03 DIAGNOSIS — N644 Mastodynia: Secondary | ICD-10-CM

## 2020-07-03 MED ORDER — GADOBUTROL 1 MMOL/ML IV SOLN
6.0000 mL | Freq: Once | INTRAVENOUS | Status: AC | PRN
  Administered 2020-07-03: 6 mL via INTRAVENOUS

## 2020-07-06 DIAGNOSIS — R0789 Other chest pain: Secondary | ICD-10-CM | POA: Diagnosis not present

## 2020-07-06 DIAGNOSIS — D0512 Intraductal carcinoma in situ of left breast: Secondary | ICD-10-CM | POA: Diagnosis not present

## 2020-07-10 NOTE — Progress Notes (Addendum)
[facility]  Telephone:([phone] Fax:([phone]    ID: [name] DOB: [birth year]  MR#: [id number]  OZY#:[id number]  Patient Care Team: [name], MD as PCP - General (Family Medicine) Meric Joye, Virgie Dad, MD as Consulting Physician (Oncology) Coralie Keens, MD as Consulting Physician (General Surgery) Kyung Rudd, MD as Consulting Physician (Radiation Oncology) Alden Hipp, MD as Consulting Physician (Obstetrics and Gynecology) Clarene Essex, MD as Consulting Physician (Gastroenterology) Cristine Polio, MD as Consulting Physician (Plastic Surgery) OTHER MD:   CHIEF COMPLAINT: estrogen receptor positive DCIS; diffuse papillomatosis  CURRENT TREATMENT: Observation  I connected with [name] on 07/11/2020 at  1:30 PM EST by telephone and verified that I am speaking with the correct person using two identifiers.  I discussed the limitations, risks, security and privacy concerns of performing an evaluation and management service by telephone and the availability of in person appointments.  I also discussed with the patient that there may be a patient responsible charge related to this service. The patient expressed understanding and agreed to proceed.    INTERVAL HISTORY: "[name]" returns today for follow-up of her diffuse papillomatosis and prior history of ductal carcinoma in situ. She continues under observation.  Since her last visit, she underwent bilateral diagnostic mammography with tomography at [facility] on 12/03/2019 showing: breast density category B; no evidence of malignancy in either breast.  She also underwent breast MRI on 07/03/2020 showing: breast composition A; enhancing masses in bilateral breast not significantly changed when compared to prior in 05/2019.  At that time (October 2020) ultrasound found correlates amenable to ultrasound-guided biopsy.  The patient opted then to continue observation  She is  scheduled for second-look left breast ultrasound guided biopsies on 07/29/2020.  The pathology shows bilateral papillomas then she could have those excised versus continuing follow-up.   REVIEW OF SYSTEMS: [name] is very concerned about the findings in her breast.  We discussed them at length today by phone.  Otherwise she is doing well and looking forward to the holidays.   COVID 19 VACCINATION STATUS: fully vaccinated Therapist, music)   HISTORY OF CURRENT ILLNESS: From the original intake note:  [name] had routine mammography at [facility] under Dr. Alden Hipp on 08/17/2017 that found a possible abnormality in the left breast. She underwent unilateral left diagnostic mammography with tomography at [facility] on 08/26/2017 showing: breast density category B. Grouped calcification in the upper outer quadrant of the left breast at middle depth are suspicious for malignancy.  Accordingly on 08/27/2017 she proceeded to biopsy of the left breast area in question. The pathology from this procedure showed ([id number]): Ductal carcinoma in situ with calcifications. Prognostic indicators significant for: estrogen receptor, 95% positive and progesterone receptor, 90% positive, both with strong staining intensity.   The patient's subsequent history is as detailed below.   PAST MEDICAL HISTORY: Past Medical History:  Diagnosis Date  . Abnormal liver function test   . Arthritis    knees, hands & hips, back   . Breast cancer ([place]) 2019   Left Breast Cancer  . Cancer ([place])    DCIS- L breast  . Complication of anesthesia   . Depression    post partum, also related to her in her 20's  . Dyslipidemia   . Esophageal spasm    rare occurence , /w ? reflux, no treatment or med. thus far  . Headache    tension , sporadic   . Hypertension   . Hypertriglyceridemia   .  IBS (irritable bowel syndrome)   . PONV (postoperative nausea and vomiting)   . Sleep disturbance   Yeast  allergy, HTN   PAST SURGICAL HISTORY: Past Surgical History:  Procedure Laterality Date  . BREAST LUMPECTOMY Left 09/2017  . BREAST LUMPECTOMY WITH RADIOACTIVE SEED LOCALIZATION Left 09/25/2017   Procedure: LEFT BREAST PARTIAL MASTECTOMY WITH RADIOACTIVE SEED LOCALIZATION ERAS PATHWAY;  Surgeon: Coralie Keens, MD;  Location: [facility];  Service: General;  Laterality: Left;  . BREAST REDUCTION SURGERY    . CARDIOVASCULAR STRESS TEST    . CESAREAN SECTION  1984  . KNEE ARTHROSCOPY Right 2004  . REDUCTION MAMMAPLASTY Bilateral 1997  . ROOT CANAL    . SHOULDER SURGERY Right    2012- arthroscopy  . TONSILLECTOMY    C-section 1984. Right shoulder and right knee surgery.   FAMILY HISTORY Family History  Problem Relation Age of Onset  . Cancer Mother   . Breast cancer Mother 44  . Cancer Father   The patient's mother died at age 37 due to breast cancer, diagnosed in her 38s. The patient's father died at age 43 due to colon cancer. The patient has 1 brother and no sisters. She denies a family history of other breast, ovarian, prostate, or colon cancers.    GYNECOLOGIC HISTORY:  No LMP recorded. Patient is postmenopausal. 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 [name], her husband, is retired from being a Dispensing optician in the Therapist, music. The patient's son, [name] "[address] [place], is a Geophysicist/field seismologist in [place], [place]. The patient has no grandchildren. She belongs to the [employer].    ADVANCED DIRECTIVES:    HEALTH MAINTENANCE: Social History   Tobacco Use  . Smoking status: Never Smoker  . Smokeless tobacco: Never Used  Substance Use Topics  . Alcohol use: No  . Drug use: No     Colonoscopy: within 2 years  PAP:  2017  Bone density: more than 2 years ago: osteopenia (2009) under Dr. Theadore Nan    Allergies  Allergen Reactions  . Yeast Other (See Comments)    Abdominal pain  . Fenofibrate Other (See Comments)    Unknown  . Glycerine [Glycerin] Nausea And Vomiting  . Keflex [Cephalexin] Nausea And Vomiting  . Tetracyclines & Related Nausea And Vomiting    Current Outpatient Medications  Medication Sig Dispense Refill  . acetaminophen (TYLENOL) 325 MG tablet Take 650 mg by mouth every 6 (six) hours as needed for moderate pain or headache.    Marland Kitchen aspirin-acetaminophen-caffeine (EXCEDRIN MIGRAINE) 250-250-65 MG tablet Take 2 tablets by mouth every 6 (six) hours as needed for headache.    . diclofenac sodium (VOLTAREN) 1 % GEL Apply 2 g topically 4 (four) times daily.    . Glucosamine-Chondroitin-MSM 500-400-125 MG TABS Take 2 tablets by mouth daily.    . irbesartan-hydrochlorothiazide (AVALIDE) 150-12.5 MG tablet Take 1 tablet by mouth daily.    . Multiple Vitamin (MULTIVITAMIN WITH MINERALS) TABS tablet Take 1 tablet by mouth daily.     . polyethylene glycol (MIRALAX / GLYCOLAX) packet Take 17 g by mouth daily as needed for moderate constipation.     . Probiotic Product (PROBIOTIC DAILY PO) Take 1 tablet by mouth as needed (  for health).     . Simethicone (PHAZYME) 180 MG CAPS Take 180-360 mg by mouth daily as needed (for gas).    . zaleplon (SONATA) 10 MG capsule Take 10 mg by mouth at bedtime as needed for sleep.     No current facility-administered medications for this visit.    OBJECTIVE:   There were no vitals filed for this visit.   There is no height or weight on file to calculate BMI.   Wt Readings from Last 3 Encounters:  09/21/19 135 lb 9.6 oz (61.5 kg)  01/27/19 134 lb 14.7 oz (61.2 kg)  09/12/18 132 lb 4.8 oz (60 kg)      ECOG FS:0 - Asymptomatic   Televisit   LAB RESULTS:  CMP     Component Value Date/Time   NA 140 01/30/2019 0816   K 2.7 (LL) 01/30/2019 0816   CL  106 01/30/2019 0816   CO2 26 01/30/2019 0816   GLUCOSE 127 (H) 01/30/2019 0816   BUN <5 (L) 01/30/2019 0816   CREATININE 0.81 01/30/2019 0816   CREATININE 0.86 09/23/2017 1539   CALCIUM 7.9 (L) 01/30/2019 0816   PROT 5.6 (L) 01/30/2019 0816   ALBUMIN 2.8 (L) 01/30/2019 0816   AST 25 01/30/2019 0816   AST 25 09/23/2017 1539   ALT 24 01/30/2019 0816   ALT 36 09/23/2017 1539   ALKPHOS 79 01/30/2019 0816   BILITOT 0.9 01/30/2019 0816   BILITOT 0.5 09/23/2017 1539   GFRNONAA >60 01/30/2019 0816   GFRNONAA >60 09/23/2017 1539   GFRAA >60 01/30/2019 0816   GFRAA >60 09/23/2017 1539    No results found for: TOTALPROTELP, ALBUMINELP, A1GS, A2GS, BETS, BETA2SER, GAMS, MSPIKE, SPEI  No results found for: KPAFRELGTCHN, LAMBDASER, KAPLAMBRATIO  Lab Results  Component Value Date   WBC 8.2 01/29/2019   NEUTROABS 3.1 09/23/2017   HGB 12.0 01/29/2019   HCT 34.8 (L) 01/29/2019   MCV 90.6 01/29/2019   PLT 214 01/29/2019   No results found for: LABCA2  No components found for: FSELTR320  No results for input(s): INR in the last 168 hours.  No results found for: LABCA2  No results found for: EBX435  No results found for: WYS168  No results found for: HFG902  No results found for: CA2729  No components found for: HGQUANT  No results found for: CEA1 / No results found for: CEA1   No results found for: AFPTUMOR  No results found for: CHROMOGRNA  No results found for: HGBA, HGBA2QUANT, HGBFQUANT, HGBSQUAN (Hemoglobinopathy evaluation)   No results found for: LDH  No results found for: IRON, TIBC, IRONPCTSAT (Iron and TIBC)  No results found for: FERRITIN  Urinalysis    Component Value Date/Time   COLORURINE YELLOW 01/26/2019 0034   APPEARANCEUR CLEAR 01/26/2019 0034   LABSPEC >1.046 (H) 01/26/2019 0034   PHURINE 6.0 01/26/2019 0034   GLUCOSEU NEGATIVE 01/26/2019 0034   HGBUR NEGATIVE 01/26/2019 0034   BILIRUBINUR NEGATIVE 01/26/2019 0034   KETONESUR 20 (A)  01/26/2019 0034   PROTEINUR NEGATIVE 01/26/2019 0034   NITRITE NEGATIVE 01/26/2019 0034   LEUKOCYTESUR NEGATIVE 01/26/2019 0034    STUDIES: MR BREAST BILATERAL W WO CONTRAST INC CAD  Result Date: 07/04/2020 CLINICAL DATA:  69 year old female with history of left lumpectomy for DCIS in February 2019. The patient had a subsequent biopsy in the right breast in January 2020 demonstrating a papilloma, which she elected not to have surgically excised. The patient had a follow-up MRI in October 2020 which  demonstrated multiple intraductal enhancing masses in the bilateral breasts. Ultrasound correlates were identified with these areas, and biopsies recommended. However, the patient has elected not to perform additional biopsies. She presents today for repeat MRI evaluation. LABS:  None performed on site. EXAM: BILATERAL BREAST MRI WITH AND WITHOUT CONTRAST TECHNIQUE: Multiplanar, multisequence MR images of both breasts were obtained prior to and following the intravenous administration of 6 ml of Gadavist. Three-dimensional MR images were rendered by post-processing of the original MR data on an independent workstation. The three-dimensional MR images were interpreted, and findings are reported in the following complete MRI report for this study. Three dimensional images were evaluated at the independent interpreting workstation using the DynaCAD thin client. COMPARISON:  Previous exam(s). FINDINGS: Breast composition: a. Almost entirely fat. Background parenchymal enhancement: Mild. Right breast: Susceptibility artifact from post biopsy clip is again seen in the upper outer aspect of the right breast at anterior depth. This is again seen in association with a dilated duct and associated masslike and linear enhancement correlating with the patient's site of biopsied papilloma (series 6, image 91/144). The overall appearance of this is stable when compared to prior MRI dated 04/19/2019. An additional enhancing  mass is seen in the upper inner subareolar right breast (series 6, image 90/144). This mass measures up to 5 mm in greatest dimension, similar to prior study. An additional mass in the lower outer quadrant of the right breast at anterior depth (series 6, image 105/144) is less prominent on today's study. No additional suspicious enhancing masses or non mass enhancement is seen in the remainder of the right breast. Left breast: Postsurgical changes from prior lumpectomy again noted in the superior central aspect from mid to posterior depth. There is redemonstration of 3 enhancing masses linearly oriented within a dilated duct in the subareolar left breast (series 6, images 78, 80, 82/144). The overall appearance is not significantly changed from study dated 05/19/2019. No additional suspicious enhancing masses or non mass is tense mint is seen in the remainder of the left breast. Lymph nodes: No abnormal appearing lymph nodes. Ancillary findings:  None. IMPRESSION: Enhancing masses in the bilateral breast not significantly changed in size and morphology when compared to prior MRI study dated 05/19/2019. Ultrasound correlates were identified on second-look evaluation with biopsy recommended. RECOMMENDATION: Recommendation stands for ultrasound-guided biopsy of the sonographically identified, correlating masses. If pathology demonstrates bilateral papillomas, options for excision versus imaging follow-up can be discussed with the patient and her surgeon. BI-RADS CATEGORY  4: Suspicious. Electronically Signed   By: Kristopher Oppenheim M.D.   On: 07/04/2020 14:31     ELIGIBLE FOR AVAILABLE RESEARCH PROTOCOL: Decided against the COMET trial   ASSESSMENT: 69 y.o. [place], [place] Woman  (1) s/p left breast upper outer quadrant biopsy 08/27/2017 for ductal carcinoma in situ, grade 2, estrogen and progesterone receptor positive  (2) left lumpectomy with no sentinel lymph node sampling 09/25/2017 confirmed ductal  carcinoma in situ, grade 2, measuring 2.1 cm, with negative margins.  (3) patient decided against adjuvant radiation.  (4) patient decided against adjuvant antiestrogens  (5) diffuse papillomatosis   (a) right breast biopsy 09/05/2018 shows an intraductal papilloma   (b) breast MRI 05/19/2019 shows multiple bilateral ducts containing enhancing intraductal masses  (c) patient decided against surgery   (d) decided against antiestrogens  (6) intensified screening:  (a) mammography with tomography [facility] April  (b) breast MRI every October   PLAN: [name] is close to 3 years out from definitive surgery for  her earlier breast cancer.  She has several findings in both breasts of concern.  These really are not that different from last year but I would say at least 2 of them need to be biopsied, namely the linear reading in the right breast and the 0.5 cm mass in the left breast.  The area of non-mass-like enhancement in the left breast also probably should be biopsied.  We discussed this extensively today and I encouraged her to go ahead and get the biopsies done.  We can then discuss the results with the actual information on hand  Tentatively I am making her a return appointment with me mid January but of course we can discuss the biopsy results as soon as they come in  The patient was provided an opportunity to ask questions and all were answered. The patient agreed with the plan and demonstrated an understanding of the instructions.   The patient was advised to call back or seek an in-person evaluation if the symptoms worsen or if the condition fails to improve as anticipated.   I provided 12 minutes of non face-to-face telephone visit time during this encounter, and > 50% was spent counseling as documented under my assessment & plan.   Varonica Siharath, Virgie Dad, MD  07/11/20 8:02 AM Medical Oncology and Hematology [facility] [place], [place] [address] Tel.  [phone]    Fax. [phone]   I, Wilburn Mylar, am acting as scribe for Dr. Virgie Dad. Taysha Majewski.  I, Lurline Del MD, have reviewed the above documentation for accuracy and completeness, and I agree with the above.   *Total Encounter Time as defined by the Centers for Medicare and Medicaid Services includes, in addition to the face-to-face time of a patient visit (documented in the note above) non-face-to-face time: obtaining and reviewing outside history, ordering and reviewing medications, tests or procedures, care coordination (communications with other health care professionals or caregivers) and documentation in the medical record.

## 2020-07-11 ENCOUNTER — Inpatient Hospital Stay: Payer: PPO | Attending: Oncology | Admitting: Oncology

## 2020-07-11 ENCOUNTER — Encounter: Payer: Self-pay | Admitting: Oncology

## 2020-07-11 ENCOUNTER — Other Ambulatory Visit: Payer: Self-pay | Admitting: Oncology

## 2020-07-11 DIAGNOSIS — D0512 Intraductal carcinoma in situ of left breast: Secondary | ICD-10-CM | POA: Diagnosis not present

## 2020-07-11 DIAGNOSIS — N6310 Unspecified lump in the right breast, unspecified quadrant: Secondary | ICD-10-CM

## 2020-07-11 DIAGNOSIS — R9389 Abnormal findings on diagnostic imaging of other specified body structures: Secondary | ICD-10-CM

## 2020-07-11 DIAGNOSIS — C50412 Malignant neoplasm of upper-outer quadrant of left female breast: Secondary | ICD-10-CM | POA: Diagnosis not present

## 2020-07-11 DIAGNOSIS — N6320 Unspecified lump in the left breast, unspecified quadrant: Secondary | ICD-10-CM

## 2020-07-11 DIAGNOSIS — Z170 Estrogen receptor positive status [ER+]: Secondary | ICD-10-CM | POA: Diagnosis not present

## 2020-07-14 DIAGNOSIS — M5136 Other intervertebral disc degeneration, lumbar region: Secondary | ICD-10-CM | POA: Diagnosis not present

## 2020-07-14 DIAGNOSIS — M9904 Segmental and somatic dysfunction of sacral region: Secondary | ICD-10-CM | POA: Diagnosis not present

## 2020-07-14 DIAGNOSIS — M9905 Segmental and somatic dysfunction of pelvic region: Secondary | ICD-10-CM | POA: Diagnosis not present

## 2020-07-14 DIAGNOSIS — M9903 Segmental and somatic dysfunction of lumbar region: Secondary | ICD-10-CM | POA: Diagnosis not present

## 2020-07-14 NOTE — Addendum Note (Signed)
Addended by: Scot Dock on: 07/14/2020 12:15 PM   Modules accepted: Level of Service

## 2020-07-15 ENCOUNTER — Encounter: Payer: Self-pay | Admitting: Oncology

## 2020-07-18 DIAGNOSIS — H43393 Other vitreous opacities, bilateral: Secondary | ICD-10-CM | POA: Diagnosis not present

## 2020-07-20 ENCOUNTER — Other Ambulatory Visit: Payer: PPO

## 2020-07-25 ENCOUNTER — Ambulatory Visit
Admission: RE | Admit: 2020-07-25 | Discharge: 2020-07-25 | Disposition: A | Discharge status: Home / Self Care | Payer: PPO | Source: Ambulatory Visit | Attending: Oncology | Admitting: Oncology

## 2020-07-25 ENCOUNTER — Other Ambulatory Visit: Payer: Self-pay | Admitting: Oncology

## 2020-07-25 ENCOUNTER — Other Ambulatory Visit: Payer: Self-pay

## 2020-07-25 DIAGNOSIS — N6320 Unspecified lump in the left breast, unspecified quadrant: Secondary | ICD-10-CM

## 2020-07-25 DIAGNOSIS — N6310 Unspecified lump in the right breast, unspecified quadrant: Secondary | ICD-10-CM

## 2020-07-25 DIAGNOSIS — C50412 Malignant neoplasm of upper-outer quadrant of left female breast: Secondary | ICD-10-CM

## 2020-07-25 DIAGNOSIS — R9389 Abnormal findings on diagnostic imaging of other specified body structures: Secondary | ICD-10-CM

## 2020-07-25 DIAGNOSIS — D242 Benign neoplasm of left breast: Secondary | ICD-10-CM | POA: Diagnosis not present

## 2020-07-25 DIAGNOSIS — Z170 Estrogen receptor positive status [ER+]: Secondary | ICD-10-CM

## 2020-07-25 DIAGNOSIS — N6312 Unspecified lump in the right breast, upper inner quadrant: Secondary | ICD-10-CM | POA: Diagnosis not present

## 2020-07-25 DIAGNOSIS — D241 Benign neoplasm of right breast: Secondary | ICD-10-CM | POA: Diagnosis not present

## 2020-07-25 DIAGNOSIS — N6321 Unspecified lump in the left breast, upper outer quadrant: Secondary | ICD-10-CM | POA: Diagnosis not present

## 2020-07-25 DIAGNOSIS — N6313 Unspecified lump in the right breast, lower outer quadrant: Secondary | ICD-10-CM | POA: Diagnosis not present

## 2020-07-25 HISTORY — PX: BREAST BIOPSY: SHX20

## 2020-07-28 DIAGNOSIS — M5136 Other intervertebral disc degeneration, lumbar region: Secondary | ICD-10-CM | POA: Diagnosis not present

## 2020-07-28 DIAGNOSIS — M9905 Segmental and somatic dysfunction of pelvic region: Secondary | ICD-10-CM | POA: Diagnosis not present

## 2020-07-28 DIAGNOSIS — M9903 Segmental and somatic dysfunction of lumbar region: Secondary | ICD-10-CM | POA: Diagnosis not present

## 2020-07-28 DIAGNOSIS — M9904 Segmental and somatic dysfunction of sacral region: Secondary | ICD-10-CM | POA: Diagnosis not present

## 2020-07-29 ENCOUNTER — Other Ambulatory Visit: Payer: PPO

## 2020-07-31 NOTE — Progress Notes (Signed)
[place]  [phone] Fax:([phone]    ID: [name] DOB: 1951/03/02  MR#: [id number]  KKX#:[id number]  Patient Care Team: [name], MD as PCP - General (Family Medicine) Magaly Pollina, Virgie Dad, MD as Consulting Physician (Oncology) Coralie Keens, MD as Consulting Physician (General Surgery) Kyung Rudd, MD as Consulting Physician (Radiation Oncology) Alden Hipp, MD as Consulting Physician (Obstetrics and Gynecology) Clarene Essex, MD as Consulting Physician (Gastroenterology) Cristine Polio, MD as Consulting Physician (Plastic Surgery) OTHER MD:   CHIEF COMPLAINT: estrogen receptor positive DCIS; diffuse papillomatosis  CURRENT TREATMENT: Observation   INTERVAL HISTORY: "[name]" returns today for follow-up of her diffuse papillomatosis and prior history of ductal carcinoma in situ. She continues under observation.  Since her last visit, she underwent second-look bilateral breast ultrasonography on 07/25/2020 following abnormal findings on recent breast MRI. This showed: 8 mm solid and cystic right breast mass at 8:30; 7 mm lobular hyperechoic right breast mass at 1 o'clock; 1.3 cm solid and cystic left breast mass at 1 o'clock; 7 mm hypoechoic mass most compatible with oil cyst in left breast at 2 o'clock.  She proceeded to biopsies of the breast areas in question on 07/25/2020. Pathology from the procedure [id number]) showed intraductal papilloma to all three sites.   REVIEW OF SYSTEMS: [name] has some pain in the breasts from the recent biopsies, but no other changes of concern.  She has had a little bit of a cough and some sniffles but no fever no loss of sense of smell, no shortness of breath, and no malaise.  Detailed review of systems today was otherwise stable   COVID 19 VACCINATION STATUS: fully vaccinated [employer]) with booster October 2021   HISTORY OF CURRENT ILLNESS: From the original intake  note:  [name] had routine mammography at [facility] under Dr. Alden Hipp on 08/17/2017 that found a possible abnormality in the left breast. She underwent unilateral left diagnostic mammography with tomography at [facility] on 08/26/2017 showing: breast density category B. Grouped calcification in the upper outer quadrant of the left breast at middle depth are suspicious for malignancy.  Accordingly on 08/27/2017 she proceeded to biopsy of the left breast area in question. The pathology from this procedure showed ([id number]): Ductal carcinoma in situ with calcifications. Prognostic indicators significant for: estrogen receptor, 95% positive and progesterone receptor, 90% positive, both with strong staining intensity.   The patient's subsequent history is as detailed below.   PAST MEDICAL HISTORY: Past Medical History:  Diagnosis Date  . Abnormal liver function test   . Arthritis    knees, hands & hips, back   . Breast cancer ([place]) 2019   Left Breast Cancer  . Cancer ([place])    DCIS- L breast  . Complication of anesthesia   . Depression    post partum, also related to her in her 27's  . Dyslipidemia   . Esophageal spasm    rare occurence , /w ? reflux, no treatment or med. thus far  . Headache    tension , sporadic   . Hypertension   . Hypertriglyceridemia   . IBS (irritable bowel syndrome)   . PONV (postoperative nausea and vomiting)   . Sleep disturbance   Yeast allergy, HTN   PAST SURGICAL HISTORY: Past Surgical History:  Procedure Laterality Date  . BREAST LUMPECTOMY Left 09/2017  . BREAST LUMPECTOMY WITH RADIOACTIVE SEED LOCALIZATION Left 09/25/2017   Procedure: LEFT BREAST PARTIAL MASTECTOMY WITH RADIOACTIVE SEED LOCALIZATION ERAS PATHWAY;  Surgeon: Abigail Miyamoto, MD;  Location: [facility] OR;  Service: General;  Laterality: Left;  . BREAST REDUCTION SURGERY    . CARDIOVASCULAR STRESS TEST    . CESAREAN SECTION  1984  . KNEE ARTHROSCOPY  Right 2004  . REDUCTION MAMMAPLASTY Bilateral 1997  . ROOT CANAL    . SHOULDER SURGERY Right    2012- arthroscopy  . TONSILLECTOMY    C-section 1984. Right shoulder and right knee surgery.   FAMILY HISTORY Family History  Problem Relation Age of Onset  . Cancer Mother   . Breast cancer Mother 16  . Cancer Father   The patient's mother died at age 69 due to breast cancer, diagnosed in her 95s. The patient's father died at age 32 due to colon cancer. The patient has 1 brother and no sisters. She denies a family history of other breast, ovarian, prostate, or colon cancers.    GYNECOLOGIC HISTORY:  No LMP recorded. Patient is postmenopausal. 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 Carney Bern, her husband, is retired from being a Artist in the Equities trader. The patient's son, [name] "[address] [place], is a Environmental manager in [place], [place]. The patient has no grandchildren. She belongs to the [employer].    ADVANCED DIRECTIVES: In the absence of any documentation to the contrary, the patient's spouse is their HCPOA.    HEALTH MAINTENANCE: Social History   Tobacco Use  . Smoking status: Never Smoker  . Smokeless tobacco: Never Used  Substance Use Topics  . Alcohol use: No  . Drug use: No     Colonoscopy: within 2 years  PAP: 2017  Bone density: more than 2 years ago: osteopenia (2009) under Dr. Selena Batten    Allergies  Allergen Reactions  . Yeast Other (See Comments)    Abdominal pain  . Fenofibrate Other (See Comments)    Unknown  . Glycerine [Glycerin] Nausea And Vomiting  . Keflex [Cephalexin] Nausea And Vomiting  . Tetracyclines & Related Nausea And Vomiting    Current Outpatient Medications  Medication  Sig Dispense Refill  . acetaminophen (TYLENOL) 325 MG tablet Take 650 mg by mouth every 6 (six) hours as needed for moderate pain or headache.    Marland Kitchen aspirin-acetaminophen-caffeine (EXCEDRIN MIGRAINE) 250-250-65 MG tablet Take 2 tablets by mouth every 6 (six) hours as needed for headache.    . diclofenac sodium (VOLTAREN) 1 % GEL Apply 2 g topically 4 (four) times daily.    . Glucosamine-Chondroitin-MSM 500-400-125 MG TABS Take 2 tablets by mouth daily.    . irbesartan-hydrochlorothiazide (AVALIDE) 150-12.5 MG tablet Take 1 tablet by mouth daily.    . Multiple Vitamin (MULTIVITAMIN WITH MINERALS) TABS tablet Take 1 tablet by mouth daily.     . polyethylene glycol (MIRALAX / GLYCOLAX) packet Take 17 g by mouth daily as needed for moderate constipation.     . Probiotic Product (PROBIOTIC DAILY PO) Take 1 tablet by mouth as needed (for health).     . Simethicone (PHAZYME) 180 MG CAPS Take 180-360 mg by mouth daily as needed (for gas).    . zaleplon (SONATA) 10 MG capsule Take 10 mg by mouth at bedtime as needed for sleep.     No current facility-administered  medications for this visit.    OBJECTIVE: White woman in no acute distress  Vitals:   08/01/20 1131  BP: (!) 150/84  Pulse: (!) 107  Resp: 18  Temp: (!) 97.5 F (36.4 C)  SpO2: 97%     Body mass index is 26.37 kg/m.   Wt Readings from Last 3 Encounters:  08/01/20 135 lb (61.2 kg)  09/21/19 135 lb 9.6 oz (61.5 kg)  01/27/19 134 lb 14.7 oz (61.2 kg)      ECOG FS:0 - Asymptomatic  Sclerae unicteric, EOMs intact Wearing a mask No cervical or supraclavicular adenopathy Lungs no rales or rhonchi Heart regular rate and rhythm Abd soft, nontender, positive bowel sounds MSK no focal spinal tenderness, no upper extremity lymphedema Neuro: nonfocal, well oriented, appropriate affect Breasts: Both breasts are status post recent biopsies.  There are significant ecchymoses bilaterally.  Both breasts are also status post reduction  mammoplasties.    LAB RESULTS:  CMP     Component Value Date/Time   NA 140 01/30/2019 0816   K 2.7 (LL) 01/30/2019 0816   CL 106 01/30/2019 0816   CO2 26 01/30/2019 0816   GLUCOSE 127 (H) 01/30/2019 0816   BUN <5 (L) 01/30/2019 0816   CREATININE 0.81 01/30/2019 0816   CREATININE 0.86 09/23/2017 1539   CALCIUM 7.9 (L) 01/30/2019 0816   PROT 5.6 (L) 01/30/2019 0816   ALBUMIN 2.8 (L) 01/30/2019 0816   AST 25 01/30/2019 0816   AST 25 09/23/2017 1539   ALT 24 01/30/2019 0816   ALT 36 09/23/2017 1539   ALKPHOS 79 01/30/2019 0816   BILITOT 0.9 01/30/2019 0816   BILITOT 0.5 09/23/2017 1539   GFRNONAA >60 01/30/2019 0816   GFRNONAA >60 09/23/2017 1539   GFRAA >60 01/30/2019 0816   GFRAA >60 09/23/2017 1539    No results found for: TOTALPROTELP, ALBUMINELP, A1GS, A2GS, BETS, BETA2SER, GAMS, MSPIKE, SPEI  No results found for: KPAFRELGTCHN, LAMBDASER, KAPLAMBRATIO  Lab Results  Component Value Date   WBC 8.2 01/29/2019   NEUTROABS 3.1 09/23/2017   HGB 12.0 01/29/2019   HCT 34.8 (L) 01/29/2019   MCV 90.6 01/29/2019   PLT 214 01/29/2019   No results found for: LABCA2  No components found for: DGLOVF643  No results for input(s): INR in the last 168 hours.  No results found for: LABCA2  No results found for: PIR518  No results found for: ACZ660  No results found for: YTK160  No results found for: CA2729  No components found for: HGQUANT  No results found for: CEA1 / No results found for: CEA1   No results found for: AFPTUMOR  No results found for: CHROMOGRNA  No results found for: HGBA, HGBA2QUANT, HGBFQUANT, HGBSQUAN (Hemoglobinopathy evaluation)   No results found for: LDH  No results found for: IRON, TIBC, IRONPCTSAT (Iron and TIBC)  No results found for: FERRITIN  Urinalysis    Component Value Date/Time   COLORURINE YELLOW 01/26/2019 0034   APPEARANCEUR CLEAR 01/26/2019 0034   LABSPEC >1.046 (H) 01/26/2019 0034   PHURINE 6.0 01/26/2019 0034    GLUCOSEU NEGATIVE 01/26/2019 0034   HGBUR NEGATIVE 01/26/2019 0034   BILIRUBINUR NEGATIVE 01/26/2019 0034   KETONESUR 20 (A) 01/26/2019 0034   PROTEINUR NEGATIVE 01/26/2019 0034   NITRITE NEGATIVE 01/26/2019 0034   LEUKOCYTESUR NEGATIVE 01/26/2019 0034    STUDIES: MR BREAST BILATERAL W WO CONTRAST INC CAD  Result Date: 07/04/2020 CLINICAL DATA:  69 year old female with history of left lumpectomy for DCIS in February 2019.  The patient had a subsequent biopsy in the right breast in January 2020 demonstrating a papilloma, which she elected not to have surgically excised. The patient had a follow-up MRI in October 2020 which demonstrated multiple intraductal enhancing masses in the bilateral breasts. Ultrasound correlates were identified with these areas, and biopsies recommended. However, the patient has elected not to perform additional biopsies. She presents today for repeat MRI evaluation. LABS:  None performed on site. EXAM: BILATERAL BREAST MRI WITH AND WITHOUT CONTRAST TECHNIQUE: Multiplanar, multisequence MR images of both breasts were obtained prior to and following the intravenous administration of 6 ml of Gadavist. Three-dimensional MR images were rendered by post-processing of the original MR data on an independent workstation. The three-dimensional MR images were interpreted, and findings are reported in the following complete MRI report for this study. Three dimensional images were evaluated at the independent interpreting workstation using the DynaCAD thin client. COMPARISON:  Previous exam(s). FINDINGS: Breast composition: a. Almost entirely fat. Background parenchymal enhancement: Mild. Right breast: Susceptibility artifact from post biopsy clip is again seen in the upper outer aspect of the right breast at anterior depth. This is again seen in association with a dilated duct and associated masslike and linear enhancement correlating with the patient's site of biopsied papilloma (series 6,  image 91/144). The overall appearance of this is stable when compared to prior MRI dated 04/19/2019. An additional enhancing mass is seen in the upper inner subareolar right breast (series 6, image 90/144). This mass measures up to 5 mm in greatest dimension, similar to prior study. An additional mass in the lower outer quadrant of the right breast at anterior depth (series 6, image 105/144) is less prominent on today's study. No additional suspicious enhancing masses or non mass enhancement is seen in the remainder of the right breast. Left breast: Postsurgical changes from prior lumpectomy again noted in the superior central aspect from mid to posterior depth. There is redemonstration of 3 enhancing masses linearly oriented within a dilated duct in the subareolar left breast (series 6, images 78, 80, 82/144). The overall appearance is not significantly changed from study dated 05/19/2019. No additional suspicious enhancing masses or non mass is tense mint is seen in the remainder of the left breast. Lymph nodes: No abnormal appearing lymph nodes. Ancillary findings:  None. IMPRESSION: Enhancing masses in the bilateral breast not significantly changed in size and morphology when compared to prior MRI study dated 05/19/2019. Ultrasound correlates were identified on second-look evaluation with biopsy recommended. RECOMMENDATION: Recommendation stands for ultrasound-guided biopsy of the sonographically identified, correlating masses. If pathology demonstrates bilateral papillomas, options for excision versus imaging follow-up can be discussed with the patient and her surgeon. BI-RADS CATEGORY  4: Suspicious. Electronically Signed   By: Kristopher Oppenheim M.D.   On: 07/04/2020 14:31   US BREAST LTD UNI LEFT INC AXILLA  Result Date: 07/25/2020 CLINICAL DATA:  Patient with history of left lumpectomy for DCIS 09/2017. Patient has a prior biopsy within the right breast demonstrating a papilloma which has not been excised.  Previous MRI 06/02/2019 which demonstrated multiple intraductal enhancing masses within the breast bilaterally. Patient second-look ultrasound 05/27/2019 which demonstrated probable sonographic correlate within the right and left breast. EXAM: ULTRASOUND OF THE BILATERAL BREAST COMPARISON:  Previous exam(s). FINDINGS: Targeted ultrasound is performed: Right breast: Within the 8:30 o'clock 3 cm from nipple there is a 4 x 8 x 3 mm solid and cystic mass. Within the right breast 1 o'clock position 1 cm from the nipple  there is a 4 x 3 x 7 mm lobular hypoechoic mass. Left breast: Within 1 o'clock position 1 cm from the nipple there is a 1.1 x 0.9 x 1.3 cm solid and cystic mass. The solid nodular component measures 5 mm. Within the left breast 2 o'clock position 2 cm from nipple there is a 7 x 4 x 6 mm hypoechoic mass with echogenic margin most compatible with oil cyst from postlumpectomy changes. IMPRESSION: 1. Bilateral breast masses as described above including left breast mass 1 o'clock position, right breast mass 1 o'clock position and right breast mass 8 o'clock position. These are felt to correspond with the MRI abnormalities. RECOMMENDATION: Ultrasound-guided core needle biopsy of the left breast mass 1 o'clock position, right breast mass 1 o'clock position and right breast mass 8 o'clock position. I have discussed the findings and recommendations with the patient. If applicable, a reminder letter will be sent to the patient regarding the next appointment. BI-RADS CATEGORY  4: Suspicious. Electronically Signed   By: Lovey Newcomer M.D.   On: 07/25/2020 15:54   US BREAST LTD UNI RIGHT INC AXILLA  Result Date: 07/25/2020 CLINICAL DATA:  Patient with history of left lumpectomy for DCIS 09/2017. Patient has a prior biopsy within the right breast demonstrating a papilloma which has not been excised. Previous MRI 06/02/2019 which demonstrated multiple intraductal enhancing masses within the breast bilaterally. Patient  second-look ultrasound 05/27/2019 which demonstrated probable sonographic correlate within the right and left breast. EXAM: ULTRASOUND OF THE BILATERAL BREAST COMPARISON:  Previous exam(s). FINDINGS: Targeted ultrasound is performed: Right breast: Within the 8:30 o'clock 3 cm from nipple there is a 4 x 8 x 3 mm solid and cystic mass. Within the right breast 1 o'clock position 1 cm from the nipple there is a 4 x 3 x 7 mm lobular hypoechoic mass. Left breast: Within 1 o'clock position 1 cm from the nipple there is a 1.1 x 0.9 x 1.3 cm solid and cystic mass. The solid nodular component measures 5 mm. Within the left breast 2 o'clock position 2 cm from nipple there is a 7 x 4 x 6 mm hypoechoic mass with echogenic margin most compatible with oil cyst from postlumpectomy changes. IMPRESSION: 1. Bilateral breast masses as described above including left breast mass 1 o'clock position, right breast mass 1 o'clock position and right breast mass 8 o'clock position. These are felt to correspond with the MRI abnormalities. RECOMMENDATION: Ultrasound-guided core needle biopsy of the left breast mass 1 o'clock position, right breast mass 1 o'clock position and right breast mass 8 o'clock position. I have discussed the findings and recommendations with the patient. If applicable, a reminder letter will be sent to the patient regarding the next appointment. BI-RADS CATEGORY  4: Suspicious. Electronically Signed   By: Lovey Newcomer M.D.   On: 07/25/2020 15:54   MM CLIP PLACEMENT LEFT  Result Date: 07/25/2020 CLINICAL DATA:  Patient status post ultrasound-guided core needle biopsy left breast mass 1 o'clock position. EXAM: DIAGNOSTIC LEFT MAMMOGRAM POST ULTRASOUND BIOPSY COMPARISON:  Previous exam(s). FINDINGS: Mammographic images were obtained following ultrasound guided biopsy of left breast mass 1 o'clock position. The biopsy marking clip is in expected position at the site of biopsy. IMPRESSION: Appropriate positioning of the  ribbon shaped biopsy marking clip at the site of biopsy in the left breast mass 1 o'clock position. Final Assessment: Post Procedure Mammograms for Marker Placement Electronically Signed   By: Lovey Newcomer M.D.   On: 07/25/2020 16:04  MM CLIP PLACEMENT RIGHT  Result Date: 07/25/2020 CLINICAL DATA:  Patient status post ultrasound-guided core needle biopsy right breast mass 1 o'clock position and right breast mass 8 o'clock position. EXAM: DIAGNOSTIC RIGHT MAMMOGRAM POST ULTRASOUND BIOPSY COMPARISON:  Previous exam(s). FINDINGS: Mammographic images were obtained following ultrasound guided biopsy of right breast mass 1 o'clock position and right breast mass 8 o'clock position. Site 2: Right breast mass 1 o'clock position: Coil shaped clip: In appropriate position. Site 3: Right breast mass 8 o'clock position: Heart shaped clip: In appropriate position. Ribbon shaped clip from previous biopsy within the outer right breast stable in position. IMPRESSION: Appropriate positioning of the biopsy marking clips as above. Final Assessment: Post Procedure Mammograms for Marker Placement Electronically Signed   By: Lovey Newcomer M.D.   On: 07/25/2020 16:03   [place] LT BREAST BX W LOC DEV 1ST LESION IMG BX SPEC US GUIDE  Addendum Date: 07/28/2020   ADDENDUM REPORT: 07/26/2020 12:54 ADDENDUM: Pathology revealed INTRADUCTAL PAPILLOMA of the LEFT breast, 1 o'clock. This was found to be concordant by Dr. Lovey Newcomer, with excision recommended. Pathology revealed INTRADUCTAL PAPILLOMA of the RIGHT breast, 1 o'clock. This was found to be concordant by Dr. Lovey Newcomer, with excision recommended. Pathology revealed INTRADUCTAL PAPILLOMA WITH FOCAL ATYPICAL DUCTAL HYPERPLASIA of the RIGHT breast, 8 o'clock. This was found to be concordant by Dr. Lovey Newcomer, with excision recommended. Pathology results were discussed with the patient by telephone. The patient reported doing well after the biopsies with tenderness at the sites. Post  biopsy instructions and care were reviewed and questions were answered. The patient was encouraged to call [facility] for any additional concerns. My direct phone number was provided. Surgical consultation has been arranged with Dr. Nedra Hai at [facility] on September 06, 2020 to discuss options for excision verses high risk screening/follow-up given multiple papillomas. The patient was instructed to return for a bilateral breast MRI in 6 months, due in May 2022, and annual diagnostic mammography in April 2022. Pathology results reported by Terie Purser, RN on 07/26/2020. Electronically Signed   By: Lovey Newcomer M.D.   On: 07/26/2020 12:54   Result Date: 07/28/2020 CLINICAL DATA:  Patient with indeterminate left breast mass 1 o'clock position. EXAM: ULTRASOUND GUIDED LEFT BREAST CORE NEEDLE BIOPSY COMPARISON:  Previous exam(s). PROCEDURE: I met with the patient and we discussed the procedure of ultrasound-guided biopsy, including benefits and alternatives. We discussed the high likelihood of a successful procedure. We discussed the risks of the procedure, including infection, bleeding, tissue injury, clip migration, and inadequate sampling. Informed written consent was given. The usual time-out protocol was performed immediately prior to the procedure. Lesion quadrant: Upper outer quadrant Using sterile technique and 1% Lidocaine as local anesthetic, under direct ultrasound visualization, a 14 gauge spring-loaded device was used to perform biopsy of left breast mass 1 o'clock position using a lateral approach. At the conclusion of the procedure ribbon shaped tissue marker clip was deployed into the biopsy cavity. Follow up 2 view mammogram was performed and dictated separately. IMPRESSION: Ultrasound guided biopsy of left breast mass 1 o'clock position. No apparent complications. Electronically Signed: By: Lovey Newcomer M.D. On: 07/25/2020 16:01   [place] RT BREAST BX W LOC  DEV 1ST LESION IMG BX SPEC US GUIDE  Addendum Date: 07/28/2020   ADDENDUM REPORT: 07/26/2020 12:53 ADDENDUM: Pathology revealed INTRADUCTAL PAPILLOMA of the LEFT breast, 1 o'clock. This was found to be concordant by Dr. Lovey Newcomer,  with excision recommended. Pathology revealed INTRADUCTAL PAPILLOMA of the RIGHT breast, 1 o'clock. This was found to be concordant by Dr. Lovey Newcomer, with excision recommended. Pathology revealed INTRADUCTAL PAPILLOMA WITH FOCAL ATYPICAL DUCTAL HYPERPLASIA of the RIGHT breast, 8 o'clock. This was found to be concordant by Dr. Lovey Newcomer, with excision recommended. Pathology results were discussed with the patient by telephone. The patient reported doing well after the biopsies with tenderness at the sites. Post biopsy instructions and care were reviewed and questions were answered. The patient was encouraged to call [facility] for any additional concerns. My direct phone number was provided. Surgical consultation has been arranged with Dr. Nedra Hai at [facility] on September 06, 2020 to discuss options for excision verses high risk screening/follow-up given multiple papillomas. The patient was instructed to return for a bilateral breast MRI in 6 months, due in May 2022, and annual diagnostic mammography in April 2022. Pathology results reported by Terie Purser, RN on 07/26/2020. Electronically Signed   By: Lovey Newcomer M.D.   On: 07/26/2020 12:53   Result Date: 07/28/2020 CLINICAL DATA:  Patient with indeterminate right breast mass 1 o'clock position and right breast mass 8 o'clock position. EXAM: ULTRASOUND GUIDED RIGHT BREAST CORE NEEDLE BIOPSY COMPARISON:  Previous exam(s). PROCEDURE: I met with the patient and we discussed the procedure of ultrasound-guided biopsy, including benefits and alternatives. We discussed the high likelihood of a successful procedure. We discussed the risks of the procedure, including infection, bleeding,  tissue injury, clip migration, and inadequate sampling. Informed written consent was given. The usual time-out protocol was performed immediately prior to the procedure. Site 2: Right breast mass 1 o'clock position: Coil clip Lesion quadrant: Upper inner quadrant Using sterile technique and 1% Lidocaine as local anesthetic, under direct ultrasound visualization, a 14 gauge spring-loaded device was used to perform biopsy of right breast mass 1 o'clock position using a medial approach. At the conclusion of the procedure coil shaped tissue marker clip was deployed into the biopsy cavity. Follow up 2 view mammogram was performed and dictated separately. Site 3: Right breast mass 8 o'clock: Heart shaped clip Lesion quadrant: Lower outer quadrant Using sterile technique and 1% Lidocaine as local anesthetic, under direct ultrasound visualization, a 14 gauge spring-loaded device was used to perform biopsy of right breast mass 8 o'clock position using a lateral approach. At the conclusion of the procedure heart shaped tissue marker clip was deployed into the biopsy cavity. Follow up 2 view mammogram was performed and dictated separately. IMPRESSION: Ultrasound guided biopsy of right breast mass 1 o'clock position and mass 8 o'clock position. No apparent complications. Electronically Signed: By: Lovey Newcomer M.D. On: 07/25/2020 16:06   [place] RT BREAST BX W LOC DEV EA ADD LESION IMG BX SPEC US GUIDE  Addendum Date: 07/28/2020   ADDENDUM REPORT: 07/26/2020 12:53 ADDENDUM: Pathology revealed INTRADUCTAL PAPILLOMA of the LEFT breast, 1 o'clock. This was found to be concordant by Dr. Lovey Newcomer, with excision recommended. Pathology revealed INTRADUCTAL PAPILLOMA of the RIGHT breast, 1 o'clock. This was found to be concordant by Dr. Lovey Newcomer, with excision recommended. Pathology revealed INTRADUCTAL PAPILLOMA WITH FOCAL ATYPICAL DUCTAL HYPERPLASIA of the RIGHT breast, 8 o'clock. This was found to be concordant by Dr. Lovey Newcomer,  with excision recommended. Pathology results were discussed with the patient by telephone. The patient reported doing well after the biopsies with tenderness at the sites. Post biopsy instructions and care were reviewed and questions were answered.  The patient was encouraged to call [facility] for any additional concerns. My direct phone number was provided. Surgical consultation has been arranged with Dr. Nedra Hai at [facility] on September 06, 2020 to discuss options for excision verses high risk screening/follow-up given multiple papillomas. The patient was instructed to return for a bilateral breast MRI in 6 months, due in May 2022, and annual diagnostic mammography in April 2022. Pathology results reported by Terie Purser, RN on 07/26/2020. Electronically Signed   By: Lovey Newcomer M.D.   On: 07/26/2020 12:53   Result Date: 07/28/2020 CLINICAL DATA:  Patient with indeterminate right breast mass 1 o'clock position and right breast mass 8 o'clock position. EXAM: ULTRASOUND GUIDED RIGHT BREAST CORE NEEDLE BIOPSY COMPARISON:  Previous exam(s). PROCEDURE: I met with the patient and we discussed the procedure of ultrasound-guided biopsy, including benefits and alternatives. We discussed the high likelihood of a successful procedure. We discussed the risks of the procedure, including infection, bleeding, tissue injury, clip migration, and inadequate sampling. Informed written consent was given. The usual time-out protocol was performed immediately prior to the procedure. Site 2: Right breast mass 1 o'clock position: Coil clip Lesion quadrant: Upper inner quadrant Using sterile technique and 1% Lidocaine as local anesthetic, under direct ultrasound visualization, a 14 gauge spring-loaded device was used to perform biopsy of right breast mass 1 o'clock position using a medial approach. At the conclusion of the procedure coil shaped tissue marker clip was deployed into the  biopsy cavity. Follow up 2 view mammogram was performed and dictated separately. Site 3: Right breast mass 8 o'clock: Heart shaped clip Lesion quadrant: Lower outer quadrant Using sterile technique and 1% Lidocaine as local anesthetic, under direct ultrasound visualization, a 14 gauge spring-loaded device was used to perform biopsy of right breast mass 8 o'clock position using a lateral approach. At the conclusion of the procedure heart shaped tissue marker clip was deployed into the biopsy cavity. Follow up 2 view mammogram was performed and dictated separately. IMPRESSION: Ultrasound guided biopsy of right breast mass 1 o'clock position and mass 8 o'clock position. No apparent complications. Electronically Signed: By: Lovey Newcomer M.D. On: 07/25/2020 16:06     ELIGIBLE FOR AVAILABLE RESEARCH PROTOCOL: Decided against the COMET trial   ASSESSMENT: 69 y.o. [place], [place] Woman  (1) s/p left breast upper outer quadrant biopsy 08/27/2017 for ductal carcinoma in situ, grade 2, estrogen and progesterone receptor positive  (2) left lumpectomy with no sentinel lymph node sampling 09/25/2017 confirmed ductal carcinoma in situ, grade 2, measuring 2.1 cm, with negative margins.  (3) patient decided against adjuvant radiation.  (4) patient decided against adjuvant antiestrogens  (5) diffuse papillomatosis   (a) right breast biopsy 09/05/2018 shows an intraductal papilloma   (b) breast MRI 05/19/2019 shows multiple bilateral ducts containing enhancing intraductal masses  (c) patient decided against surgery   (d) decided against antiestrogens  (e) multicentric biopsies x3 07/25/2020 all show papillomas  (6) intensified screening:  (a) mammography with tomography every April  (b) breast MRI every October   PLAN: "[name]" is close to 3 years out from initial surgery for her left sided noninvasive breast cancer.  Her breast continue to be quite "active" and she has multiple papillomas at  present.  We discussed antiestrogens and I suggested she take anastrozole.  She has a good understanding of the possible toxicities side effects and complications of these agents.  She is pretty clear so does not want to do  that.  Mastectomy has been recommended.  She is also very clear that she does not want to do that.  All she wants to do at this point is continue intensified screening.  She will have her mammogram in May and her MRI in November.  I think by doing this if and when she does develop an invasive breast cancer we should be able to catch it early.  The downside of course is that she may need multiple biopsies in the future.  She just had 3 and this was quite uncomfortable for her.  She knows to call for any other issue that may develop before the next visit  Total encounter time 25 minutes.*   Milayna Rotenberg, Virgie Dad, MD  08/01/20 11:54 AM Medical Oncology and Hematology [facility] [place], [place] [address] Tel. [phone]    Fax. [phone]   I, Wilburn Mylar, am acting as scribe for Dr. Virgie Dad. Shailah Gibbins.  I, Lurline Del MD, have reviewed the above documentation for accuracy and completeness, and I agree with the above.   *Total Encounter Time as defined by the Centers for Medicare and Medicaid Services includes, in addition to the face-to-face time of a patient visit (documented in the note above) non-face-to-face time: obtaining and reviewing outside history, ordering and reviewing medications, tests or procedures, care coordination (communications with other health care professionals or caregivers) and documentation in the medical record.

## 2020-08-01 ENCOUNTER — Inpatient Hospital Stay: Payer: PPO | Attending: Oncology | Admitting: Oncology

## 2020-08-01 ENCOUNTER — Other Ambulatory Visit: Payer: Self-pay

## 2020-08-01 VITALS — BP 150/84 | HR 107 | Temp 97.5°F | Resp 18 | Ht 60.0 in | Wt 135.0 lb

## 2020-08-01 DIAGNOSIS — Z803 Family history of malignant neoplasm of breast: Secondary | ICD-10-CM | POA: Insufficient documentation

## 2020-08-01 DIAGNOSIS — M1990 Unspecified osteoarthritis, unspecified site: Secondary | ICD-10-CM | POA: Insufficient documentation

## 2020-08-01 DIAGNOSIS — D0512 Intraductal carcinoma in situ of left breast: Secondary | ICD-10-CM | POA: Diagnosis not present

## 2020-08-01 DIAGNOSIS — E781 Pure hyperglyceridemia: Secondary | ICD-10-CM | POA: Insufficient documentation

## 2020-08-01 DIAGNOSIS — Z791 Long term (current) use of non-steroidal anti-inflammatories (NSAID): Secondary | ICD-10-CM | POA: Diagnosis not present

## 2020-08-01 DIAGNOSIS — E785 Hyperlipidemia, unspecified: Secondary | ICD-10-CM | POA: Diagnosis not present

## 2020-08-01 DIAGNOSIS — N6019 Diffuse cystic mastopathy of unspecified breast: Secondary | ICD-10-CM | POA: Insufficient documentation

## 2020-08-01 DIAGNOSIS — I10 Essential (primary) hypertension: Secondary | ICD-10-CM | POA: Insufficient documentation

## 2020-08-01 DIAGNOSIS — Z170 Estrogen receptor positive status [ER+]: Secondary | ICD-10-CM | POA: Diagnosis not present

## 2020-08-01 DIAGNOSIS — C50412 Malignant neoplasm of upper-outer quadrant of left female breast: Secondary | ICD-10-CM

## 2020-08-01 DIAGNOSIS — K589 Irritable bowel syndrome without diarrhea: Secondary | ICD-10-CM | POA: Diagnosis not present

## 2020-08-01 DIAGNOSIS — Z809 Family history of malignant neoplasm, unspecified: Secondary | ICD-10-CM | POA: Diagnosis not present

## 2020-09-26 ENCOUNTER — Other Ambulatory Visit: Payer: Self-pay | Admitting: Oncology

## 2020-12-19 ENCOUNTER — Other Ambulatory Visit: Payer: Self-pay | Admitting: Oncology

## 2020-12-19 DIAGNOSIS — M8580 Other specified disorders of bone density and structure, unspecified site: Secondary | ICD-10-CM

## 2020-12-26 ENCOUNTER — Ambulatory Visit
Admission: RE | Admit: 2020-12-26 | Discharge: 2020-12-26 | Disposition: A | Discharge status: Home / Self Care | Payer: Medicare Other | Source: Ambulatory Visit | Attending: Oncology | Admitting: Oncology

## 2020-12-26 ENCOUNTER — Other Ambulatory Visit: Payer: Self-pay

## 2020-12-26 ENCOUNTER — Other Ambulatory Visit: Payer: Self-pay | Admitting: Oncology

## 2020-12-26 DIAGNOSIS — C50412 Malignant neoplasm of upper-outer quadrant of left female breast: Secondary | ICD-10-CM

## 2020-12-26 DIAGNOSIS — R928 Other abnormal and inconclusive findings on diagnostic imaging of breast: Secondary | ICD-10-CM

## 2020-12-26 DIAGNOSIS — Z170 Estrogen receptor positive status [ER+]: Secondary | ICD-10-CM

## 2020-12-26 DIAGNOSIS — D0512 Intraductal carcinoma in situ of left breast: Secondary | ICD-10-CM

## 2020-12-29 ENCOUNTER — Encounter: Payer: Self-pay | Admitting: Oncology

## 2020-12-29 ENCOUNTER — Other Ambulatory Visit: Payer: Self-pay | Admitting: *Deleted

## 2020-12-29 ENCOUNTER — Other Ambulatory Visit: Payer: Self-pay

## 2020-12-29 ENCOUNTER — Ambulatory Visit
Admission: RE | Admit: 2020-12-29 | Discharge: 2020-12-29 | Disposition: A | Discharge status: Home / Self Care | Payer: Medicare Other | Source: Ambulatory Visit | Attending: Oncology | Admitting: Oncology

## 2020-12-29 DIAGNOSIS — C50412 Malignant neoplasm of upper-outer quadrant of left female breast: Secondary | ICD-10-CM

## 2020-12-29 DIAGNOSIS — R928 Other abnormal and inconclusive findings on diagnostic imaging of breast: Secondary | ICD-10-CM

## 2020-12-29 DIAGNOSIS — D0512 Intraductal carcinoma in situ of left breast: Secondary | ICD-10-CM

## 2020-12-29 DIAGNOSIS — M8580 Other specified disorders of bone density and structure, unspecified site: Secondary | ICD-10-CM

## 2020-12-29 DIAGNOSIS — Z170 Estrogen receptor positive status [ER+]: Secondary | ICD-10-CM

## 2020-12-29 DIAGNOSIS — D241 Benign neoplasm of right breast: Secondary | ICD-10-CM

## 2021-02-07 ENCOUNTER — Other Ambulatory Visit: Payer: Self-pay

## 2021-02-07 ENCOUNTER — Emergency Department (HOSPITAL_BASED_OUTPATIENT_CLINIC_OR_DEPARTMENT_OTHER): Payer: Medicare Other

## 2021-02-07 ENCOUNTER — Emergency Department (HOSPITAL_BASED_OUTPATIENT_CLINIC_OR_DEPARTMENT_OTHER)
Admission: EM | Admit: 2021-02-07 | Discharge: 2021-02-08 | Disposition: A | Discharge status: Home / Self Care | Payer: Medicare Other | Attending: Emergency Medicine | Admitting: Emergency Medicine

## 2021-02-07 ENCOUNTER — Encounter (HOSPITAL_BASED_OUTPATIENT_CLINIC_OR_DEPARTMENT_OTHER): Payer: Self-pay | Admitting: Obstetrics and Gynecology

## 2021-02-07 DIAGNOSIS — Z79899 Other long term (current) drug therapy: Secondary | ICD-10-CM | POA: Insufficient documentation

## 2021-02-07 DIAGNOSIS — R112 Nausea with vomiting, unspecified: Secondary | ICD-10-CM

## 2021-02-07 DIAGNOSIS — R1084 Generalized abdominal pain: Secondary | ICD-10-CM

## 2021-02-07 DIAGNOSIS — K5730 Diverticulosis of large intestine without perforation or abscess without bleeding: Secondary | ICD-10-CM | POA: Insufficient documentation

## 2021-02-07 DIAGNOSIS — R109 Unspecified abdominal pain: Secondary | ICD-10-CM | POA: Diagnosis present

## 2021-02-07 DIAGNOSIS — Z853 Personal history of malignant neoplasm of breast: Secondary | ICD-10-CM | POA: Insufficient documentation

## 2021-02-07 DIAGNOSIS — I10 Essential (primary) hypertension: Secondary | ICD-10-CM | POA: Insufficient documentation

## 2021-02-07 LAB — URINALYSIS, ROUTINE W REFLEX MICROSCOPIC
Bilirubin Urine: NEGATIVE
Glucose, UA: NEGATIVE mg/dL
Hgb urine dipstick: NEGATIVE
Ketones, ur: 15 mg/dL — AB
Leukocytes,Ua: NEGATIVE
Nitrite: NEGATIVE
Protein, ur: NEGATIVE mg/dL
Specific Gravity, Urine: 1.01 (ref 1.005–1.030)
pH: 7 (ref 5.0–8.0)

## 2021-02-07 LAB — CBC
HCT: 43.2 % (ref 36.0–46.0)
Hemoglobin: 15.1 g/dL — ABNORMAL HIGH (ref 12.0–15.0)
MCH: 31.5 pg (ref 26.0–34.0)
MCHC: 35 g/dL (ref 30.0–36.0)
MCV: 90.2 fL (ref 80.0–100.0)
Platelets: 268 10*3/uL (ref 150–400)
RBC: 4.79 MIL/uL (ref 3.87–5.11)
RDW: 12.1 % (ref 11.5–15.5)
WBC: 6.8 10*3/uL (ref 4.0–10.5)
nRBC: 0 % (ref 0.0–0.2)

## 2021-02-07 LAB — COMPREHENSIVE METABOLIC PANEL
ALT: 38 U/L (ref 0–44)
AST: 24 U/L (ref 15–41)
Albumin: 4.4 g/dL (ref 3.5–5.0)
Alkaline Phosphatase: 78 U/L (ref 38–126)
Anion gap: 12 (ref 5–15)
BUN: 14 mg/dL (ref 8–23)
CO2: 26 mmol/L (ref 22–32)
Calcium: 9.6 mg/dL (ref 8.9–10.3)
Chloride: 99 mmol/L (ref 98–111)
Creatinine, Ser: 0.83 mg/dL (ref 0.44–1.00)
GFR, Estimated: >60 mL/min (ref >60)
Glucose, Bld: 120 mg/dL — ABNORMAL HIGH (ref 70–99)
Potassium: 3.8 mmol/L (ref 3.5–5.1)
Sodium: 137 mmol/L (ref 135–145)
Total Bilirubin: 0.5 mg/dL (ref 0.3–1.2)
Total Protein: 7.2 g/dL (ref 6.5–8.1)

## 2021-02-07 LAB — LIPASE, BLOOD: Lipase: 41 U/L (ref 11–51)

## 2021-02-07 MED ORDER — IOHEXOL 300 MG/ML  SOLN
80.0000 mL | Freq: Once | INTRAMUSCULAR | Status: AC | PRN
  Administered 2021-02-07: 80 mL via INTRAVENOUS

## 2021-02-07 MED ORDER — ONDANSETRON HCL 4 MG/2ML IJ SOLN
4.0000 mg | Freq: Once | INTRAMUSCULAR | Status: AC
  Administered 2021-02-07: 4 mg via INTRAVENOUS
  Filled 2021-02-07: qty 2

## 2021-02-07 MED ORDER — MORPHINE SULFATE (PF) 2 MG/ML IV SOLN
1.0000 mg | Freq: Once | INTRAVENOUS | Status: AC
  Administered 2021-02-07: 1 mg via INTRAVENOUS
  Filled 2021-02-07: qty 1

## 2021-02-07 MED ORDER — HYDROMORPHONE HCL 1 MG/ML IJ SOLN
1.0000 mg | Freq: Once | INTRAMUSCULAR | Status: AC
  Administered 2021-02-07: 1 mg via INTRAVENOUS
  Filled 2021-02-07: qty 1

## 2021-02-07 MED ORDER — SODIUM CHLORIDE 0.9 % IV SOLN: INTRAVENOUS | Status: DC

## 2021-02-07 MED ORDER — HYDROMORPHONE HCL 1 MG/ML IJ SOLN
0.5000 mg | Freq: Once | INTRAMUSCULAR | Status: DC
  Filled 2021-02-07: qty 1

## 2021-02-07 NOTE — ED Notes (Signed)
Pt asked to provide a urine sample. Pt stated that she is not able at this time.

## 2021-02-07 NOTE — ED Triage Notes (Signed)
Patient reports to the ER for abdominal pain. Patient reports she has been having severe abdominal pain since noon today. Patient states she has been nauseated.

## 2021-02-07 NOTE — ED Provider Notes (Signed)
[facility]ERGENC[facility] DEPT Provider Note   CSN: [id number] Arrival date & time: 02/07/21  1527     History Chief Complaint  Patient presents with   Abdominal Pain    [name] is a 70 y.o. female.  Patient with acute onset of abdominal pain today at noon with nausea no vomiting no fever.  Abdominal pains all over but its greater on the right side.  Patient has a history of left breast cancer.  Patient felt fine earlier in the day and felt fine yesterday.  Patient states that similar pain is related to diverticulitis in the past.  Patient denies any chest pain.  Past medical history significant for history of irritable bowel syndrome.      Past Medical History:  Diagnosis Date   Abnormal liver function test    Arthritis    knees, hands & hips, back    Breast cancer ([place]) 2019   Left Breast Cancer   Cancer ([place])    DCIS- L breast   Complication of anesthesia    Depression    post partum, also related to her in her 20's   Dyslipidemia    Esophageal spasm    rare occurence , /w ? reflux, no treatment or med. thus far   Headache    tension , sporadic    Hypertension    Hypertriglyceridemia    IBS (irritable bowel syndrome)    PONV (postoperative nausea and vomiting)    Sleep disturbance     Patient Active Problem List   Diagnosis Date Noted   Diffuse intraductal papillomatosis 09/21/2019   Nausea with vomiting 01/29/2019   Total bilirubin, elevated 01/27/2019   UTI symptoms 01/27/2019   Acute diverticulitis 01/26/2019   Papilloma of right breast 09/12/2018   Malignant neoplasm of upper-outer quadrant of left breast in female, estrogen receptor positive ([place]) 09/22/2017   Ductal carcinoma in situ (DCIS) of left breast 09/22/2017   Pain in the chest 11/12/2014   Essential hypertension 11/12/2014    Past Surgical History:  Procedure Laterality Date   BREAST BIOPSY Left 08/2017   BREAST BIOPSY Right 09/05/2018   BREAST BIOPSY Left  07/25/2020   BREAST BIOPSY Right 07/25/2020   x2   BREAST LUMPECTOMY Left 09/2017   BREAST LUMPECTOMY WITH RADIOACTIVE SEED LOCALIZATION Left 09/25/2017   Procedure: LEFT BREAST PARTIAL MASTECTOMY WITH RADIOACTIVE SEED LOCALIZATION ERAS PATHWAY;  Surgeon: Coralie Keens, MD;  Location: [facility];  Service: General;  Laterality: Left;   BREAST REDUCTION SURGERY     CARDIOVASCULAR STRESS TEST     [place] ARTHROSCOPY Right 2004   REDUCTION MAMMAPLASTY Bilateral 1997   ROOT CANAL     SHOULDER SURGERY Right    2012- arthroscopy   TONSILLECTOMY       OB History     Gravida  1   Para      Term      Preterm      AB      Living  1      SAB      IAB      Ectopic      Multiple      Live Births  1           Family History  Problem Relation Age of Onset   Cancer Mother    Breast cancer Mother 32   Cancer Father     Social History   Tobacco Use   Smoking status:  Never   Smokeless tobacco: Never  Substance Use Topics   Alcohol use: No   Drug use: No    Home Medications Prior to Admission medications   Medication Sig Start Date End Date Taking? Authorizing Provider  acetaminophen (TYLENOL) 325 MG tablet Take 650 mg by mouth every 6 (six) hours as needed for moderate pain or headache.    [provider]  diclofenac sodium (VOLTAREN) 1 % GEL Apply 2 g topically 4 (four) times daily.    [provider]  Glucosamine-Chondroitin-MSM [phone] MG TABS Take 2 tablets by mouth daily.    [provider]  irbesartan-hydrochlorothiazide (AVALIDE) 150-12.5 MG tablet Take 1 tablet by mouth daily. 12/21/18   [provider]  Multiple Vitamin (MULTIVITAMIN WITH MINERALS) TABS tablet Take 1 tablet by mouth daily.     [provider]  polyethylene glycol (MIRALAX / GLYCOLAX) packet Take 17 g by mouth daily as needed for moderate constipation.     [provider]  Probiotic Product (PROBIOTIC DAILY PO)  Take 1 tablet by mouth as needed (for health).     [provider]  Simethicone (PHAZYME) 180 MG CAPS Take 180-360 mg by mouth daily as needed (for gas).    [provider]  zaleplon (SONATA) 10 MG capsule Take 10 mg by mouth at bedtime as needed for sleep.    [provider]    Allergies    Yeast, Fenofibrate, Glycerine [glycerin], Keflex [cephalexin], and Tetracyclines & related  Review of Systems   Review of Systems  Constitutional:  Negative for chills and fever.  HENT:  Negative for ear pain and sore throat.   Eyes:  Negative for pain and visual disturbance.  Respiratory:  Negative for cough and shortness of breath.   Cardiovascular:  Negative for chest pain and palpitations.  Gastrointestinal:  Positive for abdominal pain, nausea and vomiting. Negative for diarrhea.  Genitourinary:  Negative for dysuria and hematuria.  Musculoskeletal:  Negative for arthralgias and back pain.  Skin:  Negative for color change and rash.  Neurological:  Negative for seizures and syncope.  All other systems reviewed and are negative.  Physical Exam Updated Vital Signs BP (!) 169/102 (BP Location: Right Arm)   Pulse 67   Temp 97.9 F (36.6 C) (Oral)   Resp 16   Ht 1.549 m (5\' 1" )   Wt 63 kg   SpO2 100%   BMI 26.26 kg/m   Physical Exam Vitals and nursing note reviewed.  Constitutional:      General: She is in acute distress.     Appearance: Normal appearance. She is well-developed.  HENT:     Head: Normocephalic and atraumatic.  Eyes:     Conjunctiva/sclera: Conjunctivae normal.  Cardiovascular:     Rate and Rhythm: Normal rate and regular rhythm.     Heart sounds: No murmur heard. Pulmonary:     Effort: Pulmonary effort is normal. No respiratory distress.     Breath sounds: Normal breath sounds.  Abdominal:     General: There is no distension.     Palpations: Abdomen is soft.     Tenderness: There is no abdominal tenderness. There is no guarding.   Musculoskeletal:        General: No swelling.     Cervical back: Normal range of motion and neck supple. No rigidity.  Skin:    General: Skin is warm and dry.     Capillary Refill: Capillary refill takes less than 2 seconds.  Neurological:  General: No focal deficit present.     Mental Status: She is alert and oriented to person, place, and time.    ED Results / Procedures / Treatments   Labs (all labs ordered are listed, but only abnormal results are displayed) Labs Reviewed  CBC - Abnormal; Notable for the following components:      Result Value   Hemoglobin 15.1 (*)    All other components within normal limits  LIPASE, BLOOD  COMPREHENSIVE METABOLIC PANEL  URINALYSIS, ROUTINE W REFLEX MICROSCOPIC    EKG None  Radiology No results found.  Procedures Procedures   Medications Ordered in ED Medications - No data to display  ED Course  I have reviewed the triage vital signs and the nursing notes.  Pertinent labs & imaging results that were available during my care of the patient were reviewed by me and considered in my medical decision making (see chart for details).    MDM Rules/Calculators/A&P                          Extensive work-up without any acute findings.  Lipase normal complete metabolic panel normal urinalysis negative for urinary tract infection.  Liver function test normal.  No leukocytosis.  Hemoglobin is 15.1.  CT scan of the abdomen shows extensive sigmoid diverticulosis without any evidence of acute diverticulitis.  And also based on her lab results would not suspect this.  Feel that patient has a gastro neuritis.  May be viral.  Struggle to control her pain pain medicine seem to make significant nausea and vomiting.  Zofran did not seem to help.  Eventually I gave her a 1 mg of morphine which helped her pain and she settled down.  Orts perhaps the illnesses starting to improve.  Patient stable for discharge home follow-up with her doctor.  Will be  given some ODT Zofran and tramadol as an outpatient prescription to pick up from her pharmacy.  She will return for any new or worse symptoms Final Clinical Impression(s) / ED Diagnoses Final diagnoses:  None    Rx / DC Orders ED Discharge Orders     None        Fredia Sorrow, MD 02/08/21 0007

## 2021-02-07 NOTE — ED Notes (Signed)
Pt incontinent large amount of urine, unable to obtain sample. purewick  Now in place to obtain sample

## 2021-02-08 MED ORDER — ONDANSETRON 4 MG PO TBDP: 4.0000 mg | ORAL_TABLET | Freq: Three times a day (TID) | ORAL | 1 refills | Status: DC | PRN

## 2021-02-08 MED ORDER — TRAMADOL HCL 50 MG PO TABS: 50.0000 mg | ORAL_TABLET | Freq: Four times a day (QID) | ORAL | 0 refills | Status: DC | PRN

## 2021-02-08 NOTE — Discharge Instructions (Addendum)
Work-up for the abdominal pain nausea vomiting without any acute findings.  Try the dissolvable Zofran and tramadol for the pain.  Make an appointment to follow up with your regular doctor.  Return for any new or worse symptoms.  Glad that you are feeling somewhat better.

## 2021-02-10 ENCOUNTER — Emergency Department (HOSPITAL_COMMUNITY): Payer: Medicare Other

## 2021-02-10 ENCOUNTER — Emergency Department (HOSPITAL_COMMUNITY)
Admission: EM | Admit: 2021-02-10 | Discharge: 2021-02-11 | Disposition: A | Discharge status: Home / Self Care | Payer: Medicare Other | Attending: Physician Assistant | Admitting: Physician Assistant

## 2021-02-10 ENCOUNTER — Encounter (HOSPITAL_COMMUNITY): Payer: Self-pay | Admitting: Emergency Medicine

## 2021-02-10 DIAGNOSIS — Z5321 Procedure and treatment not carried out due to patient leaving prior to being seen by health care provider: Secondary | ICD-10-CM | POA: Diagnosis not present

## 2021-02-10 DIAGNOSIS — R112 Nausea with vomiting, unspecified: Secondary | ICD-10-CM | POA: Diagnosis not present

## 2021-02-10 DIAGNOSIS — R109 Unspecified abdominal pain: Secondary | ICD-10-CM

## 2021-02-10 DIAGNOSIS — R1084 Generalized abdominal pain: Secondary | ICD-10-CM | POA: Diagnosis not present

## 2021-02-10 DIAGNOSIS — K5900 Constipation, unspecified: Secondary | ICD-10-CM | POA: Diagnosis not present

## 2021-02-10 DIAGNOSIS — R1110 Vomiting, unspecified: Secondary | ICD-10-CM | POA: Diagnosis not present

## 2021-02-10 DIAGNOSIS — K760 Fatty (change of) liver, not elsewhere classified: Secondary | ICD-10-CM | POA: Diagnosis not present

## 2021-02-10 LAB — CBC WITH DIFFERENTIAL/PLATELET
Abs Immature Granulocytes: 0.07 10*3/uL (ref 0.00–0.07)
Basophils Absolute: 0 10*3/uL (ref 0.0–0.1)
Basophils Relative: 0 %
Eosinophils Absolute: 0 10*3/uL (ref 0.0–0.5)
Eosinophils Relative: 0 %
HCT: 43.5 % (ref 36.0–46.0)
Hemoglobin: 15 g/dL (ref 12.0–15.0)
Immature Granulocytes: 1 %
Lymphocytes Relative: 15 %
Lymphs Abs: 2.3 10*3/uL (ref 0.7–4.0)
MCH: 31.5 pg (ref 26.0–34.0)
MCHC: 34.5 g/dL (ref 30.0–36.0)
MCV: 91.4 fL (ref 80.0–100.0)
Monocytes Absolute: 0.9 10*3/uL (ref 0.1–1.0)
Monocytes Relative: 6 %
Neutro Abs: 11.5 10*3/uL — ABNORMAL HIGH (ref 1.7–7.7)
Neutrophils Relative %: 78 %
Platelets: 294 10*3/uL (ref 150–400)
RBC: 4.76 MIL/uL (ref 3.87–5.11)
RDW: 11.9 % (ref 11.5–15.5)
WBC: 14.8 10*3/uL — ABNORMAL HIGH (ref 4.0–10.5)
nRBC: 0 % (ref 0.0–0.2)

## 2021-02-10 LAB — COMPREHENSIVE METABOLIC PANEL
ALT: 70 U/L — ABNORMAL HIGH (ref 0–44)
AST: 55 U/L — ABNORMAL HIGH (ref 15–41)
Albumin: 3.8 g/dL (ref 3.5–5.0)
Alkaline Phosphatase: 131 U/L — ABNORMAL HIGH (ref 38–126)
Anion gap: 11 (ref 5–15)
BUN: 12 mg/dL (ref 8–23)
CO2: 27 mmol/L (ref 22–32)
Calcium: 9.3 mg/dL (ref 8.9–10.3)
Chloride: 96 mmol/L — ABNORMAL LOW (ref 98–111)
Creatinine, Ser: 1.01 mg/dL — ABNORMAL HIGH (ref 0.44–1.00)
GFR, Estimated: 59 mL/min — ABNORMAL LOW (ref >60)
Glucose, Bld: 115 mg/dL — ABNORMAL HIGH (ref 70–99)
Potassium: 3.4 mmol/L — ABNORMAL LOW (ref 3.5–5.1)
Sodium: 134 mmol/L — ABNORMAL LOW (ref 135–145)
Total Bilirubin: 1.3 mg/dL — ABNORMAL HIGH (ref 0.3–1.2)
Total Protein: 7.5 g/dL (ref 6.5–8.1)

## 2021-02-10 LAB — URINALYSIS, ROUTINE W REFLEX MICROSCOPIC
Bilirubin Urine: NEGATIVE
Glucose, UA: NEGATIVE mg/dL
Ketones, ur: 80 mg/dL — AB
Leukocytes,Ua: NEGATIVE
Nitrite: NEGATIVE
Protein, ur: NEGATIVE mg/dL
Specific Gravity, Urine: 1.024 (ref 1.005–1.030)
pH: 5 (ref 5.0–8.0)

## 2021-02-10 LAB — LIPASE, BLOOD: Lipase: 31 U/L (ref 11–51)

## 2021-02-10 MED ORDER — IOHEXOL 300 MG/ML  SOLN
100.0000 mL | Freq: Once | INTRAMUSCULAR | Status: AC | PRN
  Administered 2021-02-10: 100 mL via INTRAVENOUS

## 2021-02-10 NOTE — ED Triage Notes (Signed)
Pt arrive POV for c/o continue abd pain for the past few days with nausea and vomiting, not getting any better. Last bm on Sunday.

## 2021-02-10 NOTE — ED Provider Notes (Signed)
Emergency Medicine Provider Triage Evaluation Note  [name] , a 70 y.o. female  was evaluated in triage.  Pt complains of abdominal pain.  She states that her sx started about five days ago. She was evaluated in the ED on 6/28 and had a reassuring workup including a CT scan of the abd/pelvis. She has been constipated since the onset of her abdominal pain and denies any BMs. She has taken miralax, senna and dulcolax suppositories without any relief. She went to the [facility] walk in clinic today and had an abdominal xray and was told to come back to the ED. She reports an episode of vomiting two days ago but no persistent n/v.   Physical Exam  BP 133/85   Pulse (!) 108   Temp 99.8 F (37.7 C) (Oral)   Resp 16   SpO2 96%  Gen:   Awake, no distress   Resp:  Normal effort  MSK:   Moves extremities without difficulty  Other:    Medical Decision Making  Medically screening exam initiated at 2:13 PM.  Appropriate orders placed.  [name] was informed that the remainder of the evaluation will be completed by another provider, this initial triage assessment does not replace that evaluation, and the importance of remaining in the ED until their evaluation is complete.     Rayna Sexton, PA-C 02/10/21 1415    Horton, Alvin Critchley, DO 02/13/21 1856

## 2021-02-11 NOTE — ED Notes (Signed)
Pt left, moving OTF.

## 2021-02-20 DIAGNOSIS — K5790 Diverticulosis of intestine, part unspecified, without perforation or abscess without bleeding: Secondary | ICD-10-CM | POA: Diagnosis not present

## 2021-02-22 DIAGNOSIS — K5792 Diverticulitis of intestine, part unspecified, without perforation or abscess without bleeding: Secondary | ICD-10-CM | POA: Diagnosis not present

## 2021-02-22 DIAGNOSIS — K5732 Diverticulitis of large intestine without perforation or abscess without bleeding: Secondary | ICD-10-CM | POA: Diagnosis not present

## 2021-03-23 DIAGNOSIS — R350 Frequency of micturition: Secondary | ICD-10-CM | POA: Diagnosis not present

## 2021-04-30 ENCOUNTER — Encounter: Payer: Self-pay | Admitting: Oncology

## 2021-05-01 ENCOUNTER — Telehealth: Payer: Self-pay

## 2021-05-01 ENCOUNTER — Other Ambulatory Visit: Payer: Self-pay

## 2021-05-01 DIAGNOSIS — Z170 Estrogen receptor positive status [ER+]: Secondary | ICD-10-CM

## 2021-05-01 DIAGNOSIS — L820 Inflamed seborrheic keratosis: Secondary | ICD-10-CM | POA: Diagnosis not present

## 2021-05-01 DIAGNOSIS — L570 Actinic keratosis: Secondary | ICD-10-CM | POA: Diagnosis not present

## 2021-05-01 DIAGNOSIS — C50412 Malignant neoplasm of upper-outer quadrant of left female breast: Secondary | ICD-10-CM

## 2021-05-01 DIAGNOSIS — D1801 Hemangioma of skin and subcutaneous tissue: Secondary | ICD-10-CM | POA: Diagnosis not present

## 2021-05-01 DIAGNOSIS — D225 Melanocytic nevi of trunk: Secondary | ICD-10-CM | POA: Diagnosis not present

## 2021-05-01 DIAGNOSIS — L821 Other seborrheic keratosis: Secondary | ICD-10-CM | POA: Diagnosis not present

## 2021-05-01 DIAGNOSIS — L723 Sebaceous cyst: Secondary | ICD-10-CM | POA: Diagnosis not present

## 2021-05-01 NOTE — Telephone Encounter (Signed)
Called pt regarding mychart message. Pt was offered appt with Wilber Bihari, NP for 9/20 at 1:15 with 12:45 lab appt. Pt accepted appt.

## 2021-05-02 ENCOUNTER — Inpatient Hospital Stay: Payer: Medicare Other | Attending: Adult Health

## 2021-05-02 ENCOUNTER — Other Ambulatory Visit: Payer: Self-pay

## 2021-05-02 ENCOUNTER — Inpatient Hospital Stay (HOSPITAL_BASED_OUTPATIENT_CLINIC_OR_DEPARTMENT_OTHER): Payer: Medicare Other | Admitting: Adult Health

## 2021-05-02 VITALS — BP 134/70 | HR 75 | Temp 97.5°F | Resp 18 | Ht 61.0 in | Wt 136.2 lb

## 2021-05-02 DIAGNOSIS — E781 Pure hyperglyceridemia: Secondary | ICD-10-CM | POA: Insufficient documentation

## 2021-05-02 DIAGNOSIS — Z79899 Other long term (current) drug therapy: Secondary | ICD-10-CM | POA: Insufficient documentation

## 2021-05-02 DIAGNOSIS — Z809 Family history of malignant neoplasm, unspecified: Secondary | ICD-10-CM | POA: Insufficient documentation

## 2021-05-02 DIAGNOSIS — D0512 Intraductal carcinoma in situ of left breast: Secondary | ICD-10-CM | POA: Diagnosis not present

## 2021-05-02 DIAGNOSIS — Z170 Estrogen receptor positive status [ER+]: Secondary | ICD-10-CM

## 2021-05-02 DIAGNOSIS — C50412 Malignant neoplasm of upper-outer quadrant of left female breast: Secondary | ICD-10-CM | POA: Diagnosis not present

## 2021-05-02 DIAGNOSIS — E785 Hyperlipidemia, unspecified: Secondary | ICD-10-CM | POA: Insufficient documentation

## 2021-05-02 DIAGNOSIS — Z803 Family history of malignant neoplasm of breast: Secondary | ICD-10-CM | POA: Insufficient documentation

## 2021-05-02 DIAGNOSIS — K589 Irritable bowel syndrome without diarrhea: Secondary | ICD-10-CM | POA: Insufficient documentation

## 2021-05-02 DIAGNOSIS — I10 Essential (primary) hypertension: Secondary | ICD-10-CM | POA: Diagnosis not present

## 2021-05-02 LAB — CMP (CANCER CENTER ONLY)
ALT: 37 U/L (ref 0–44)
AST: 27 U/L (ref 15–41)
Albumin: 4.2 g/dL (ref 3.5–5.0)
Alkaline Phosphatase: 68 U/L (ref 38–126)
Anion gap: 10 (ref 5–15)
BUN: 20 mg/dL (ref 8–23)
CO2: 26 mmol/L (ref 22–32)
Calcium: 9.8 mg/dL (ref 8.9–10.3)
Chloride: 102 mmol/L (ref 98–111)
Creatinine: 0.99 mg/dL (ref 0.44–1.00)
GFR, Estimated: >60 mL/min (ref >60)
Glucose, Bld: 82 mg/dL (ref 70–99)
Potassium: 3.5 mmol/L (ref 3.5–5.1)
Sodium: 138 mmol/L (ref 135–145)
Total Bilirubin: 0.6 mg/dL (ref 0.3–1.2)
Total Protein: 7.3 g/dL (ref 6.5–8.1)

## 2021-05-02 LAB — CBC WITH DIFFERENTIAL (CANCER CENTER ONLY)
Abs Immature Granulocytes: 0.01 10*3/uL (ref 0.00–0.07)
Basophils Absolute: 0.1 10*3/uL (ref 0.0–0.1)
Basophils Relative: 1 %
Eosinophils Absolute: 0.2 10*3/uL (ref 0.0–0.5)
Eosinophils Relative: 3 %
HCT: 38.2 % (ref 36.0–46.0)
Hemoglobin: 13.4 g/dL (ref 12.0–15.0)
Immature Granulocytes: 0 %
Lymphocytes Relative: 47 %
Lymphs Abs: 3 10*3/uL (ref 0.7–4.0)
MCH: 31.3 pg (ref 26.0–34.0)
MCHC: 35.1 g/dL (ref 30.0–36.0)
MCV: 89.3 fL (ref 80.0–100.0)
Monocytes Absolute: 0.4 10*3/uL (ref 0.1–1.0)
Monocytes Relative: 6 %
Neutro Abs: 2.7 10*3/uL (ref 1.7–7.7)
Neutrophils Relative %: 43 %
Platelet Count: 254 10*3/uL (ref 150–400)
RBC: 4.28 MIL/uL (ref 3.87–5.11)
RDW: 12.6 % (ref 11.5–15.5)
WBC Count: 6.2 10*3/uL (ref 4.0–10.5)
nRBC: 0 % (ref 0.0–0.2)

## 2021-05-02 NOTE — Progress Notes (Signed)
[place]  Telephone:([phone]) [phone] Fax:(336) [phone]    ID: [name] DOB: [birth year]  MR#: [id number]  JYN#:[id number]  Patient Care Team: [name], MD as PCP - General (Family Medicine) Magrinat, Virgie Dad, MD as Consulting Physician (Oncology) Coralie Keens, MD as Consulting Physician (General Surgery) Kyung Rudd, MD as Consulting Physician (Radiation Oncology) Alden Hipp, MD as Consulting Physician (Obstetrics and Gynecology) Clarene Essex, MD as Consulting Physician (Gastroenterology) Cristine Polio, MD as Consulting Physician (Plastic Surgery) OTHER MD:   CHIEF COMPLAINT: estrogen receptor positive DCIS; diffuse papillomatosis  CURRENT TREATMENT: Observation   INTERVAL HISTORY: "[name]" returns today for evaluation of her left breast.  She has history of diffuse papillomatosis and prior history of ductal carcinoma in situ. She continues under observation.  She is concerned because she developed pain in her breast yesterday, and wanted to come in and have it checked.  She says that today the pain has resolved.  She denies any new lumps or bumps in her breast, and she denies any swelling in her arms.      Review of Systems:  Review of Systems  Constitutional:  Negative for appetite change, chills, fatigue, fever and unexpected weight change.  HENT:   Negative for hearing loss, lump/mass and trouble swallowing.   Eyes:  Negative for eye problems and icterus.  Respiratory:  Negative for chest tightness, cough and shortness of breath.   Cardiovascular:  Negative for chest pain, leg swelling and palpitations.  Gastrointestinal:  Negative for abdominal distention, abdominal pain, constipation, diarrhea, nausea and vomiting.  Endocrine: Negative for hot flashes.  Genitourinary:  Negative for difficulty urinating.   Musculoskeletal:  Negative for arthralgias.  Skin:  Negative for itching and rash.  Neurological:  Negative for  dizziness, extremity weakness, headaches and numbness.  Hematological:  Negative for adenopathy. Does not bruise/bleed easily.  Psychiatric/Behavioral:  Negative for depression. The patient is not nervous/anxious.      COVID 19 VACCINATION STATUS: fully vaccinated [employer]) with booster October 2021   HISTORY OF CURRENT ILLNESS: From the original intake note:  [name] had routine mammography at [facility] under Dr. Alden Hipp on 08/17/2017 that found a possible abnormality in the left breast. She underwent unilateral left diagnostic mammography with tomography at [place] on 08/26/2017 showing: breast density category B. Grouped calcification in the upper outer quadrant of the left breast at middle depth are suspicious for malignancy.  Accordingly on 08/27/2017 she proceeded to biopsy of the left breast area in question. The pathology from this procedure showed ([id number]): Ductal carcinoma in situ with calcifications. Prognostic indicators significant for: estrogen receptor, 95% positive and progesterone receptor, 90% positive, both with strong staining intensity.   The patient's subsequent history is as detailed below.   PAST MEDICAL HISTORY: Past Medical History:  Diagnosis Date   Abnormal liver function test    Arthritis    knees, hands & hips, back    Breast cancer ([place]) 2019   Left Breast Cancer   Cancer ([place])    DCIS- L breast   Complication of anesthesia    Depression    post partum, also related to her in her 20's   Dyslipidemia    Esophageal spasm    rare occurence , /w ? reflux, no treatment or med. thus far   Headache    tension , sporadic    Hypertension    Hypertriglyceridemia    IBS (irritable bowel syndrome)    PONV (postoperative nausea and vomiting)  Sleep disturbance   Yeast allergy, HTN   PAST SURGICAL HISTORY: Past Surgical History:  Procedure Laterality Date   BREAST BIOPSY Left 08/2017   BREAST BIOPSY Right  09/05/2018   BREAST BIOPSY Left 07/25/2020   BREAST BIOPSY Right 07/25/2020   x2   BREAST LUMPECTOMY Left 09/2017   BREAST LUMPECTOMY WITH RADIOACTIVE SEED LOCALIZATION Left 09/25/2017   Procedure: LEFT BREAST PARTIAL MASTECTOMY WITH RADIOACTIVE SEED LOCALIZATION ERAS PATHWAY;  Surgeon: Coralie Keens, MD;  Location: [facility];  Service: General;  Laterality: Left;   BREAST REDUCTION SURGERY     CARDIOVASCULAR STRESS TEST     [place] ARTHROSCOPY Right 2004   REDUCTION MAMMAPLASTY Bilateral 1997   ROOT CANAL     SHOULDER SURGERY Right    2012- arthroscopy   TONSILLECTOMY    C-section 1984. Right shoulder and right knee surgery.   FAMILY HISTORY Family History  Problem Relation Age of Onset   Cancer Mother    Breast cancer Mother 27   Cancer Father   The patient's mother died at age 78 due to breast cancer, diagnosed in her 35s. The patient's father died at age 65 due to colon cancer. The patient has 1 brother and no sisters. She denies a family history of other breast, ovarian, prostate, or colon cancers.    GYNECOLOGIC HISTORY:  No LMP recorded. Patient is postmenopausal. 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 [name], her husband, is retired from being a Dispensing optician in the Therapist, music. The patient's son, Lori Mare "[address] [place], is a Geophysicist/field seismologist in [place], [place]. The patient has no grandchildren. She belongs to the [employer].    ADVANCED DIRECTIVES: In the absence of any documentation to the contrary, the patient's spouse is their HCPOA.    HEALTH MAINTENANCE: Social History   Tobacco Use   Smoking status: Never   Smokeless tobacco: Never  Substance Use Topics   Alcohol use: No   Drug  use: No     Colonoscopy: within 2 years  PAP: 2017  Bone density: more than 2 years ago: osteopenia (2009) under Dr. Theadore Nan    Allergies  Allergen Reactions   Yeast Other (See Comments)    Abdominal pain   Fenofibrate Other (See Comments)    Unknown   Glycerine [Glycerin] Nausea And Vomiting   Keflex [Cephalexin] Nausea And Vomiting   Tetracyclines & Related Nausea And Vomiting    Current Outpatient Medications  Medication Sig Dispense Refill   acetaminophen (TYLENOL) 325 MG tablet Take 650 mg by mouth every 6 (six) hours as needed for moderate pain or headache.     diclofenac sodium (VOLTAREN) 1 % GEL Apply 2 g topically 4 (four) times daily.     Glucosamine-Chondroitin-MSM 500-400-125 MG TABS Take 2 tablets by mouth daily.     irbesartan-hydrochlorothiazide (AVALIDE) 150-12.5 MG tablet Take 1 tablet by mouth daily.     Multiple Vitamin (MULTIVITAMIN WITH MINERALS) TABS tablet Take 1 tablet by mouth daily.      ondansetron (ZOFRAN ODT) 4 MG disintegrating tablet Take 1 tablet (4 mg total) by mouth every 8 (eight) hours as needed for nausea or vomiting. 12 tablet 1   polyethylene glycol (  MIRALAX / GLYCOLAX) packet Take 17 g by mouth daily as needed for moderate constipation.      Probiotic Product (PROBIOTIC DAILY PO) Take 1 tablet by mouth as needed (for health).      Simethicone (PHAZYME) 180 MG CAPS Take 180-360 mg by mouth daily as needed (for gas).     traMADol (ULTRAM) 50 MG tablet Take 1 tablet (50 mg total) by mouth every 6 (six) hours as needed. 15 tablet 0   zaleplon (SONATA) 10 MG capsule Take 10 mg by mouth at bedtime as needed for sleep.     No current facility-administered medications for this visit.    OBJECTIVE: White woman in no acute distress  Vitals:   05/02/21 1307  BP: 134/70  Pulse: 75  Resp: 18  Temp: (!) 97.5 F (36.4 C)  SpO2: 100%     Body mass index is 25.73 kg/m.   Wt Readings from Last 3 Encounters:  05/02/21 136 lb 3.2 oz (61.8 kg)   02/07/21 139 lb (63 kg)  08/01/20 135 lb (61.2 kg)  ECOG FS:1 GENERAL: Patient is a well appearing female in no acute distress HEENT:  Sclerae anicteric.  Oropharynx clear and moist. No ulcerations or evidence of oropharyngeal candidiasis. Neck is supple.  NODES:  No cervical, supraclavicular, or axillary lymphadenopathy palpated.  BREAST EXAM:  Left breast s/p reduction, no sign of local recurrence no nodules, or masses noted, right breast benign LUNGS:  Clear to auscultation bilaterally.  No wheezes or rhonchi. HEART:  Regular rate and rhythm. No murmur appreciated. ABDOMEN:  Soft, nontender.  Positive, normoactive bowel sounds. No organomegaly palpated. MSK:  No focal spinal tenderness to palpation. Full range of motion bilaterally in the upper extremities. EXTREMITIES:  No peripheral edema.   SKIN:  Clear with no obvious rashes or skin changes. No nail dyscrasia. NEURO:  Nonfocal. Well oriented.  Appropriate affect.    LAB RESULTS:  CMP     Component Value Date/Time   NA 134 (L) 02/10/2021 1417   K 3.4 (L) 02/10/2021 1417   CL 96 (L) 02/10/2021 1417   CO2 27 02/10/2021 1417   GLUCOSE 115 (H) 02/10/2021 1417   BUN 12 02/10/2021 1417   CREATININE 1.01 (H) 02/10/2021 1417   CREATININE 0.86 09/23/2017 1539   CALCIUM 9.3 02/10/2021 1417   PROT 7.5 02/10/2021 1417   ALBUMIN 3.8 02/10/2021 1417   AST 55 (H) 02/10/2021 1417   AST 25 09/23/2017 1539   ALT 70 (H) 02/10/2021 1417   ALT 36 09/23/2017 1539   ALKPHOS 131 (H) 02/10/2021 1417   BILITOT 1.3 (H) 02/10/2021 1417   BILITOT 0.5 09/23/2017 1539   GFRNONAA 59 (L) 02/10/2021 1417   GFRNONAA >60 09/23/2017 1539   GFRAA >60 01/30/2019 0816   GFRAA >60 09/23/2017 1539    No results found for: TOTALPROTELP, ALBUMINELP, A1GS, A2GS, BETS, BETA2SER, GAMS, MSPIKE, SPEI  No results found for: KPAFRELGTCHN, LAMBDASER, KAPLAMBRATIO  Lab Results  Component Value Date   WBC 6.2 05/02/2021   NEUTROABS 2.7 05/02/2021   HGB 13.4  05/02/2021   HCT 38.2 05/02/2021   MCV 89.3 05/02/2021   PLT 254 05/02/2021   No results found for: LABCA2  No components found for: ZOXWRU045  No results for input(s): INR in the last 168 hours.  No results found for: LABCA2  No results found for: WUJ811  No results found for: BJY782  No results found for: NFA213  No results found for: CA2729  No components found  for: HGQUANT  No results found for: CEA1 / No results found for: CEA1   No results found for: AFPTUMOR  No results found for: CHROMOGRNA  No results found for: HGBA, HGBA2QUANT, HGBFQUANT, HGBSQUAN (Hemoglobinopathy evaluation)   No results found for: LDH  No results found for: IRON, TIBC, IRONPCTSAT (Iron and TIBC)  No results found for: FERRITIN  Urinalysis    Component Value Date/Time   COLORURINE AMBER (A) 02/10/2021 1413   APPEARANCEUR HAZY (A) 02/10/2021 1413   LABSPEC 1.024 02/10/2021 1413   PHURINE 5.0 02/10/2021 1413   GLUCOSEU NEGATIVE 02/10/2021 1413   HGBUR SMALL (A) 02/10/2021 1413   BILIRUBINUR NEGATIVE 02/10/2021 1413   KETONESUR 80 (A) 02/10/2021 1413   PROTEINUR NEGATIVE 02/10/2021 1413   NITRITE NEGATIVE 02/10/2021 1413   LEUKOCYTESUR NEGATIVE 02/10/2021 1413    STUDIES: No results found.    ELIGIBLE FOR AVAILABLE RESEARCH PROTOCOL: Decided against the COMET trial   ASSESSMENT: 70 y.o. [place], [place] Woman  (1) s/p left breast upper outer quadrant biopsy 08/27/2017 for ductal carcinoma in situ, grade 2, estrogen and progesterone receptor positive  (2) left lumpectomy with no sentinel lymph node sampling 09/25/2017 confirmed ductal carcinoma in situ, grade 2, measuring 2.1 cm, with negative margins.  (3) patient decided against adjuvant radiation.  (4) patient decided against adjuvant antiestrogens  (5) diffuse papillomatosis   (a) right breast biopsy 09/05/2018 shows an intraductal papilloma   (b) breast MRI 05/19/2019 shows multiple bilateral ducts containing  enhancing intraductal masses  (c) patient decided against surgery   (d) decided against antiestrogens  (e) multicentric biopsies x3 07/25/2020 all show papillomas  (6) intensified screening:  (a) mammography with tomography every April  (b) breast MRI every October   PLAN: "[name]" and I reviewed her condition.  We reviewed that I could not palpate anything in either of her breasts.  I reviewed that pain often times is not a sign of cancer or concern, but can be due to dietary changes, or caffeine intake.  Considering her history of breast papillomas however, it may be prudent to I expedite her mammogram and ultrasounds to further evaluate.  She declined this, since her pain is resolved and her breast exam was benign.  However, I told her that if the pain returns, or if she develops any breast changes, to please let us know and we will move her mammogram and ultrasound up.    Of note, she let me know that she has opted for twice a year mammograms after talking to the radiologist who told her that diagnostic mammograms were almost as good as breast MRI.    Taheera will undergo breast imaging in November, 2022 and has f/u scheduled with Dr. Jana Hakim in December 2022.She knows to call for any other issue that may develop before the next visit with [place].   Total encounter time 30 minutes in face to face visit time, chart review, and documentation of the encounter.    Wilber Bihari, NP 05/02/21 1:26 PM Medical Oncology and Hematology [facility] [place], [place] [address] Tel. [phone]    Fax. [phone]    *Total Encounter Time as defined by the Centers for Medicare and Medicaid Services includes, in addition to the face-to-face time of a patient visit (documented in the note above) non-face-to-face time: obtaining and reviewing outside history, ordering and reviewing medications, tests or procedures, care coordination (communications with other health care  professionals or caregivers) and documentation in the medical record.

## 2021-06-27 ENCOUNTER — Ambulatory Visit
Admission: RE | Admit: 2021-06-27 | Discharge: 2021-06-27 | Disposition: A | Discharge status: Home / Self Care | Payer: Medicare Other | Source: Ambulatory Visit | Attending: Oncology | Admitting: Oncology

## 2021-06-27 ENCOUNTER — Other Ambulatory Visit: Payer: Self-pay | Admitting: Oncology

## 2021-06-27 ENCOUNTER — Other Ambulatory Visit: Payer: Self-pay

## 2021-06-27 DIAGNOSIS — D0512 Intraductal carcinoma in situ of left breast: Secondary | ICD-10-CM

## 2021-06-27 DIAGNOSIS — R928 Other abnormal and inconclusive findings on diagnostic imaging of breast: Secondary | ICD-10-CM

## 2021-06-27 DIAGNOSIS — N6311 Unspecified lump in the right breast, upper outer quadrant: Secondary | ICD-10-CM | POA: Diagnosis not present

## 2021-06-27 DIAGNOSIS — Z170 Estrogen receptor positive status [ER+]: Secondary | ICD-10-CM

## 2021-06-27 DIAGNOSIS — C50412 Malignant neoplasm of upper-outer quadrant of left female breast: Secondary | ICD-10-CM

## 2021-06-27 DIAGNOSIS — Z853 Personal history of malignant neoplasm of breast: Secondary | ICD-10-CM | POA: Diagnosis not present

## 2021-06-27 DIAGNOSIS — R922 Inconclusive mammogram: Secondary | ICD-10-CM | POA: Diagnosis not present

## 2021-06-27 DIAGNOSIS — D241 Benign neoplasm of right breast: Secondary | ICD-10-CM

## 2021-06-27 DIAGNOSIS — N6313 Unspecified lump in the right breast, lower outer quadrant: Secondary | ICD-10-CM | POA: Diagnosis not present

## 2021-06-27 DIAGNOSIS — Z86000 Personal history of in-situ neoplasm of breast: Secondary | ICD-10-CM | POA: Diagnosis not present

## 2021-06-27 DIAGNOSIS — N644 Mastodynia: Secondary | ICD-10-CM | POA: Diagnosis not present

## 2021-07-18 ENCOUNTER — Ambulatory Visit: Payer: PPO | Admitting: Oncology

## 2021-07-18 ENCOUNTER — Inpatient Hospital Stay: Payer: Medicare Other | Attending: Adult Health | Admitting: Oncology

## 2021-07-18 ENCOUNTER — Other Ambulatory Visit: Payer: Self-pay

## 2021-07-18 VITALS — BP 163/83 | HR 82 | Temp 97.7°F | Resp 18 | Ht 61.0 in | Wt 137.9 lb

## 2021-07-18 DIAGNOSIS — Z8616 Personal history of COVID-19: Secondary | ICD-10-CM | POA: Diagnosis not present

## 2021-07-18 DIAGNOSIS — I10 Essential (primary) hypertension: Secondary | ICD-10-CM | POA: Diagnosis not present

## 2021-07-18 DIAGNOSIS — Z79899 Other long term (current) drug therapy: Secondary | ICD-10-CM | POA: Insufficient documentation

## 2021-07-18 DIAGNOSIS — Z170 Estrogen receptor positive status [ER+]: Secondary | ICD-10-CM | POA: Diagnosis not present

## 2021-07-18 DIAGNOSIS — D0512 Intraductal carcinoma in situ of left breast: Secondary | ICD-10-CM | POA: Diagnosis not present

## 2021-07-18 DIAGNOSIS — C50412 Malignant neoplasm of upper-outer quadrant of left female breast: Secondary | ICD-10-CM

## 2021-07-18 DIAGNOSIS — Z86000 Personal history of in-situ neoplasm of breast: Secondary | ICD-10-CM | POA: Insufficient documentation

## 2021-07-18 DIAGNOSIS — E785 Hyperlipidemia, unspecified: Secondary | ICD-10-CM | POA: Insufficient documentation

## 2021-07-18 DIAGNOSIS — R609 Edema, unspecified: Secondary | ICD-10-CM | POA: Diagnosis not present

## 2021-07-18 DIAGNOSIS — Z803 Family history of malignant neoplasm of breast: Secondary | ICD-10-CM | POA: Insufficient documentation

## 2021-07-18 DIAGNOSIS — M8580 Other specified disorders of bone density and structure, unspecified site: Secondary | ICD-10-CM | POA: Insufficient documentation

## 2021-07-18 DIAGNOSIS — E781 Pure hyperglyceridemia: Secondary | ICD-10-CM | POA: Insufficient documentation

## 2021-07-18 NOTE — Progress Notes (Signed)
[place]  [phone] [phone]    ID: [name] DOB: 06-28-1951  MR#: [id number]  QZE#:[id number]  Patient Care Team: [name], MD as PCP - General (Family Medicine) , Virgie Dad, MD as Consulting Physician (Oncology) Coralie Keens, MD as Consulting Physician (General Surgery) Kyung Rudd, MD as Consulting Physician (Radiation Oncology) Alden Hipp, MD as Consulting Physician (Obstetrics and Gynecology) Clarene Essex, MD as Consulting Physician (Gastroenterology) Cristine Polio, MD as Consulting Physician (Plastic Surgery) OTHER MD:   CHIEF COMPLAINT: estrogen receptor positive DCIS; diffuse papillomatosis  CURRENT TREATMENT: Observation   INTERVAL HISTORY: "[name]" returns today for follow up of history of diffuse papillomatosis and prior history of ductal carcinoma in situ. She continues under observation.  Since her last visit with me, she was seen by NP Mendel Ryder for breast pain. She underwent bilateral diagnostic mammography with tomography and bilateral ultrasonography at [facility] on 06/27/2021 showing: breast density category B; no suspicious calcifications, masses, or areas of distortion in either breast; there are 3 right and 2 left breast masses that demonstrate stability.  She is scheduled for follow up left breast ultrasound on 01/02/2022.  However she does not think she wants to have any repeat studies until a year from now.  "If it is not needed I do not want to do it"  Of note, she developed abdominal pain in 01/2021. She underwent CT abdomen/pelvis on 02/07/2021 and on 02/10/2021. This showed pericolonic edema about the proximal sigmoid colon that worsened between scans. She was subsequently referred to Dr. Watt Climes, but I do not have any notes from this visit.   REVIEW OF SYSTEMS:  [name] does tai chi regularly through course she is taking under [employer].  She also walks usually at least  a mile at a time and goes to the [employer], mostly [employer], regularly.  She volunteers at the [employer] twice a week.  She tells me she had COVID within the last 2 weeks but has had no fever for the last 5 days.  She did not retest prior to this visit.  A detailed review of systems today was otherwise stable   COVID 19 VACCINATION STATUS: fully vaccinated [employer]) with booster October 2021   HISTORY OF CURRENT ILLNESS: From the original intake note:  [name] had routine mammography at [facility] under Dr. Alden Hipp on 08/17/2017 that found a possible abnormality in the left breast. She underwent unilateral left diagnostic mammography with tomography at [facility] on 08/26/2017 showing: breast density category B. Grouped calcification in the upper outer quadrant of the left breast at middle depth are suspicious for malignancy.  Accordingly on 08/27/2017 she proceeded to biopsy of the left breast area in question. The pathology from this procedure showed ([id number]): Ductal carcinoma in situ with calcifications. Prognostic indicators significant for: estrogen receptor, 95% positive and progesterone receptor, 90% positive, both with strong staining intensity.   The patient's subsequent history is as detailed below.   PAST MEDICAL HISTORY: Past Medical History:  Diagnosis Date   Abnormal liver function test    Arthritis    knees, hands & hips, back    Breast cancer ([facility]) 2019   Left Breast Cancer   Cancer ([place])    DCIS- L breast   Complication of anesthesia    Depression    post partum, also related to her in her 20's   Dyslipidemia    Esophageal spasm    rare occurence , /w ?  reflux, no treatment or med. thus far   Headache    tension , sporadic    Hypertension    Hypertriglyceridemia    IBS (irritable bowel syndrome)    PONV (postoperative nausea and vomiting)    Sleep disturbance   Yeast allergy, HTN   PAST SURGICAL HISTORY: Past  Surgical History:  Procedure Laterality Date   BREAST BIOPSY Left 08/2017   BREAST BIOPSY Right 09/05/2018   BREAST BIOPSY Left 07/25/2020   BREAST BIOPSY Right 07/25/2020   x2   BREAST LUMPECTOMY Left 09/2017   BREAST LUMPECTOMY WITH RADIOACTIVE SEED LOCALIZATION Left 09/25/2017   Procedure: LEFT BREAST PARTIAL MASTECTOMY WITH RADIOACTIVE SEED LOCALIZATION ERAS PATHWAY;  Surgeon: Coralie Keens, MD;  Location: [facility];  Service: General;  Laterality: Left;   BREAST REDUCTION SURGERY     CARDIOVASCULAR STRESS TEST     [place] ARTHROSCOPY Right 2004   REDUCTION MAMMAPLASTY Bilateral 1997   ROOT CANAL     SHOULDER SURGERY Right    2012- arthroscopy   TONSILLECTOMY    C-section 1984. Right shoulder and right knee surgery.   FAMILY HISTORY Family History  Problem Relation Age of Onset   Cancer Mother    Breast cancer Mother 40   Cancer Father   The patient's mother died at age 71 due to breast cancer, diagnosed in her 64s. The patient's father died at age 16 due to colon cancer. The patient has 1 brother and no sisters. She denies a family history of other breast, ovarian, prostate, or colon cancers.    GYNECOLOGIC HISTORY:  No LMP recorded. Patient is postmenopausal. 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 [name], her husband, is retired from being a Dispensing optician in the Therapist, music. The patient's son, [name] "[address] [place], is a Geophysicist/field seismologist in [place], [place]. The patient has no grandchildren. She belongs to the [employer].    ADVANCED DIRECTIVES: In the absence of any documentation to the contrary, the patient's spouse is their HCPOA.    HEALTH MAINTENANCE: Social History   Tobacco Use    Smoking status: Never   Smokeless tobacco: Never  Substance Use Topics   Alcohol use: No   Drug use: No     Colonoscopy: within 2 years  PAP: 2017  Bone density: more than 2 years ago: osteopenia (2009) under Dr. Theadore Nan    Allergies  Allergen Reactions   Yeast Other (See Comments)    Abdominal pain   Fenofibrate Other (See Comments)    Unknown   Glycerine [Glycerin] Nausea And Vomiting   Keflex [Cephalexin] Nausea And Vomiting   Tetracyclines & Related Nausea And Vomiting    Current Outpatient Medications  Medication Sig Dispense Refill   acetaminophen (TYLENOL) 325 MG tablet Take 650 mg by mouth every 6 (six) hours as needed for moderate pain or headache.     amLODipine (NORVASC) 2.5 MG tablet Take 2.5 mg by mouth daily.     diclofenac sodium (VOLTAREN) 1 % GEL Apply 2 g topically 4 (four) times daily.     Glucosamine-Chondroitin-MSM 500-400-125 MG TABS Take 2 tablets by mouth daily.     ibandronate (BONIVA) 150 MG tablet Take 1 tablet  by mouth every 30 (thirty) days.     irbesartan-hydrochlorothiazide (AVALIDE) 150-12.5 MG tablet Take 1 tablet by mouth daily.     Multiple Vitamin (MULTIVITAMIN WITH MINERALS) TABS tablet Take 1 tablet by mouth daily.      ondansetron (ZOFRAN ODT) 4 MG disintegrating tablet Take 1 tablet (4 mg total) by mouth every 8 (eight) hours as needed for nausea or vomiting. 12 tablet 1   polyethylene glycol (MIRALAX / GLYCOLAX) packet Take 17 g by mouth daily as needed for moderate constipation.      Probiotic Product (PROBIOTIC DAILY PO) Take 1 tablet by mouth as needed (for health).      Simethicone (PHAZYME) 180 MG CAPS Take 180-360 mg by mouth daily as needed (for gas).     traMADol (ULTRAM) 50 MG tablet Take 1 tablet (50 mg total) by mouth every 6 (six) hours as needed. 15 tablet 0   zaleplon (SONATA) 10 MG capsule Take 10 mg by mouth at bedtime as needed for sleep.     No current facility-administered medications for this visit.     OBJECTIVE: White woman who appears well  Vitals:   07/18/21 1525  BP: (!) 163/83  Pulse: 82  Resp: 18  Temp: 97.7 F (36.5 C)  SpO2: 99%     Body mass index is 26.06 kg/m.   Wt Readings from Last 3 Encounters:  07/18/21 137 lb 14.4 oz (62.6 kg)  05/02/21 136 lb 3.2 oz (61.8 kg)  02/07/21 139 lb (63 kg)  ECOG FS:1  Sclerae unicteric, EOMs intact Wearing a mask No cervical or supraclavicular adenopathy Lungs no rales or rhonchi Heart regular rate and rhythm Abd soft, nontender, positive bowel sounds MSK no focal spinal tenderness, no upper extremity lymphedema Neuro: nonfocal, well oriented, appropriate affect Breasts: The right breast is unremarkable.  The left breast is status post prior surgery.  There are no findings of concern.  Both axillae are benign.  LAB RESULTS:  CMP     Component Value Date/Time   NA 138 05/02/2021 1254   K 3.5 05/02/2021 1254   CL 102 05/02/2021 1254   CO2 26 05/02/2021 1254   GLUCOSE 82 05/02/2021 1254   BUN 20 05/02/2021 1254   CREATININE 0.99 05/02/2021 1254   CALCIUM 9.8 05/02/2021 1254   PROT 7.3 05/02/2021 1254   ALBUMIN 4.2 05/02/2021 1254   AST 27 05/02/2021 1254   ALT 37 05/02/2021 1254   ALKPHOS 68 05/02/2021 1254   BILITOT 0.6 05/02/2021 1254   GFRNONAA >60 05/02/2021 1254   GFRAA >60 01/30/2019 0816   GFRAA >60 09/23/2017 1539    No results found for: TOTALPROTELP, ALBUMINELP, A1GS, A2GS, BETS, BETA2SER, GAMS, MSPIKE, SPEI  No results found for: KPAFRELGTCHN, LAMBDASER, KAPLAMBRATIO  Lab Results  Component Value Date   WBC 6.2 05/02/2021   NEUTROABS 2.7 05/02/2021   HGB 13.4 05/02/2021   HCT 38.2 05/02/2021   MCV 89.3 05/02/2021   PLT 254 05/02/2021   No results found for: LABCA2  No components found for: ZYSAYT016  No results for input(s): INR in the last 168 hours.  No results found for: LABCA2  No results found for: WFU932  No results found for: TFT732  No results found for: KGU542  No  results found for: CA2729  No components found for: HGQUANT  No results found for: CEA1 / No results found for: CEA1   No results found for: AFPTUMOR  No results found for: [place]  No results found for: HGBA,  HGBA2QUANT, HGBFQUANT, HGBSQUAN (Hemoglobinopathy evaluation)   No results found for: LDH  No results found for: IRON, TIBC, IRONPCTSAT (Iron and TIBC)  No results found for: FERRITIN  Urinalysis    Component Value Date/Time   COLORURINE AMBER (A) 02/10/2021 1413   APPEARANCEUR HAZY (A) 02/10/2021 1413   LABSPEC 1.024 02/10/2021 1413   PHURINE 5.0 02/10/2021 1413   GLUCOSEU NEGATIVE 02/10/2021 1413   HGBUR SMALL (A) 02/10/2021 1413   BILIRUBINUR NEGATIVE 02/10/2021 1413   KETONESUR 80 (A) 02/10/2021 1413   PROTEINUR NEGATIVE 02/10/2021 1413   NITRITE NEGATIVE 02/10/2021 1413   LEUKOCYTESUR NEGATIVE 02/10/2021 1413    STUDIES: US BREAST LTD UNI LEFT INC AXILLA  Result Date: 06/27/2021 CLINICAL DATA:  70 year old female with history of left breast DCIS in 2019 status post lumpectomy. She is having tenderness at the lumpectomy site intermittently for 2 weeks. Additionally, she had 2 biopsies of the right breast and 1 of the left breast, all demonstrating papillomas. However, the papilloma at 8 o'clock in the right breast also demonstrated focal ADH, and excision was recommended. The patient declined. MRI was recommended to be performed in May of 2022 as follow-up for findings on her MRI from 07/03/2020, however this has not been performed. EXAM: DIGITAL DIAGNOSTIC BILATERAL MAMMOGRAM WITH TOMOSYNTHESIS AND CAD; ULTRASOUND LEFT BREAST LIMITED; ULTRASOUND RIGHT BREAST LIMITED TECHNIQUE: Bilateral digital diagnostic mammography and breast tomosynthesis was performed. The images were evaluated with computer-aided detection.; Targeted ultrasound examination of the left breast was performed.; Targeted ultrasound examination of the right breast was performed COMPARISON:   Previous exam(s). ACR Breast Density Category b: There are scattered areas of fibroglandular density. FINDINGS: No suspicious changes are seen at the biopsy sites in the bilateral breasts, corresponding with the previously biopsied papillomas, including the papilloma with atypia in the lower outer right breast (heart shaped clip). No suspicious changes are seen at the patient's lumpectomy site in the upper-outer left breast. No new suspicious calcifications, masses or areas of distortion are seen in the bilateral breasts. Ultrasound targeted to the right breast at 9 o'clock, 3 cm from the nipple demonstrates an intraductal mass measuring 1.1 x 0.2 cm, previously measuring 1.4 x 0.2 cm. The adjacent oval circumscribed hypoechoic mass at 9:30, 3 cm from the nipple measures 0.4 x 0.1 x 0.3 cm, previously 0.5 x 0.2 x 0.3 cm. At 1 o'clock, 1 cm from the nipple, there is a stable small hypoechoic mass measuring 0.3 x 0.1 x 0.2 cm, previously 0.4 x 0.2 x 0.2 cm. Ultrasound of the left breast at 1 o'clock, 1 cm from the nipple demonstrates a heterogeneous superficial mass measuring 1.1 x 0.6 x 0.9 cm, previously 1.2 x 0.5 x 0.8 cm. Ultrasound of the left breast at 6 o'clock, 4 cm from the nipple demonstrates a stable round hypoechoic mass measuring 0.2 x 0.2 x 0.2 cm, previously 0.3 x 0.2 x 0.3 cm. Ultrasound of the left breast at 2 o'clock, 2 cm from the nipple demonstrates an isoechoic oval mass with an echogenic rim compatible with the benign oil cyst seen at the patient's lumpectomy site. This oil cyst measures 0.7 cm. The patient is experiencing pain extending from the lumpectomy site into the left axilla. Ultrasound of the left axilla demonstrates normal-appearing lymph nodes. No suspicious masses are identified. The initial ultrasound, the patient said she felt a new palpable lump while here in the medial aspect of the left breast. Physical exam of this location demonstrates a firm ridge of tissue without a  discrete  palpable lump. Ultrasound targeted to the left breast at 8:30, 6 cm from the nipple at the palpable site of concern demonstrates normal fibroglandular tissue. No suspicious masses or areas of shadowing are identified. IMPRESSION: 1. The 3 masses in the right breast have demonstrated 2 year of stability. 2. The 2 masses in the left breast, including the mass which was newly identified at 6 o'clock in the left breast on her prior diagnostic workup are stable. 3. There are no suspicious mammographic or targeted sonographic abnormalities in the upper-outer left breast into the left axilla in the region of the patient's pain. 4. No mammographic or targeted sonographic abnormalities are identified at the palpable site in the medial left breast. 5. No suspicious calcifications, masses or areas of distortion are seen in the bilateral breasts. RECOMMENDATION: 1. The patient is overdue for her breast MRI which was recommended for May/June of 2022 which was previously recommended, and it is still suggested due to her multiple bilateral masses seen on the prior MRI, multiple biopsied papillomas and surveillance of the non-excised atypia in the right breast. 2. Left breast ultrasound is recommended in May of 2023 to document 1 year of stability of the left breast mass at 6 o'clock. 3. The remainder of the masses bilaterally have documented 2 years of stability, and no further follow-up is necessary. I have discussed the findings and recommendations with the patient. If applicable, a reminder letter will be sent to the patient regarding the next appointment. BI-RADS CATEGORY  3: Probably benign. Electronically Signed   By: Ammie Ferrier M.D.   On: 06/27/2021 13:34  US BREAST LTD UNI RIGHT INC AXILLA  Result Date: 06/27/2021 CLINICAL DATA:  70 year old female with history of left breast DCIS in 2019 status post lumpectomy. She is having tenderness at the lumpectomy site intermittently for 2 weeks. Additionally, she had 2  biopsies of the right breast and 1 of the left breast, all demonstrating papillomas. However, the papilloma at 8 o'clock in the right breast also demonstrated focal ADH, and excision was recommended. The patient declined. MRI was recommended to be performed in May of 2022 as follow-up for findings on her MRI from 07/03/2020, however this has not been performed. EXAM: DIGITAL DIAGNOSTIC BILATERAL MAMMOGRAM WITH TOMOSYNTHESIS AND CAD; ULTRASOUND LEFT BREAST LIMITED; ULTRASOUND RIGHT BREAST LIMITED TECHNIQUE: Bilateral digital diagnostic mammography and breast tomosynthesis was performed. The images were evaluated with computer-aided detection.; Targeted ultrasound examination of the left breast was performed.; Targeted ultrasound examination of the right breast was performed COMPARISON:  Previous exam(s). ACR Breast Density Category b: There are scattered areas of fibroglandular density. FINDINGS: No suspicious changes are seen at the biopsy sites in the bilateral breasts, corresponding with the previously biopsied papillomas, including the papilloma with atypia in the lower outer right breast (heart shaped clip). No suspicious changes are seen at the patient's lumpectomy site in the upper-outer left breast. No new suspicious calcifications, masses or areas of distortion are seen in the bilateral breasts. Ultrasound targeted to the right breast at 9 o'clock, 3 cm from the nipple demonstrates an intraductal mass measuring 1.1 x 0.2 cm, previously measuring 1.4 x 0.2 cm. The adjacent oval circumscribed hypoechoic mass at 9:30, 3 cm from the nipple measures 0.4 x 0.1 x 0.3 cm, previously 0.5 x 0.2 x 0.3 cm. At 1 o'clock, 1 cm from the nipple, there is a stable small hypoechoic mass measuring 0.3 x 0.1 x 0.2 cm, previously 0.4 x 0.2 x 0.2 cm. Ultrasound  of the left breast at 1 o'clock, 1 cm from the nipple demonstrates a heterogeneous superficial mass measuring 1.1 x 0.6 x 0.9 cm, previously 1.2 x 0.5 x 0.8 cm. Ultrasound  of the left breast at 6 o'clock, 4 cm from the nipple demonstrates a stable round hypoechoic mass measuring 0.2 x 0.2 x 0.2 cm, previously 0.3 x 0.2 x 0.3 cm. Ultrasound of the left breast at 2 o'clock, 2 cm from the nipple demonstrates an isoechoic oval mass with an echogenic rim compatible with the benign oil cyst seen at the patient's lumpectomy site. This oil cyst measures 0.7 cm. The patient is experiencing pain extending from the lumpectomy site into the left axilla. Ultrasound of the left axilla demonstrates normal-appearing lymph nodes. No suspicious masses are identified. The initial ultrasound, the patient said she felt a new palpable lump while here in the medial aspect of the left breast. Physical exam of this location demonstrates a firm ridge of tissue without a discrete palpable lump. Ultrasound targeted to the left breast at 8:30, 6 cm from the nipple at the palpable site of concern demonstrates normal fibroglandular tissue. No suspicious masses or areas of shadowing are identified. IMPRESSION: 1. The 3 masses in the right breast have demonstrated 2 year of stability. 2. The 2 masses in the left breast, including the mass which was newly identified at 6 o'clock in the left breast on her prior diagnostic workup are stable. 3. There are no suspicious mammographic or targeted sonographic abnormalities in the upper-outer left breast into the left axilla in the region of the patient's pain. 4. No mammographic or targeted sonographic abnormalities are identified at the palpable site in the medial left breast. 5. No suspicious calcifications, masses or areas of distortion are seen in the bilateral breasts. RECOMMENDATION: 1. The patient is overdue for her breast MRI which was recommended for May/June of 2022 which was previously recommended, and it is still suggested due to her multiple bilateral masses seen on the prior MRI, multiple biopsied papillomas and surveillance of the non-excised atypia in the  right breast. 2. Left breast ultrasound is recommended in May of 2023 to document 1 year of stability of the left breast mass at 6 o'clock. 3. The remainder of the masses bilaterally have documented 2 years of stability, and no further follow-up is necessary. I have discussed the findings and recommendations with the patient. If applicable, a reminder letter will be sent to the patient regarding the next appointment. BI-RADS CATEGORY  3: Probably benign. Electronically Signed   By: Ammie Ferrier M.D.   On: 06/27/2021 13:34  MM DIAG BREAST TOMO BILATERAL  Result Date: 06/27/2021 CLINICAL DATA:  70 year old female with history of left breast DCIS in 2019 status post lumpectomy. She is having tenderness at the lumpectomy site intermittently for 2 weeks. Additionally, she had 2 biopsies of the right breast and 1 of the left breast, all demonstrating papillomas. However, the papilloma at 8 o'clock in the right breast also demonstrated focal ADH, and excision was recommended. The patient declined. MRI was recommended to be performed in May of 2022 as follow-up for findings on her MRI from 07/03/2020, however this has not been performed. EXAM: DIGITAL DIAGNOSTIC BILATERAL MAMMOGRAM WITH TOMOSYNTHESIS AND CAD; ULTRASOUND LEFT BREAST LIMITED; ULTRASOUND RIGHT BREAST LIMITED TECHNIQUE: Bilateral digital diagnostic mammography and breast tomosynthesis was performed. The images were evaluated with computer-aided detection.; Targeted ultrasound examination of the left breast was performed.; Targeted ultrasound examination of the right breast was performed COMPARISON:  Previous exam(s). ACR Breast Density Category b: There are scattered areas of fibroglandular density. FINDINGS: No suspicious changes are seen at the biopsy sites in the bilateral breasts, corresponding with the previously biopsied papillomas, including the papilloma with atypia in the lower outer right breast (heart shaped clip). No suspicious changes are  seen at the patient's lumpectomy site in the upper-outer left breast. No new suspicious calcifications, masses or areas of distortion are seen in the bilateral breasts. Ultrasound targeted to the right breast at 9 o'clock, 3 cm from the nipple demonstrates an intraductal mass measuring 1.1 x 0.2 cm, previously measuring 1.4 x 0.2 cm. The adjacent oval circumscribed hypoechoic mass at 9:30, 3 cm from the nipple measures 0.4 x 0.1 x 0.3 cm, previously 0.5 x 0.2 x 0.3 cm. At 1 o'clock, 1 cm from the nipple, there is a stable small hypoechoic mass measuring 0.3 x 0.1 x 0.2 cm, previously 0.4 x 0.2 x 0.2 cm. Ultrasound of the left breast at 1 o'clock, 1 cm from the nipple demonstrates a heterogeneous superficial mass measuring 1.1 x 0.6 x 0.9 cm, previously 1.2 x 0.5 x 0.8 cm. Ultrasound of the left breast at 6 o'clock, 4 cm from the nipple demonstrates a stable round hypoechoic mass measuring 0.2 x 0.2 x 0.2 cm, previously 0.3 x 0.2 x 0.3 cm. Ultrasound of the left breast at 2 o'clock, 2 cm from the nipple demonstrates an isoechoic oval mass with an echogenic rim compatible with the benign oil cyst seen at the patient's lumpectomy site. This oil cyst measures 0.7 cm. The patient is experiencing pain extending from the lumpectomy site into the left axilla. Ultrasound of the left axilla demonstrates normal-appearing lymph nodes. No suspicious masses are identified. The initial ultrasound, the patient said she felt a new palpable lump while here in the medial aspect of the left breast. Physical exam of this location demonstrates a firm ridge of tissue without a discrete palpable lump. Ultrasound targeted to the left breast at 8:30, 6 cm from the nipple at the palpable site of concern demonstrates normal fibroglandular tissue. No suspicious masses or areas of shadowing are identified. IMPRESSION: 1. The 3 masses in the right breast have demonstrated 2 year of stability. 2. The 2 masses in the left breast, including the mass  which was newly identified at 6 o'clock in the left breast on her prior diagnostic workup are stable. 3. There are no suspicious mammographic or targeted sonographic abnormalities in the upper-outer left breast into the left axilla in the region of the patient's pain. 4. No mammographic or targeted sonographic abnormalities are identified at the palpable site in the medial left breast. 5. No suspicious calcifications, masses or areas of distortion are seen in the bilateral breasts. RECOMMENDATION: 1. The patient is overdue for her breast MRI which was recommended for May/June of 2022 which was previously recommended, and it is still suggested due to her multiple bilateral masses seen on the prior MRI, multiple biopsied papillomas and surveillance of the non-excised atypia in the right breast. 2. Left breast ultrasound is recommended in May of 2023 to document 1 year of stability of the left breast mass at 6 o'clock. 3. The remainder of the masses bilaterally have documented 2 years of stability, and no further follow-up is necessary. I have discussed the findings and recommendations with the patient. If applicable, a reminder letter will be sent to the patient regarding the next appointment. BI-RADS CATEGORY  3: Probably benign. Electronically Signed   By:  Ammie Ferrier M.D.   On: 06/27/2021 13:34     ELIGIBLE FOR AVAILABLE RESEARCH PROTOCOL: Decided against the COMET trial   ASSESSMENT: 70 y.o. [place], [place] Woman  (1) s/p left breast upper outer quadrant biopsy 08/27/2017 for ductal carcinoma in situ, grade 2, estrogen and progesterone receptor positive  (2) left lumpectomy with no sentinel lymph node sampling 09/25/2017 confirmed ductal carcinoma in situ, grade 2, measuring 2.1 cm, with negative margins.  (3) patient decided against adjuvant radiation.  (4) patient decided against adjuvant antiestrogens  (5) diffuse papillomatosis   (a) right breast biopsy 09/05/2018 shows an intraductal  papilloma   (b) breast MRI 05/19/2019 shows multiple bilateral ducts containing enhancing intraductal masses  (c) patient decided against surgery   (d) decided against antiestrogens  (e) multicentric biopsies x3 07/25/2020 all show papillomas  (6) intensified screening:  (a) mammography with tomography every April  (b) breast MRI every October   PLAN: "[name]" is coming up on 4 years from her ductal carcinoma in situ.  There is no evidence of disease recurrence or activity.  This is favorable.  Because she did not receive radiation or antiestrogens we were doing intensified screening.  She however would like to discontinue this unless it is absolutely necessary.  It is not absolutely necessary since hopefully we will be able to find a recurrent disease or new cancer early--breast density is category B.  If she decides that she does want MRI of course we will be glad to put that in May.  I did not counsel her to follow by annual mammography because of concerns regarding the excessive radiation risk.  I commended her wonderful exercise program.  She will return to see [place] in a year.  She knows to call for any other issue that may develop before then.  Total encounter time 20 minutes.Sarajane Jews C. , MD 07/18/21 5:23 PM Medical Oncology and Hematology [facility] [place], [place] [address] Tel. [phone]    Fax. [phone]   I, Wilburn Mylar, am acting as scribe for Dr. Virgie Dad. .  I, Lurline Del MD, have reviewed the above documentation for accuracy and completeness, and I agree with the above.    *Total Encounter Time as defined by the Centers for Medicare and Medicaid Services includes, in addition to the face-to-face time of a patient visit (documented in the note above) non-face-to-face time: obtaining and reviewing outside history, ordering and reviewing medications, tests or procedures, care coordination (communications  with other health care professionals or caregivers) and documentation in the medical record.

## 2021-08-08 DIAGNOSIS — M9905 Segmental and somatic dysfunction of pelvic region: Secondary | ICD-10-CM | POA: Diagnosis not present

## 2021-08-08 DIAGNOSIS — M5136 Other intervertebral disc degeneration, lumbar region: Secondary | ICD-10-CM | POA: Diagnosis not present

## 2021-08-08 DIAGNOSIS — M9904 Segmental and somatic dysfunction of sacral region: Secondary | ICD-10-CM | POA: Diagnosis not present

## 2021-08-08 DIAGNOSIS — M9903 Segmental and somatic dysfunction of lumbar region: Secondary | ICD-10-CM | POA: Diagnosis not present

## 2021-08-28 ENCOUNTER — Ambulatory Visit: Payer: Medicare Other | Admitting: Orthopaedic Surgery

## 2021-08-28 DIAGNOSIS — H2513 Age-related nuclear cataract, bilateral: Secondary | ICD-10-CM | POA: Diagnosis not present

## 2021-09-28 DIAGNOSIS — M25561 Pain in right knee: Secondary | ICD-10-CM | POA: Diagnosis not present

## 2021-09-28 DIAGNOSIS — G479 Sleep disorder, unspecified: Secondary | ICD-10-CM | POA: Diagnosis not present

## 2021-09-28 DIAGNOSIS — I10 Essential (primary) hypertension: Secondary | ICD-10-CM | POA: Diagnosis not present

## 2021-09-28 DIAGNOSIS — E781 Pure hyperglyceridemia: Secondary | ICD-10-CM | POA: Diagnosis not present

## 2021-09-28 DIAGNOSIS — K219 Gastro-esophageal reflux disease without esophagitis: Secondary | ICD-10-CM | POA: Diagnosis not present

## 2021-09-28 DIAGNOSIS — M85852 Other specified disorders of bone density and structure, left thigh: Secondary | ICD-10-CM | POA: Diagnosis not present

## 2021-09-28 DIAGNOSIS — R7301 Impaired fasting glucose: Secondary | ICD-10-CM | POA: Diagnosis not present

## 2021-09-28 DIAGNOSIS — K5900 Constipation, unspecified: Secondary | ICD-10-CM | POA: Diagnosis not present

## 2021-09-28 DIAGNOSIS — E559 Vitamin D deficiency, unspecified: Secondary | ICD-10-CM | POA: Diagnosis not present

## 2021-10-17 DIAGNOSIS — M9903 Segmental and somatic dysfunction of lumbar region: Secondary | ICD-10-CM | POA: Diagnosis not present

## 2021-10-17 DIAGNOSIS — M9905 Segmental and somatic dysfunction of pelvic region: Secondary | ICD-10-CM | POA: Diagnosis not present

## 2021-10-17 DIAGNOSIS — M5136 Other intervertebral disc degeneration, lumbar region: Secondary | ICD-10-CM | POA: Diagnosis not present

## 2021-10-17 DIAGNOSIS — M9904 Segmental and somatic dysfunction of sacral region: Secondary | ICD-10-CM | POA: Diagnosis not present

## 2021-11-14 DIAGNOSIS — M25561 Pain in right knee: Secondary | ICD-10-CM | POA: Diagnosis not present

## 2021-11-29 DIAGNOSIS — L570 Actinic keratosis: Secondary | ICD-10-CM | POA: Diagnosis not present

## 2021-12-19 DIAGNOSIS — Z0000 Encounter for general adult medical examination without abnormal findings: Secondary | ICD-10-CM | POA: Diagnosis not present

## 2022-01-02 ENCOUNTER — Other Ambulatory Visit: Payer: Medicare Other

## 2022-01-04 ENCOUNTER — Ambulatory Visit
Admission: RE | Admit: 2022-01-04 | Discharge: 2022-01-04 | Disposition: A | Discharge status: Home / Self Care | Payer: Medicare Other | Source: Ambulatory Visit | Attending: Oncology | Admitting: Oncology

## 2022-01-04 DIAGNOSIS — C50412 Malignant neoplasm of upper-outer quadrant of left female breast: Secondary | ICD-10-CM

## 2022-01-04 DIAGNOSIS — R928 Other abnormal and inconclusive findings on diagnostic imaging of breast: Secondary | ICD-10-CM

## 2022-01-04 DIAGNOSIS — D0512 Intraductal carcinoma in situ of left breast: Secondary | ICD-10-CM

## 2022-01-04 DIAGNOSIS — D241 Benign neoplasm of right breast: Secondary | ICD-10-CM

## 2022-01-04 DIAGNOSIS — N6324 Unspecified lump in the left breast, lower inner quadrant: Secondary | ICD-10-CM | POA: Diagnosis not present

## 2022-01-04 DIAGNOSIS — N6323 Unspecified lump in the left breast, lower outer quadrant: Secondary | ICD-10-CM | POA: Diagnosis not present

## 2022-02-22 ENCOUNTER — Telehealth: Payer: Self-pay | Admitting: *Deleted

## 2022-02-22 NOTE — Telephone Encounter (Signed)
Received call from pt regarding recent left breast US results.  Breast US shows no change in left breast 2x2x2 mm mass and request f/u breast MRI. Pt requesting advice from MD if breast MRI is needed considering hx of mass and unchanged appearance.  Pt also states she is asymptomatic at this time.  Per MD, Breast MRI needed if pt does not wish to proceed.  Pt educated to contact our office if symptoms of redness, warmth, pain, or growth occur. Pt verbalized understanding.

## 2022-04-18 DIAGNOSIS — R7301 Impaired fasting glucose: Secondary | ICD-10-CM | POA: Diagnosis not present

## 2022-04-18 DIAGNOSIS — M85852 Other specified disorders of bone density and structure, left thigh: Secondary | ICD-10-CM | POA: Diagnosis not present

## 2022-04-18 DIAGNOSIS — E781 Pure hyperglyceridemia: Secondary | ICD-10-CM | POA: Diagnosis not present

## 2022-04-25 DIAGNOSIS — M85852 Other specified disorders of bone density and structure, left thigh: Secondary | ICD-10-CM | POA: Diagnosis not present

## 2022-04-25 DIAGNOSIS — M25561 Pain in right knee: Secondary | ICD-10-CM | POA: Diagnosis not present

## 2022-04-25 DIAGNOSIS — Z23 Encounter for immunization: Secondary | ICD-10-CM | POA: Diagnosis not present

## 2022-04-25 DIAGNOSIS — I10 Essential (primary) hypertension: Secondary | ICD-10-CM | POA: Diagnosis not present

## 2022-04-25 DIAGNOSIS — M85851 Other specified disorders of bone density and structure, right thigh: Secondary | ICD-10-CM | POA: Diagnosis not present

## 2022-04-25 DIAGNOSIS — J029 Acute pharyngitis, unspecified: Secondary | ICD-10-CM | POA: Diagnosis not present

## 2022-04-25 DIAGNOSIS — K219 Gastro-esophageal reflux disease without esophagitis: Secondary | ICD-10-CM | POA: Diagnosis not present

## 2022-04-25 DIAGNOSIS — G479 Sleep disorder, unspecified: Secondary | ICD-10-CM | POA: Diagnosis not present

## 2022-05-02 DIAGNOSIS — D2262 Melanocytic nevi of left upper limb, including shoulder: Secondary | ICD-10-CM | POA: Diagnosis not present

## 2022-05-02 DIAGNOSIS — D1801 Hemangioma of skin and subcutaneous tissue: Secondary | ICD-10-CM | POA: Diagnosis not present

## 2022-05-02 DIAGNOSIS — L820 Inflamed seborrheic keratosis: Secondary | ICD-10-CM | POA: Diagnosis not present

## 2022-05-02 DIAGNOSIS — L570 Actinic keratosis: Secondary | ICD-10-CM | POA: Diagnosis not present

## 2022-05-02 DIAGNOSIS — D225 Melanocytic nevi of trunk: Secondary | ICD-10-CM | POA: Diagnosis not present

## 2022-05-02 DIAGNOSIS — L821 Other seborrheic keratosis: Secondary | ICD-10-CM | POA: Diagnosis not present

## 2022-05-10 DIAGNOSIS — R051 Acute cough: Secondary | ICD-10-CM | POA: Diagnosis not present

## 2022-05-21 DIAGNOSIS — K219 Gastro-esophageal reflux disease without esophagitis: Secondary | ICD-10-CM | POA: Diagnosis not present

## 2022-05-21 DIAGNOSIS — J209 Acute bronchitis, unspecified: Secondary | ICD-10-CM | POA: Diagnosis not present

## 2022-05-25 ENCOUNTER — Encounter (HOSPITAL_BASED_OUTPATIENT_CLINIC_OR_DEPARTMENT_OTHER): Payer: Self-pay | Admitting: Emergency Medicine

## 2022-05-25 ENCOUNTER — Encounter (HOSPITAL_COMMUNITY): Payer: Self-pay

## 2022-05-25 ENCOUNTER — Other Ambulatory Visit: Payer: Self-pay

## 2022-05-25 ENCOUNTER — Emergency Department (HOSPITAL_BASED_OUTPATIENT_CLINIC_OR_DEPARTMENT_OTHER): Payer: Medicare Other

## 2022-05-25 ENCOUNTER — Inpatient Hospital Stay (HOSPITAL_BASED_OUTPATIENT_CLINIC_OR_DEPARTMENT_OTHER)
Admission: EM | Admit: 2022-05-25 | Discharge: 2022-05-31 | DRG: 392 | Disposition: A | Discharge status: Home / Self Care | Payer: Medicare Other | Attending: Internal Medicine | Admitting: Internal Medicine

## 2022-05-25 DIAGNOSIS — Z79899 Other long term (current) drug therapy: Secondary | ICD-10-CM | POA: Diagnosis not present

## 2022-05-25 DIAGNOSIS — Z170 Estrogen receptor positive status [ER+]: Secondary | ICD-10-CM

## 2022-05-25 DIAGNOSIS — K5792 Diverticulitis of intestine, part unspecified, without perforation or abscess without bleeding: Secondary | ICD-10-CM | POA: Diagnosis not present

## 2022-05-25 DIAGNOSIS — E781 Pure hyperglyceridemia: Secondary | ICD-10-CM | POA: Diagnosis present

## 2022-05-25 DIAGNOSIS — R932 Abnormal findings on diagnostic imaging of liver and biliary tract: Secondary | ICD-10-CM | POA: Insufficient documentation

## 2022-05-25 DIAGNOSIS — K5900 Constipation, unspecified: Secondary | ICD-10-CM | POA: Diagnosis not present

## 2022-05-25 DIAGNOSIS — K589 Irritable bowel syndrome without diarrhea: Secondary | ICD-10-CM | POA: Diagnosis not present

## 2022-05-25 DIAGNOSIS — K567 Ileus, unspecified: Secondary | ICD-10-CM | POA: Diagnosis not present

## 2022-05-25 DIAGNOSIS — C50412 Malignant neoplasm of upper-outer quadrant of left female breast: Secondary | ICD-10-CM

## 2022-05-25 DIAGNOSIS — I10 Essential (primary) hypertension: Secondary | ICD-10-CM | POA: Diagnosis not present

## 2022-05-25 DIAGNOSIS — R188 Other ascites: Secondary | ICD-10-CM | POA: Diagnosis not present

## 2022-05-25 DIAGNOSIS — Z803 Family history of malignant neoplasm of breast: Secondary | ICD-10-CM

## 2022-05-25 DIAGNOSIS — Z7983 Long term (current) use of bisphosphonates: Secondary | ICD-10-CM

## 2022-05-25 DIAGNOSIS — K5732 Diverticulitis of large intestine without perforation or abscess without bleeding: Secondary | ICD-10-CM | POA: Diagnosis not present

## 2022-05-25 DIAGNOSIS — K3189 Other diseases of stomach and duodenum: Secondary | ICD-10-CM | POA: Diagnosis not present

## 2022-05-25 DIAGNOSIS — Z888 Allergy status to other drugs, medicaments and biological substances status: Secondary | ICD-10-CM | POA: Diagnosis not present

## 2022-05-25 DIAGNOSIS — E876 Hypokalemia: Secondary | ICD-10-CM | POA: Diagnosis not present

## 2022-05-25 DIAGNOSIS — G4700 Insomnia, unspecified: Secondary | ICD-10-CM | POA: Diagnosis present

## 2022-05-25 DIAGNOSIS — Z881 Allergy status to other antibiotic agents status: Secondary | ICD-10-CM | POA: Diagnosis not present

## 2022-05-25 DIAGNOSIS — Z85038 Personal history of other malignant neoplasm of large intestine: Secondary | ICD-10-CM

## 2022-05-25 DIAGNOSIS — R112 Nausea with vomiting, unspecified: Secondary | ICD-10-CM | POA: Diagnosis not present

## 2022-05-25 DIAGNOSIS — N200 Calculus of kidney: Secondary | ICD-10-CM | POA: Diagnosis not present

## 2022-05-25 DIAGNOSIS — M159 Polyosteoarthritis, unspecified: Secondary | ICD-10-CM | POA: Diagnosis present

## 2022-05-25 DIAGNOSIS — R1032 Left lower quadrant pain: Secondary | ICD-10-CM | POA: Diagnosis not present

## 2022-05-25 DIAGNOSIS — K6389 Other specified diseases of intestine: Secondary | ICD-10-CM | POA: Diagnosis not present

## 2022-05-25 DIAGNOSIS — N3289 Other specified disorders of bladder: Secondary | ICD-10-CM | POA: Diagnosis not present

## 2022-05-25 DIAGNOSIS — Z853 Personal history of malignant neoplasm of breast: Secondary | ICD-10-CM

## 2022-05-25 LAB — CBC
HCT: 45.7 % (ref 36.0–46.0)
Hemoglobin: 16.4 g/dL — ABNORMAL HIGH (ref 12.0–15.0)
MCH: 31.5 pg (ref 26.0–34.0)
MCHC: 35.9 g/dL (ref 30.0–36.0)
MCV: 87.7 fL (ref 80.0–100.0)
Platelets: 374 10*3/uL (ref 150–400)
RBC: 5.21 MIL/uL — ABNORMAL HIGH (ref 3.87–5.11)
RDW: 12.5 % (ref 11.5–15.5)
WBC: 18 10*3/uL — ABNORMAL HIGH (ref 4.0–10.5)
nRBC: 0 % (ref 0.0–0.2)

## 2022-05-25 LAB — COMPREHENSIVE METABOLIC PANEL
ALT: 34 U/L (ref 0–44)
AST: 27 U/L (ref 15–41)
Albumin: 4.3 g/dL (ref 3.5–5.0)
Alkaline Phosphatase: 85 U/L (ref 38–126)
Anion gap: 15 (ref 5–15)
BUN: 16 mg/dL (ref 8–23)
CO2: 21 mmol/L — ABNORMAL LOW (ref 22–32)
Calcium: 9.6 mg/dL (ref 8.9–10.3)
Chloride: 96 mmol/L — ABNORMAL LOW (ref 98–111)
Creatinine, Ser: 0.82 mg/dL (ref 0.44–1.00)
GFR, Estimated: >60 mL/min (ref >60)
Glucose, Bld: 155 mg/dL — ABNORMAL HIGH (ref 70–99)
Potassium: 3.5 mmol/L (ref 3.5–5.1)
Sodium: 132 mmol/L — ABNORMAL LOW (ref 135–145)
Total Bilirubin: 1 mg/dL (ref 0.3–1.2)
Total Protein: 7.6 g/dL (ref 6.5–8.1)

## 2022-05-25 LAB — URINALYSIS, ROUTINE W REFLEX MICROSCOPIC
Bilirubin Urine: NEGATIVE
Glucose, UA: NEGATIVE mg/dL
Ketones, ur: >80 mg/dL — AB
Leukocytes,Ua: NEGATIVE
Nitrite: NEGATIVE
Protein, ur: 30 mg/dL — AB
Specific Gravity, Urine: 1.037 — ABNORMAL HIGH (ref 1.005–1.030)
pH: 5.5 (ref 5.0–8.0)

## 2022-05-25 LAB — LIPASE, BLOOD: Lipase: 14 U/L (ref 11–51)

## 2022-05-25 MED ORDER — LACTATED RINGERS IV BOLUS
1000.0000 mL | Freq: Once | INTRAVENOUS | Status: AC
  Administered 2022-05-25: 1000 mL via INTRAVENOUS

## 2022-05-25 MED ORDER — SODIUM CHLORIDE 0.9 % IV SOLN
12.5000 mg | Freq: Once | INTRAVENOUS | Status: AC
  Administered 2022-05-25: 12.5 mg via INTRAVENOUS
  Filled 2022-05-25: qty 0.5
  Filled 2022-05-25: qty 12.5

## 2022-05-25 MED ORDER — METRONIDAZOLE 500 MG/100ML IV SOLN
500.0000 mg | Freq: Two times a day (BID) | INTRAVENOUS | Status: DC
  Administered 2022-05-25 – 2022-05-29 (×8): 500 mg via INTRAVENOUS
  Filled 2022-05-25 (×8): qty 100

## 2022-05-25 MED ORDER — FENTANYL CITRATE PF 50 MCG/ML IJ SOSY: 50.0000 ug | PREFILLED_SYRINGE | Freq: Once | INTRAMUSCULAR | Status: DC | PRN

## 2022-05-25 MED ORDER — OXYCODONE-ACETAMINOPHEN 5-325 MG PO TABS
1.0000 | ORAL_TABLET | Freq: Once | ORAL | Status: AC
  Administered 2022-05-25: 1 via ORAL
  Filled 2022-05-25: qty 1

## 2022-05-25 MED ORDER — ENOXAPARIN SODIUM 40 MG/0.4ML IJ SOSY
40.0000 mg | PREFILLED_SYRINGE | INTRAMUSCULAR | Status: DC
  Administered 2022-05-25 – 2022-05-29 (×4): 40 mg via SUBCUTANEOUS
  Filled 2022-05-25 (×6): qty 0.4

## 2022-05-25 MED ORDER — CIPROFLOXACIN IN D5W 400 MG/200ML IV SOLN
400.0000 mg | Freq: Two times a day (BID) | INTRAVENOUS | Status: DC
  Administered 2022-05-25 – 2022-05-29 (×8): 400 mg via INTRAVENOUS
  Filled 2022-05-25 (×8): qty 200

## 2022-05-25 MED ORDER — METRONIDAZOLE 500 MG/100ML IV SOLN
500.0000 mg | Freq: Once | INTRAVENOUS | Status: AC
  Administered 2022-05-25: 500 mg via INTRAVENOUS
  Filled 2022-05-25: qty 100

## 2022-05-25 MED ORDER — FENTANYL CITRATE PF 50 MCG/ML IJ SOSY
50.0000 ug | PREFILLED_SYRINGE | Freq: Once | INTRAMUSCULAR | Status: AC
  Administered 2022-05-25: 50 ug via INTRAVENOUS
  Filled 2022-05-25: qty 1

## 2022-05-25 MED ORDER — OXYCODONE-ACETAMINOPHEN 5-325 MG PO TABS
1.0000 | ORAL_TABLET | ORAL | Status: DC | PRN
  Administered 2022-05-25 – 2022-05-29 (×13): 1 via ORAL
  Filled 2022-05-25 (×16): qty 1

## 2022-05-25 MED ORDER — METOCLOPRAMIDE HCL 5 MG/ML IJ SOLN
10.0000 mg | Freq: Once | INTRAMUSCULAR | Status: AC | PRN
  Administered 2022-05-25: 10 mg via INTRAVENOUS
  Filled 2022-05-25: qty 2

## 2022-05-25 MED ORDER — SODIUM CHLORIDE 0.9 % IV SOLN: INTRAVENOUS | Status: DC

## 2022-05-25 MED ORDER — LIP MEDEX EX OINT
TOPICAL_OINTMENT | CUTANEOUS | Status: DC | PRN
  Filled 2022-05-25: qty 7

## 2022-05-25 MED ORDER — MORPHINE SULFATE (PF) 2 MG/ML IV SOLN
2.0000 mg | Freq: Once | INTRAVENOUS | Status: AC
  Administered 2022-05-25: 2 mg via INTRAVENOUS
  Filled 2022-05-25: qty 1

## 2022-05-25 MED ORDER — ONDANSETRON HCL 4 MG/2ML IJ SOLN
4.0000 mg | Freq: Once | INTRAMUSCULAR | Status: AC
  Administered 2022-05-25: 4 mg via INTRAVENOUS
  Filled 2022-05-25: qty 2

## 2022-05-25 MED ORDER — LACTATED RINGERS BOLUS PEDS: 1000.0000 mL | Freq: Once | INTRAVENOUS | Status: DC

## 2022-05-25 MED ORDER — AMLODIPINE BESYLATE 5 MG PO TABS
2.5000 mg | ORAL_TABLET | Freq: Every day | ORAL | Status: DC
  Administered 2022-05-25 – 2022-05-31 (×7): 2.5 mg via ORAL
  Filled 2022-05-25 (×7): qty 1

## 2022-05-25 MED ORDER — HYDROMORPHONE HCL 1 MG/ML IJ SOLN
0.5000 mg | INTRAMUSCULAR | Status: DC | PRN
  Administered 2022-05-28: 0.5 mg via INTRAVENOUS
  Filled 2022-05-25 (×2): qty 0.5

## 2022-05-25 MED ORDER — ACETAMINOPHEN 325 MG PO TABS
650.0000 mg | ORAL_TABLET | Freq: Four times a day (QID) | ORAL | Status: DC | PRN
  Administered 2022-05-27 – 2022-05-30 (×6): 650 mg via ORAL
  Filled 2022-05-25 (×6): qty 2

## 2022-05-25 MED ORDER — ONDANSETRON HCL 4 MG/2ML IJ SOLN
4.0000 mg | Freq: Four times a day (QID) | INTRAMUSCULAR | Status: DC | PRN
  Administered 2022-05-25 – 2022-05-27 (×2): 4 mg via INTRAVENOUS
  Filled 2022-05-25 (×2): qty 2

## 2022-05-25 MED ORDER — CIPROFLOXACIN IN D5W 400 MG/200ML IV SOLN
400.0000 mg | Freq: Once | INTRAVENOUS | Status: AC
  Administered 2022-05-25: 400 mg via INTRAVENOUS
  Filled 2022-05-25: qty 200

## 2022-05-25 MED ORDER — SODIUM CHLORIDE 0.9 % IV BOLUS
1000.0000 mL | Freq: Once | INTRAVENOUS | Status: AC
  Administered 2022-05-25: 1000 mL via INTRAVENOUS

## 2022-05-25 MED ORDER — ACETAMINOPHEN 650 MG RE SUPP: 650.0000 mg | Freq: Four times a day (QID) | RECTAL | Status: DC | PRN

## 2022-05-25 MED ORDER — ZOLPIDEM TARTRATE 5 MG PO TABS
5.0000 mg | ORAL_TABLET | Freq: Every evening | ORAL | Status: DC | PRN
  Administered 2022-05-30 – 2022-05-31 (×2): 5 mg via ORAL
  Filled 2022-05-25 (×2): qty 1

## 2022-05-25 MED ORDER — ONDANSETRON HCL 4 MG/2ML IJ SOLN
4.0000 mg | Freq: Once | INTRAMUSCULAR | Status: AC | PRN
  Administered 2022-05-25: 4 mg via INTRAVENOUS
  Filled 2022-05-25: qty 2

## 2022-05-25 MED ORDER — HYDRALAZINE HCL 20 MG/ML IJ SOLN: 10.0000 mg | INTRAMUSCULAR | Status: DC | PRN

## 2022-05-25 MED ORDER — IOHEXOL 300 MG/ML  SOLN
100.0000 mL | Freq: Once | INTRAMUSCULAR | Status: AC | PRN
  Administered 2022-05-25: 80 mL via INTRAVENOUS

## 2022-05-25 NOTE — ED Notes (Signed)
[facility] called to tx to [facility]

## 2022-05-25 NOTE — ED Provider Notes (Signed)
[facility] Provider Note: Georgena Spurling, MD, FACEP  CSN: [id number] MRN: [id number] ARRIVAL: 05/25/22 at [place]: [id number]   CHIEF COMPLAINT  Abdominal Pain   HISTORY OF PRESENT ILLNESS  05/25/22 6:00 AM [name] is a 71 y.o. female with 3 days of abdominal pain (generalized but most prominent in the left lower quadrant), nausea, retching and constipation.  Her pain comes in waves and has not been tolerating oral intake well.  She has tried Aleve, Tylenol, "IBgard" and probiotics without relief.  She completed a 10-day course of levofloxacin (for pneumonia) 6 days ago.  She does have a history of diverticulitis and states this feels similar but worse.  She rates her pain currently as a 9 out of 10.  It is worse with movement or palpation.  She was noted to be tachycardic on arrival and continues to be tachycardic.  She was given Zofran in triage with relief of her nausea but it has returned.   Past Medical History:  Diagnosis Date   Abnormal liver function test    Arthritis    knees, hands & hips, back    Breast cancer ([place]) 2019   Left Breast Cancer   Cancer ([place])    DCIS- L breast   Complication of anesthesia    Depression    post partum, also related to her in her 20's   Dyslipidemia    Esophageal spasm    rare occurence , /w ? reflux, no treatment or med. thus far   Headache    tension , sporadic    Hypertension    Hypertriglyceridemia    IBS (irritable bowel syndrome)    PONV (postoperative nausea and vomiting)    Sleep disturbance     Past Surgical History:  Procedure Laterality Date   BREAST BIOPSY Left 08/2017   BREAST BIOPSY Right 09/05/2018   BREAST BIOPSY Left 07/25/2020   BREAST BIOPSY Right 07/25/2020   x2   BREAST LUMPECTOMY Left 09/2017   BREAST LUMPECTOMY WITH RADIOACTIVE SEED LOCALIZATION Left 09/25/2017   Procedure: LEFT BREAST PARTIAL MASTECTOMY WITH RADIOACTIVE SEED LOCALIZATION ERAS PATHWAY;  Surgeon: Coralie Keens, MD;  Location: [facility];  Service: General;  Laterality: Left;   BREAST REDUCTION SURGERY     CARDIOVASCULAR STRESS TEST     CESAREAN SECTION  1984   KNEE ARTHROSCOPY Right 2004   REDUCTION MAMMAPLASTY Bilateral 1997   ROOT CANAL     SHOULDER SURGERY Right    2012- arthroscopy   TONSILLECTOMY      Family History  Problem Relation Age of Onset   Cancer Mother    Breast cancer Mother 46   Cancer Father     Social History   Tobacco Use   Smoking status: Never   Smokeless tobacco: Never  Substance Use Topics   Alcohol use: No   Drug use: No    Prior to Admission medications   Medication Sig Start Date End Date Taking? Authorizing Provider  acetaminophen (TYLENOL) 325 MG tablet Take 650 mg by mouth every 6 (six) hours as needed for moderate pain or headache.    [provider]  amLODipine (NORVASC) 2.5 MG tablet Take 2.5 mg by mouth daily. 04/13/21   [provider]  diclofenac sodium (VOLTAREN) 1 % GEL Apply 2 g topically 4 (four) times daily.    [provider]  Glucosamine-Chondroitin-MSM [phone] MG TABS Take 2 tablets by mouth daily.    [provider]  ibandronate (BONIVA) 150 MG  tablet Take 1 tablet by mouth every 30 (thirty) days. 02/06/21   [provider]  irbesartan-hydrochlorothiazide (AVALIDE) 150-12.5 MG tablet Take 1 tablet by mouth daily. 12/21/18   [provider]  Multiple Vitamin (MULTIVITAMIN WITH MINERALS) TABS tablet Take 1 tablet by mouth daily.     [provider]  ondansetron (ZOFRAN ODT) 4 MG disintegrating tablet Take 1 tablet (4 mg total) by mouth every 8 (eight) hours as needed for nausea or vomiting. 02/08/21   Fredia Sorrow, MD  polyethylene glycol (MIRALAX / GLYCOLAX) packet Take 17 g by mouth daily as needed for moderate constipation.     [provider]  Probiotic Product (PROBIOTIC DAILY PO) Take 1 tablet by mouth as needed (for health).     [provider]   Simethicone (PHAZYME) 180 MG CAPS Take 180-360 mg by mouth daily as needed (for gas).    [provider]  traMADol (ULTRAM) 50 MG tablet Take 1 tablet (50 mg total) by mouth every 6 (six) hours as needed. 02/08/21   Fredia Sorrow, MD  zaleplon (SONATA) 10 MG capsule Take 10 mg by mouth at bedtime as needed for sleep.    [provider]    Allergies Yeast, Fenofibrate, Glycerine [glycerin], Keflex [cephalexin], and Tetracyclines & related   REVIEW OF SYSTEMS  Negative except as noted here or in the History of Present Illness.   PHYSICAL EXAMINATION  Initial Vital Signs Blood pressure (!) 163/89, pulse (!) 111, temperature 98.4 F (36.9 C), temperature source Oral, resp. rate 20, weight 60.8 kg, SpO2 97 %.  Examination General: Well-developed, well-nourished female in no acute distress; appearance consistent with age of record HENT: normocephalic; atraumatic Eyes: Normal appearance Neck: supple Heart: regular rate and rhythm; holosystolic murmur at the right upper sternal border; tachycardia Lungs: clear to auscultation bilaterally Abdomen: soft; distended; left lower quadrant tenderness Extremities: No deformity; full range of motion; pulses normal Neurologic: Awake, alert and oriented; motor function intact in all extremities and symmetric; no facial droop Skin: Warm and dry Psychiatric: Grimacing   RESULTS  Summary of this visit's results, reviewed and interpreted by myself:   EKG Interpretation  Date/Time:    Ventricular Rate:    PR Interval:    QRS Duration:   QT Interval:    QTC Calculation:   R Axis:     Text Interpretation:         Laboratory Studies: Results for orders placed or performed during the hospital encounter of 05/25/22 (from the past 24 hour(s))  Lipase, blood     Status: None   Collection Time: 05/25/22  2:29 AM  Result Value Ref Range   Lipase 14 11 - 51 U/L  Comprehensive metabolic panel     Status: Abnormal    Collection Time: 05/25/22  2:29 AM  Result Value Ref Range   Sodium 132 (L) 135 - 145 mmol/L   Potassium 3.5 3.5 - 5.1 mmol/L   Chloride 96 (L) 98 - 111 mmol/L   CO2 21 (L) 22 - 32 mmol/L   Glucose, Bld 155 (H) 70 - 99 mg/dL   BUN 16 8 - 23 mg/dL   Creatinine, Ser 0.82 0.44 - 1.00 mg/dL   Calcium 9.6 8.9 - 10.3 mg/dL   Total Protein 7.6 6.5 - 8.1 g/dL   Albumin 4.3 3.5 - 5.0 g/dL   AST 27 15 - 41 U/L   ALT 34 0 - 44 U/L   Alkaline Phosphatase 85 38 - 126 U/L  Total Bilirubin 1.0 0.3 - 1.2 mg/dL   GFR, Estimated >60 >60 mL/min   Anion gap 15 5 - 15  CBC     Status: Abnormal   Collection Time: 05/25/22  2:29 AM  Result Value Ref Range   WBC 18.0 (H) 4.0 - 10.5 K/uL   RBC 5.21 (H) 3.87 - 5.11 MIL/uL   Hemoglobin 16.4 (H) 12.0 - 15.0 g/dL   HCT 45.7 36.0 - 46.0 %   MCV 87.7 80.0 - 100.0 fL   MCH 31.5 26.0 - 34.0 pg   MCHC 35.9 30.0 - 36.0 g/dL   RDW 12.5 11.5 - 15.5 %   Platelets 374 150 - 400 K/uL   nRBC 0.0 0.0 - 0.2 %  Urinalysis, Routine w reflex microscopic     Status: Abnormal   Collection Time: 05/25/22  5:30 AM  Result Value Ref Range   Color, Urine YELLOW YELLOW   APPearance CLEAR CLEAR   Specific Gravity, Urine 1.037 (H) 1.005 - 1.030   pH 5.5 5.0 - 8.0   Glucose, UA NEGATIVE NEGATIVE mg/dL   Hgb urine dipstick TRACE (A) NEGATIVE   Bilirubin Urine NEGATIVE NEGATIVE   Ketones, ur >80 (A) NEGATIVE mg/dL   Protein, ur 30 (A) NEGATIVE mg/dL   Nitrite NEGATIVE NEGATIVE   Leukocytes,Ua NEGATIVE NEGATIVE   RBC / HPF 6-10 0 - 5 RBC/hpf   WBC, UA 0-5 0 - 5 WBC/hpf   Bacteria, UA RARE (A) NONE SEEN   Squamous Epithelial / LPF 0-5 0 - 5   Mucus PRESENT    Imaging Studies: CT ABDOMEN PELVIS W CONTRAST  Result Date: 05/25/2022 CLINICAL DATA:  71 year old female with history of left lower quadrant abdominal pain for 1 week. EXAM: CT ABDOMEN AND PELVIS WITH CONTRAST TECHNIQUE: Multidetector CT imaging of the abdomen and pelvis was performed using the standard protocol  following bolus administration of intravenous contrast. RADIATION DOSE REDUCTION: This exam was performed according to the departmental dose-optimization program which includes automated exposure control, adjustment of the mA and/or kV according to patient size and/or use of iterative reconstruction technique. CONTRAST:  71m OMNIPAQUE IOHEXOL 300 MG/ML  SOLN COMPARISON:  CT of the abdomen and pelvis 02/10/2021. FINDINGS: Lower chest: Mild scarring in the right middle lobe. Moderate-sized hiatal hernia. Hepatobiliary: No suspicious cystic or solid hepatic lesions. No intra or extrahepatic biliary ductal dilatation. Along the anterior aspect of the gallbladder in the region of the fundus (axial image 26 of series 2 and sagittal image 37 of series 6) there is a high density structure measuring 1.0 x 1.2 x 0.6 cm, concerning for enhancing soft tissue although this could represent an adherent partially calcified gallstone. No gallbladder wall thickening otherwise noted. No pericholecystic fluid or surrounding inflammatory changes. Pancreas: No pancreatic mass. No pancreatic ductal dilatation. No pancreatic or peripancreatic fluid collections or inflammatory changes. Spleen: Unremarkable. Adrenals/Urinary Tract: 2 mm nonobstructive calculus in the lower pole collecting system of the left kidney. Bilateral kidneys and bilateral adrenal glands are otherwise normal in appearance. No hydroureteronephrosis. Urinary bladder is unremarkable in appearance. Stomach/Bowel: Intra-abdominal portion of the stomach is unremarkable. No pathologic dilatation of small bowel or colon. Numerous colonic diverticuli are noted. In the region of the sigmoid colon there are inflammatory changes in the mesocolon, concerning for potential acute diverticulitis. No discrete diverticular abscess or signs of frank perforation or confidently identified at this time. Normal appendix. Vascular/Lymphatic: Atherosclerotic calcifications in the abdominal  aorta and pelvic vasculature, without evidence of aneurysm or dissection.  No lymphadenopathy noted in the abdomen or pelvis. Reproductive: Uterus and ovaries are unremarkable in appearance. Other: Trace volume of free fluid, most evident in the low anatomic pelvis. No pneumoperitoneum. Musculoskeletal: There are no aggressive appearing lytic or blastic lesions noted in the visualized portions of the skeleton. IMPRESSION: 1. Findings are concerning for acute sigmoid diverticulitis. No diverticular abscess or signs of frank perforation are noted at this time. 2. Indeterminate lesion associated with the fundus of the gallbladder suspicious for small soft tissue neoplasm, although this could alternatively represent an adherent gallstone. Further evaluation with follow-up nonemergent outpatient abdominal MRI with and without IV gadolinium with MRCP should be considered after resolution of the patient's acute illness. 3. 2 mm nonobstructive calculus in the lower pole collecting system of the left kidney. 4. Aortic atherosclerosis. 5. Moderate-sized hiatal hernia. Electronically Signed   By: Vinnie Langton M.D.   On: 05/25/2022 06:57    ED COURSE and MDM  Nursing notes, initial and subsequent vitals signs, including pulse oximetry, reviewed and interpreted by myself.  Vitals:   05/25/22 0217 05/25/22 0543  BP: (!) 136/97 (!) 163/89  Pulse: (!) 110 (!) 111  Resp: (!) 22 20  Temp: 97.6 F (36.4 C) 98.4 F (36.9 C)  TempSrc: Oral Oral  SpO2: 99% 97%  Weight: 60.8 kg    Medications  fentaNYL (SUBLIMAZE) injection 50 mcg (has no administration in time range)  metoCLOPramide (REGLAN) injection 10 mg (has no administration in time range)  ciprofloxacin (CIPRO) IVPB 400 mg (has no administration in time range)  metroNIDAZOLE (FLAGYL) IVPB 500 mg (has no administration in time range)  ondansetron (ZOFRAN) injection 4 mg (4 mg Intravenous Given 05/25/22 0227)  ondansetron (ZOFRAN) injection 4 mg (4 mg  Intravenous Given 05/25/22 0617)  fentaNYL (SUBLIMAZE) injection 50 mcg (50 mcg Intravenous Given 05/25/22 0619)  sodium chloride 0.9 % bolus 1,000 mL (1,000 mLs Intravenous New Bag/Given 05/25/22 0616)  iohexol (OMNIPAQUE) 300 MG/ML solution 100 mL (80 mLs Intravenous Contrast Given 05/25/22 0641)   7:06 AM Patient has acute diverticulitis on CT scan which is consistent with my clinical suspicion given the patient's history of diverticulitis.  She is being rehydrated at this time as her urinalysis showed a high specific gravity consistent with dehydration.  Patient signed out to Dr. Oswald Hillock.  We will start Cipro and Flagyl.   PROCEDURES  Procedures   ED DIAGNOSES     ICD-10-CM   1. Acute diverticulitis  K57.92          Shanon Rosser, MD 05/25/22 [phone]

## 2022-05-25 NOTE — ED Triage Notes (Signed)
Pt presents from home for abd pain (LLQ worst but also generally tender), nausea, dry heaves, constipation x3 days. Comes in waves, not tolerating PO intake well. Has tried aleve, tylenol, IB guard, probiotics.   Has recently been on abx for PNA. Also has a h/o diverticulitis.

## 2022-05-25 NOTE — ED Provider Notes (Signed)
I evaluated this patient at bedside.  She has diverticulitis, intractable pain and nausea.  Shared medical decision making with patient.  She would like to be admitted given her ongoing symptoms.  Patient arranged for admission with no further acute indication for intervention at this time   Tretha Sciara, MD 05/25/22 1506

## 2022-05-25 NOTE — Progress Notes (Signed)
Plan of Care Note for accepted transfer   Patient: [name] MRN: [id number]   DOA: 05/25/2022  Facility requesting transfer: [facility] Requesting Provider: Dr. Oswald Hillock Reason for transfer: acute diverticulitis Facility course: 71 yo F with PMHx of HTN, breast CA. Presenting with abdominal pain, decreased PO intake. W/u revealed acute diverticulitis. She was started on cipro and flagyl.   Plan of care: The patient is accepted for admission to [place]  unit, at [facility]..  While holding at [facility], medical decision making responsibilities remain with the EDP. Upon arrival to [facility], [facility] will assume care. Thank you.  Author: Jonnie Finner, DO 05/25/2022  Check [email or web address] for on-call coverage.  Nursing staff, Please call [place] number on Amion as soon as patient's arrival, so appropriate admitting provider can evaluate the pt.

## 2022-05-25 NOTE — Plan of Care (Signed)

## 2022-05-25 NOTE — H&P (Signed)
History and Physical    [name] WUJ:[id number] DOB: 1951/06/10 DOA: 05/25/2022  PCP: [name], MD  Patient coming from: Home  I have personally briefly reviewed patient's old medical records available.   Chief Complaint: Abdominal pain for 3 days  HPI: [name] is a 71 y.o. female with medical history significant of sigmoid diverticulosis and previous history of diverticulitis, history of breast cancer under surveillance, hypertension and insomnia presents to the emergency room with 3 days of gradual onset abdominal pain, generalized but more so on left lower quadrant, 10 out of 10 at times, no radiation, associated with nausea, retching and constipation.  Pain is intermittent, not relieved with Aleve and Tylenol.  Apparently she was just completing Levaquin that is taken for 10 days about a week ago.  Previous diverticulitis she was not needed to be admitted. Denies any fever or chills. Started feeling better after dose of morphine in the ER and more so vomiting. ED Course: Afebrile.  Blood pressure stable and elevated.  Heart rate 111.  On room air.  Significant abdominal pain.  WC count 18,000.  CT scan abdomen pelvis with uncomplicated sigmoid diverticulitis, suspicious shadow at the neck of the gallbladder.  Patient was given multiple pain medications, ciprofloxacin and Flagyl and sent to the hospital for admission. On my interview, she had just arrived from [place].  She had plenty of nausea and felt bad during ambulance transport.  She wants to close her eyes and relax.  She is not in the mood to try any liquids.  Review of Systems: all systems are reviewed and pertinent positive as per HPI otherwise rest are negative.    Past Medical History:  Diagnosis Date   Abnormal liver function test    Arthritis    knees, hands & hips, back    Breast cancer ([place]) 2019   Left Breast Cancer   Cancer ([place])    DCIS- L breast   Complication of anesthesia     Depression    post partum, also related to her in her 20's   Dyslipidemia    Esophageal spasm    rare occurence , /w ? reflux, no treatment or med. thus far   Headache    tension , sporadic    Hypertension    Hypertriglyceridemia    IBS (irritable bowel syndrome)    PONV (postoperative nausea and vomiting)    Sleep disturbance     Past Surgical History:  Procedure Laterality Date   BREAST BIOPSY Left 08/2017   BREAST BIOPSY Right 09/05/2018   BREAST BIOPSY Left 07/25/2020   BREAST BIOPSY Right 07/25/2020   x2   BREAST LUMPECTOMY Left 09/2017   BREAST LUMPECTOMY WITH RADIOACTIVE SEED LOCALIZATION Left 09/25/2017   Procedure: LEFT BREAST PARTIAL MASTECTOMY WITH RADIOACTIVE SEED LOCALIZATION ER[facility] PATHWAY;  Surgeon: Coralie Keens, MD;  Location: [facility];  Service: General;  Laterality: Left;   BREAST REDUCTION SURGERY     CARDIOVASCULAR STRESS TEST     [place] ARTHROSCOPY Right 2004   REDUCTION MAMMAPLASTY Bilateral 1997   ROOT CANAL     SHOULDER SURGERY Right    2012- arthroscopy   TONSILLECTOMY      Social history   reports that she has never smoked. She has never used smokeless tobacco. She reports that she does not drink alcohol and does not use drugs.  Allergies  Allergen Reactions   Yeast Other (See Comments)    Abdominal pain  Fenofibrate Other (See Comments)    Unknown   Glycerine [Glycerin] Nausea And Vomiting   Keflex [Cephalexin] Nausea And Vomiting   Tetracyclines & Related Nausea And Vomiting    Family History  Problem Relation Age of Onset   Cancer Mother    Breast cancer Mother 78   Cancer Father      Prior to Admission medications   Medication Sig Start Date End Date Taking? Authorizing Provider  acetaminophen (TYLENOL) 325 MG tablet Take 650 mg by mouth every 6 (six) hours as needed for moderate pain or headache.    [provider]  amLODipine (NORVASC) 2.5 MG tablet Take 2.5 mg by mouth daily. 04/13/21    [provider]  diclofenac sodium (VOLTAREN) 1 % GEL Apply 2 g topically 4 (four) times daily.    [provider]  eszopiclone (LUNESTA) 2 MG TABS tablet Take 2 mg by mouth at bedtime as needed. 04/25/22   [provider]  Glucosamine-Chondroitin-MSM [phone] MG TABS Take 2 tablets by mouth daily.    [provider]  ibandronate (BONIVA) 150 MG tablet Take 1 tablet by mouth every 30 (thirty) days. 02/06/21   [provider]  irbesartan-hydrochlorothiazide (AVALIDE) 150-12.5 MG tablet Take 1 tablet by mouth daily. 12/21/18   [provider]  levofloxacin (LEVAQUIN) 750 MG tablet Take 750 mg by mouth daily. 05/10/22   [provider]  Multiple Vitamin (MULTIVITAMIN WITH MINERALS) TABS tablet Take 1 tablet by mouth daily.     [provider]  omeprazole (PRILOSEC) 20 MG capsule Take 20 mg by mouth every morning. 03/08/22   [provider]  ondansetron (ZOFRAN ODT) 4 MG disintegrating tablet Take 1 tablet (4 mg total) by mouth every 8 (eight) hours as needed for nausea or vomiting. 02/08/21   Fredia Sorrow, MD  polyethylene glycol (MIRALAX / GLYCOLAX) packet Take 17 g by mouth daily as needed for moderate constipation.     [provider]  Probiotic Product (PROBIOTIC DAILY PO) Take 1 tablet by mouth as needed (for health).     [provider]  Simethicone (PHAZYME) 180 MG CAPS Take 180-360 mg by mouth daily as needed (for gas).    [provider]  traMADol (ULTRAM) 50 MG tablet Take 1 tablet (50 mg total) by mouth every 6 (six) hours as needed. 02/08/21   Fredia Sorrow, MD  WIXELA INHUB 250-50 MCG/ACT AEPB Inhale 1 puff into the lungs 2 (two) times daily. 05/21/22   [provider]  zaleplon (SONATA) 10 MG capsule Take 10 mg by mouth at bedtime as needed for sleep.    [provider]    Physical Exam: Vitals:   05/25/22 1100 05/25/22 1102 05/25/22 1200 05/25/22 1257  BP:  111/74  125/78 (!) 156/80  Pulse: 82  89 81  Resp: 17   16  Temp:  98.3 F (36.8 C)  (!) 97.4 F (36.3 C)  TempSrc:  Oral  Oral  SpO2: 96%  100% 92%  Weight:        Constitutional: NAD, calm, comfortable, anxious.  Not in any distress. Vitals:   05/25/22 1100 05/25/22 1102 05/25/22 1200 05/25/22 1257  BP: 111/74  125/78 (!) 156/80  Pulse: 82  89 81  Resp: 17   16  Temp:  98.3 F (36.8 C)  (!) 97.4 F (36.3 C)  TempSrc:  Oral  Oral  SpO2: 96%  100% 92%  Weight:       Eyes: PERRL, lids and conjunctivae  normal ENMT: Mucous membranes are moist. Posterior pharynx clear of any exudate or lesions.Normal dentition.  Neck: normal, supple, no masses, no thyromegaly Respiratory: clear to auscultation bilaterally, no wheezing, no crackles. Normal respiratory effort. No accessory muscle use.  Cardiovascular: Regular rate and rhythm, no murmurs / rubs / gallops. No extremity edema. 2+ pedal pulses. No carotid bruits.  Abdomen: Mild tenderness left lower quadrant.  No rigidity.  No guarding.  No masses palpated. No hepatosplenomegaly. Bowel sounds positive.  Musculoskeletal: no clubbing / cyanosis. No joint deformity upper and lower extremities. Good ROM, no contractures. Normal muscle tone.  Skin: no rashes, lesions, ulcers. No induration Neurologic: CN 2-12 grossly intact. Sensation intact, DTR normal. Strength 5/5 in all 4.  Psychiatric: Normal judgment and insight. Alert and oriented x 3.  Anxious.    Labs on Admission: I have personally reviewed following labs and imaging studies  CBC: Recent Labs  Lab 05/25/22 0229  WBC 18.0*  HGB 16.4*  HCT 45.7  MCV 87.7  PLT 268   Basic Metabolic Panel: Recent Labs  Lab 05/25/22 0229  NA 132*  K 3.5  CL 96*  CO2 21*  GLUCOSE 155*  BUN 16  CREATININE 0.82  CALCIUM 9.6   GFR: CrCl cannot be calculated (Unknown ideal weight.). Liver Function Tests: Recent Labs  Lab 05/25/22 0229  AST 27  ALT 34  ALKPHOS 85  BILITOT 1.0   PROT 7.6  ALBUMIN 4.3   Recent Labs  Lab 05/25/22 0229  LIPASE 14   No results for input(s): "AMMONIA" in the last 168 hours. Coagulation Profile: No results for input(s): "INR", "PROTIME" in the last 168 hours. Cardiac Enzymes: No results for input(s): "CKTOTAL", "CKMB", "CKMBINDEX", "TROPONINI" in the last 168 hours. BNP (last 3 results) No results for input(s): "PROBNP" in the last 8760 hours. HbA1C: No results for input(s): "HGBA1C" in the last 72 hours. CBG: No results for input(s): "GLUCAP" in the last 168 hours. Lipid Profile: No results for input(s): "CHOL", "HDL", "LDLCALC", "TRIG", "CHOLHDL", "LDLDIRECT" in the last 72 hours. Thyroid Function Tests: No results for input(s): "TSH", "T4TOTAL", "FREET4", "T3FREE", "THYROIDAB" in the last 72 hours. Anemia Panel: No results for input(s): "VITAMINB12", "FOLATE", "FERRITIN", "TIBC", "IRON", "RETICCTPCT" in the last 72 hours. Urine analysis:    Component Value Date/Time   COLORURINE YELLOW 05/25/2022 0530   APPEARANCEUR CLEAR 05/25/2022 0530   LABSPEC 1.037 (H) 05/25/2022 0530   PHURINE 5.5 05/25/2022 0530   GLUCOSEU NEGATIVE 05/25/2022 0530   HGBUR TRACE (A) 05/25/2022 0530   BILIRUBINUR NEGATIVE 05/25/2022 0530   KETONESUR >80 (A) 05/25/2022 0530   PROTEINUR 30 (A) 05/25/2022 0530   NITRITE NEGATIVE 05/25/2022 0530   LEUKOCYTESUR NEGATIVE 05/25/2022 0530    Radiological Exams on Admission: CT ABDOMEN PELVIS W CONTRAST  Result Date: 05/25/2022 CLINICAL DATA:  71 year old female with history of left lower quadrant abdominal pain for 1 week. EXAM: CT ABDOMEN AND PELVIS WITH CONTRAST TECHNIQUE: Multidetector CT imaging of the abdomen and pelvis was performed using the standard protocol following bolus administration of intravenous contrast. RADIATION DOSE REDUCTION: This exam was performed according to the departmental dose-optimization program which includes automated exposure control, adjustment of the mA and/or kV  according to patient size and/or use of iterative reconstruction technique. CONTRAST:  69m OMNIPAQUE IOHEXOL 300 MG/ML  SOLN COMPARISON:  CT of the abdomen and pelvis 02/10/2021. FINDINGS: Lower chest: Mild scarring in the right middle lobe. Moderate-sized hiatal hernia. Hepatobiliary: No suspicious cystic or solid hepatic lesions. No intra  or extrahepatic biliary ductal dilatation. Along the anterior aspect of the gallbladder in the region of the fundus (axial image 26 of series 2 and sagittal image 37 of series 6) there is a high density structure measuring 1.0 x 1.2 x 0.6 cm, concerning for enhancing soft tissue although this could represent an adherent partially calcified gallstone. No gallbladder wall thickening otherwise noted. No pericholecystic fluid or surrounding inflammatory changes. Pancreas: No pancreatic mass. No pancreatic ductal dilatation. No pancreatic or peripancreatic fluid collections or inflammatory changes. Spleen: Unremarkable. Adrenals/Urinary Tract: 2 mm nonobstructive calculus in the lower pole collecting system of the left kidney. Bilateral kidneys and bilateral adrenal glands are otherwise normal in appearance. No hydroureteronephrosis. Urinary bladder is unremarkable in appearance. Stomach/Bowel: Intra-abdominal portion of the stomach is unremarkable. No pathologic dilatation of small bowel or colon. Numerous colonic diverticuli are noted. In the region of the sigmoid colon there are inflammatory changes in the mesocolon, concerning for potential acute diverticulitis. No discrete diverticular abscess or signs of frank perforation or confidently identified at this time. Normal appendix. Vascular/Lymphatic: Atherosclerotic calcifications in the abdominal aorta and pelvic vasculature, without evidence of aneurysm or dissection. No lymphadenopathy noted in the abdomen or pelvis. Reproductive: Uterus and ovaries are unremarkable in appearance. Other: Trace volume of free fluid, most evident  in the low anatomic pelvis. No pneumoperitoneum. Musculoskeletal: There are no aggressive appearing lytic or blastic lesions noted in the visualized portions of the skeleton. IMPRESSION: 1. Findings are concerning for acute sigmoid diverticulitis. No diverticular abscess or signs of frank perforation are noted at this time. 2. Indeterminate lesion associated with the fundus of the gallbladder suspicious for small soft tissue neoplasm, although this could alternatively represent an adherent gallstone. Further evaluation with follow-up nonemergent outpatient abdominal MRI with and without IV gadolinium with MRCP should be considered after resolution of the patient's acute illness. 3. 2 mm nonobstructive calculus in the lower pole collecting system of the left kidney. 4. Aortic atherosclerosis. 5. Moderate-sized hiatal hernia. Electronically Signed   By: Vinnie Langton M.D.   On: 05/25/2022 06:57    Assessment/Plan Principal Problem:   Acute diverticulitis Active Problems:   Essential hypertension   Malignant neoplasm of upper-outer quadrant of left breast in female, estrogen receptor positive (HCC)   Diverticulitis large intestine   Abnormal CT scan, gallbladder     1.  Acute uncomplicated diverticulitis in a patient with known diverticulosis.  Last colonoscopy 5 years ago with known diverticulosis as per patient. Agree with observation. Continue ciprofloxacin and Flagyl injectable today as she is vomiting and unreliable oral intake. Will allow clears as tolerated. Adequate pain medications with oral and IV narcotics.  Nausea medications. Once clinically improved, will asked to follow-up with GI.  2.  Abnormal CT scan of the gallbladder: Unknown significance.  LFTs normal.  Will discuss about gallbladder MRI once patient has better control of abdominal pain. She does have a history of breast cancer under surveillance, may benefit with MRI of the gallbladder area.  3.  Essential hypertension:  Resume amlodipine.  Hydralazine as needed.  4.  Insomnia: Uses different medications to help sleep.  Continue.     DVT prophylaxis: Lovenox subcu Code Status: Full code Family Communication: None at the bedside Disposition Plan: Home Consults called: None Admission status: Observation, MedSurg bed.  Need for hospitalization will be evaluated every day.   Barb Merino MD [facility] Pager [phone]

## 2022-05-26 DIAGNOSIS — Z170 Estrogen receptor positive status [ER+]: Secondary | ICD-10-CM | POA: Diagnosis not present

## 2022-05-26 DIAGNOSIS — I10 Essential (primary) hypertension: Secondary | ICD-10-CM | POA: Diagnosis present

## 2022-05-26 DIAGNOSIS — Z881 Allergy status to other antibiotic agents status: Secondary | ICD-10-CM | POA: Diagnosis not present

## 2022-05-26 DIAGNOSIS — E781 Pure hyperglyceridemia: Secondary | ICD-10-CM | POA: Diagnosis present

## 2022-05-26 DIAGNOSIS — Z79899 Other long term (current) drug therapy: Secondary | ICD-10-CM | POA: Diagnosis not present

## 2022-05-26 DIAGNOSIS — K589 Irritable bowel syndrome without diarrhea: Secondary | ICD-10-CM | POA: Diagnosis present

## 2022-05-26 DIAGNOSIS — Z803 Family history of malignant neoplasm of breast: Secondary | ICD-10-CM | POA: Diagnosis not present

## 2022-05-26 DIAGNOSIS — K5732 Diverticulitis of large intestine without perforation or abscess without bleeding: Secondary | ICD-10-CM | POA: Diagnosis present

## 2022-05-26 DIAGNOSIS — G4700 Insomnia, unspecified: Secondary | ICD-10-CM | POA: Diagnosis present

## 2022-05-26 DIAGNOSIS — K5792 Diverticulitis of intestine, part unspecified, without perforation or abscess without bleeding: Secondary | ICD-10-CM | POA: Diagnosis not present

## 2022-05-26 DIAGNOSIS — R1032 Left lower quadrant pain: Secondary | ICD-10-CM | POA: Diagnosis present

## 2022-05-26 DIAGNOSIS — E876 Hypokalemia: Secondary | ICD-10-CM | POA: Diagnosis not present

## 2022-05-26 DIAGNOSIS — M159 Polyosteoarthritis, unspecified: Secondary | ICD-10-CM | POA: Diagnosis present

## 2022-05-26 DIAGNOSIS — K567 Ileus, unspecified: Secondary | ICD-10-CM | POA: Diagnosis not present

## 2022-05-26 DIAGNOSIS — Z7983 Long term (current) use of bisphosphonates: Secondary | ICD-10-CM | POA: Diagnosis not present

## 2022-05-26 DIAGNOSIS — Z888 Allergy status to other drugs, medicaments and biological substances status: Secondary | ICD-10-CM | POA: Diagnosis not present

## 2022-05-26 DIAGNOSIS — Z853 Personal history of malignant neoplasm of breast: Secondary | ICD-10-CM | POA: Diagnosis not present

## 2022-05-26 DIAGNOSIS — Z85038 Personal history of other malignant neoplasm of large intestine: Secondary | ICD-10-CM | POA: Diagnosis not present

## 2022-05-26 LAB — COMPREHENSIVE METABOLIC PANEL
ALT: 22 U/L (ref 0–44)
AST: 20 U/L (ref 15–41)
Albumin: 3 g/dL — ABNORMAL LOW (ref 3.5–5.0)
Alkaline Phosphatase: 59 U/L (ref 38–126)
Anion gap: 5 (ref 5–15)
BUN: 11 mg/dL (ref 8–23)
CO2: 25 mmol/L (ref 22–32)
Calcium: 7.5 mg/dL — ABNORMAL LOW (ref 8.9–10.3)
Chloride: 106 mmol/L (ref 98–111)
Creatinine, Ser: 0.67 mg/dL (ref 0.44–1.00)
GFR, Estimated: >60 mL/min (ref >60)
Glucose, Bld: 104 mg/dL — ABNORMAL HIGH (ref 70–99)
Potassium: 3.2 mmol/L — ABNORMAL LOW (ref 3.5–5.1)
Sodium: 136 mmol/L (ref 135–145)
Total Bilirubin: 0.9 mg/dL (ref 0.3–1.2)
Total Protein: 5.8 g/dL — ABNORMAL LOW (ref 6.5–8.1)

## 2022-05-26 LAB — CBC
HCT: 36.3 % (ref 36.0–46.0)
Hemoglobin: 12.5 g/dL (ref 12.0–15.0)
MCH: 31.8 pg (ref 26.0–34.0)
MCHC: 34.4 g/dL (ref 30.0–36.0)
MCV: 92.4 fL (ref 80.0–100.0)
Platelets: 261 10*3/uL (ref 150–400)
RBC: 3.93 MIL/uL (ref 3.87–5.11)
RDW: 12.7 % (ref 11.5–15.5)
WBC: 12.7 10*3/uL — ABNORMAL HIGH (ref 4.0–10.5)
nRBC: 0 % (ref 0.0–0.2)

## 2022-05-26 LAB — GLUCOSE, CAPILLARY: Glucose-Capillary: 110 mg/dL — ABNORMAL HIGH (ref 70–99)

## 2022-05-26 LAB — MAGNESIUM: Magnesium: 2 mg/dL (ref 1.7–2.4)

## 2022-05-26 MED ORDER — BISACODYL 10 MG RE SUPP
10.0000 mg | Freq: Once | RECTAL | Status: AC
  Administered 2022-05-26: 10 mg via RECTAL
  Filled 2022-05-26: qty 1

## 2022-05-26 MED ORDER — POTASSIUM CHLORIDE CRYS ER 20 MEQ PO TBCR
40.0000 meq | EXTENDED_RELEASE_TABLET | Freq: Two times a day (BID) | ORAL | Status: AC
  Administered 2022-05-26 – 2022-05-27 (×4): 40 meq via ORAL
  Filled 2022-05-26 (×4): qty 2

## 2022-05-26 MED ORDER — SIMETHICONE 80 MG PO CHEW
80.0000 mg | CHEWABLE_TABLET | Freq: Once | ORAL | Status: AC
  Administered 2022-05-26: 80 mg via ORAL
  Filled 2022-05-26: qty 1

## 2022-05-26 NOTE — Plan of Care (Signed)

## 2022-05-26 NOTE — Progress Notes (Signed)
PROGRESS NOTE    [name]  HBZ:[id number] DOB: June 08, 1951 DOA: 05/25/2022 PCP: [name], MD    Brief Narrative:  71 year old with history of sigmoid diverticulosis and previous history of diverticulitis, history of in situ breast cancer, hypertension and insomnia presented to the ED with 3 days of gradual onset abdominal pain associated nausea retching and constipation.  In the emergency room hemodynamically stable.  WBC count 18,000.  CT scan abdomen pelvis with uncomplicated sigmoid diverticulitis.  Admitted with significant findings, nausea vomiting and leukocytosis.   Assessment & Plan:   Acute uncomplicated diverticulitis: Remains with persistent discomfort.  Cautiously advance diet to full liquid diet.  Dulcolax suppository, avoiding laxatives.  Adequate pain medications.  IV fluids.  Serial abdominal exam. Last colonoscopy 5 years ago.  She has well-established GI follow-up. Continue antibiotics with Cipro and Flagyl.  Abnormal CT scan of the gallbladder: Unknown significance.  LFTs normal.  Discussed with patient about doing gallbladder MRI and she declined and stated "I will just focus on the diverticulitis that brought me here".  Essential hypertension: Amlodipine.  Stable.  Insomnia: On multiple medications.  Stable.  Hypokalemia: Replace and monitor.   DVT prophylaxis: enoxaparin (LOVENOX) injection 40 mg Start: 05/25/22 2200   Code Status: Full code Family Communication: None at the bedside Disposition Plan: Status is: Observation The patient will require care spanning > 2 midnights and should be moved to inpatient because: IV antibiotics     Consultants:  None  Procedures:  None  Antimicrobials:  Cipro and Flagyl 10/13   Subjective: Patient was seen and examined.  In the morning rounds she denied any nausea.  She had diffuse abdominal pain and bloating.  Passing flatus but no bowel movement.  She had not have any bowel movement for  last 4 days. Diet advanced to full liquid diet, she started having more bloating sensation.  Objective: Vitals:   05/25/22 2012 05/26/22 0057 05/26/22 0448 05/26/22 1246  BP: 123/64 131/68 120/75 125/79  Pulse: 94 83 85 88  Resp: '18 18 18 18  '$ Temp: 98.7 F (37.1 C) 98.6 F (37 C) 97.9 F (36.6 C) 98.5 F (36.9 C)  TempSrc: Oral Oral Oral Oral  SpO2: 97% 99% 95% 96%  Weight:        Intake/Output Summary (Last 24 hours) at 05/26/2022 1409 Last data filed at 05/26/2022 0609 Gross per 24 hour  Intake 523.83 ml  Output 1300 ml  Net -776.17 ml   Filed Weights   05/25/22 0217  Weight: 60.8 kg    Examination:  General exam: Appears calm and comfortable, slightly anxious. Respiratory system: Clear to auscultation. Respiratory effort normal.  No added sounds. Cardiovascular system: S1 & S2 heard, RRR. No JVD, murmurs, rubs, gallops or clicks. No pedal edema. Gastrointestinal system: Soft.  Mildly distended.  No localized tenderness but mild tenderness suprapubic and left lateral quadrant. Central nervous system: Alert and oriented. No focal neurological deficits. Extremities: Symmetric 5 x 5 power. Skin: No rashes, lesions or ulcers Psychiatry: Judgement and insight appear normal. Mood & affect anxious.    Data Reviewed: I have personally reviewed following labs and imaging studies  CBC: Recent Labs  Lab 05/25/22 0229 05/26/22 0515  WBC 18.0* 12.7*  HGB 16.4* 12.5  HCT 45.7 36.3  MCV 87.7 92.4  PLT 374 101   Basic Metabolic Panel: Recent Labs  Lab 05/25/22 0229 05/26/22 0515  NA 132* 136  K 3.5 3.2*  CL 96* 106  CO2 21* 25  GLUCOSE  155* 104*  BUN 16 11  CREATININE 0.82 0.67  CALCIUM 9.6 7.5*  MG  --  2.0   GFR: CrCl cannot be calculated (Unknown ideal weight.). Liver Function Tests: Recent Labs  Lab 05/25/22 0229 05/26/22 0515  AST 27 20  ALT 34 22  ALKPHOS 85 59  BILITOT 1.0 0.9  PROT 7.6 5.8*  ALBUMIN 4.3 3.0*   Recent Labs  Lab  05/25/22 0229  LIPASE 14   No results for input(s): "AMMONIA" in the last 168 hours. Coagulation Profile: No results for input(s): "INR", "PROTIME" in the last 168 hours. Cardiac Enzymes: No results for input(s): "CKTOTAL", "CKMB", "CKMBINDEX", "TROPONINI" in the last 168 hours. BNP (last 3 results) No results for input(s): "PROBNP" in the last 8760 hours. HbA1C: No results for input(s): "HGBA1C" in the last 72 hours. CBG: Recent Labs  Lab 05/26/22 0747  GLUCAP 110*   Lipid Profile: No results for input(s): "CHOL", "HDL", "LDLCALC", "TRIG", "CHOLHDL", "LDLDIRECT" in the last 72 hours. Thyroid Function Tests: No results for input(s): "TSH", "T4TOTAL", "FREET4", "T3FREE", "THYROIDAB" in the last 72 hours. Anemia Panel: No results for input(s): "VITAMINB12", "FOLATE", "FERRITIN", "TIBC", "IRON", "RETICCTPCT" in the last 72 hours. Sepsis Labs: No results for input(s): "PROCALCITON", "LATICACIDVEN" in the last 168 hours.  No results found for this or any previous visit (from the past 240 hour(s)).       Radiology Studies: CT ABDOMEN PELVIS W CONTRAST  Result Date: 05/25/2022 CLINICAL DATA:  71 year old female with history of left lower quadrant abdominal pain for 1 week. EXAM: CT ABDOMEN AND PELVIS WITH CONTRAST TECHNIQUE: Multidetector CT imaging of the abdomen and pelvis was performed using the standard protocol following bolus administration of intravenous contrast. RADIATION DOSE REDUCTION: This exam was performed according to the departmental dose-optimization program which includes automated exposure control, adjustment of the mA and/or kV according to patient size and/or use of iterative reconstruction technique. CONTRAST:  27m OMNIPAQUE IOHEXOL 300 MG/ML  SOLN COMPARISON:  CT of the abdomen and pelvis 02/10/2021. FINDINGS: Lower chest: Mild scarring in the right middle lobe. Moderate-sized hiatal hernia. Hepatobiliary: No suspicious cystic or solid hepatic lesions. No intra  or extrahepatic biliary ductal dilatation. Along the anterior aspect of the gallbladder in the region of the fundus (axial image 26 of series 2 and sagittal image 37 of series 6) there is a high density structure measuring 1.0 x 1.2 x 0.6 cm, concerning for enhancing soft tissue although this could represent an adherent partially calcified gallstone. No gallbladder wall thickening otherwise noted. No pericholecystic fluid or surrounding inflammatory changes. Pancreas: No pancreatic mass. No pancreatic ductal dilatation. No pancreatic or peripancreatic fluid collections or inflammatory changes. Spleen: Unremarkable. Adrenals/Urinary Tract: 2 mm nonobstructive calculus in the lower pole collecting system of the left kidney. Bilateral kidneys and bilateral adrenal glands are otherwise normal in appearance. No hydroureteronephrosis. Urinary bladder is unremarkable in appearance. Stomach/Bowel: Intra-abdominal portion of the stomach is unremarkable. No pathologic dilatation of small bowel or colon. Numerous colonic diverticuli are noted. In the region of the sigmoid colon there are inflammatory changes in the mesocolon, concerning for potential acute diverticulitis. No discrete diverticular abscess or signs of frank perforation or confidently identified at this time. Normal appendix. Vascular/Lymphatic: Atherosclerotic calcifications in the abdominal aorta and pelvic vasculature, without evidence of aneurysm or dissection. No lymphadenopathy noted in the abdomen or pelvis. Reproductive: Uterus and ovaries are unremarkable in appearance. Other: Trace volume of free fluid, most evident in the low anatomic pelvis. No pneumoperitoneum. Musculoskeletal:  There are no aggressive appearing lytic or blastic lesions noted in the visualized portions of the skeleton. IMPRESSION: 1. Findings are concerning for acute sigmoid diverticulitis. No diverticular abscess or signs of frank perforation are noted at this time. 2. Indeterminate  lesion associated with the fundus of the gallbladder suspicious for small soft tissue neoplasm, although this could alternatively represent an adherent gallstone. Further evaluation with follow-up nonemergent outpatient abdominal MRI with and without IV gadolinium with MRCP should be considered after resolution of the patient's acute illness. 3. 2 mm nonobstructive calculus in the lower pole collecting system of the left kidney. 4. Aortic atherosclerosis. 5. Moderate-sized hiatal hernia. Electronically Signed   By: Vinnie Langton M.D.   On: 05/25/2022 06:57        Scheduled Meds:  amLODipine  2.5 mg Oral Daily   bisacodyl  10 mg Rectal Once   enoxaparin (LOVENOX) injection  40 mg Subcutaneous Q24H   potassium chloride  40 mEq Oral BID   Continuous Infusions:  sodium chloride 125 mL/hr at 05/26/22 0723   ciprofloxacin 400 mg (05/26/22 0835)   metronidazole 500 mg (05/26/22 1404)     LOS: 0 days    Time spent: 35 minutes    Barb Merino, MD [facility] Pager [phone]

## 2022-05-27 ENCOUNTER — Inpatient Hospital Stay (HOSPITAL_COMMUNITY): Payer: Medicare Other

## 2022-05-27 DIAGNOSIS — K5792 Diverticulitis of intestine, part unspecified, without perforation or abscess without bleeding: Secondary | ICD-10-CM | POA: Diagnosis not present

## 2022-05-27 LAB — GLUCOSE, CAPILLARY: Glucose-Capillary: 94 mg/dL (ref 70–99)

## 2022-05-27 LAB — CBC WITH DIFFERENTIAL/PLATELET
Abs Immature Granulocytes: 0.06 10*3/uL (ref 0.00–0.07)
Basophils Absolute: 0 10*3/uL (ref 0.0–0.1)
Basophils Relative: 0 %
Eosinophils Absolute: 0.1 10*3/uL (ref 0.0–0.5)
Eosinophils Relative: 1 %
HCT: 34.1 % — ABNORMAL LOW (ref 36.0–46.0)
Hemoglobin: 11.5 g/dL — ABNORMAL LOW (ref 12.0–15.0)
Immature Granulocytes: 1 %
Lymphocytes Relative: 16 %
Lymphs Abs: 1.6 10*3/uL (ref 0.7–4.0)
MCH: 31.7 pg (ref 26.0–34.0)
MCHC: 33.7 g/dL (ref 30.0–36.0)
MCV: 93.9 fL (ref 80.0–100.0)
Monocytes Absolute: 0.6 10*3/uL (ref 0.1–1.0)
Monocytes Relative: 7 %
Neutro Abs: 7.3 10*3/uL (ref 1.7–7.7)
Neutrophils Relative %: 75 %
Platelets: 249 10*3/uL (ref 150–400)
RBC: 3.63 MIL/uL — ABNORMAL LOW (ref 3.87–5.11)
RDW: 12.7 % (ref 11.5–15.5)
WBC: 9.8 10*3/uL (ref 4.0–10.5)
nRBC: 0 % (ref 0.0–0.2)

## 2022-05-27 LAB — PHOSPHORUS: Phosphorus: 2.1 mg/dL — ABNORMAL LOW (ref 2.5–4.6)

## 2022-05-27 LAB — BASIC METABOLIC PANEL
Anion gap: 4 — ABNORMAL LOW (ref 5–15)
BUN: 11 mg/dL (ref 8–23)
CO2: 24 mmol/L (ref 22–32)
Calcium: 7.5 mg/dL — ABNORMAL LOW (ref 8.9–10.3)
Chloride: 108 mmol/L (ref 98–111)
Creatinine, Ser: 0.59 mg/dL (ref 0.44–1.00)
GFR, Estimated: >60 mL/min (ref >60)
Glucose, Bld: 112 mg/dL — ABNORMAL HIGH (ref 70–99)
Potassium: 4.2 mmol/L (ref 3.5–5.1)
Sodium: 136 mmol/L (ref 135–145)

## 2022-05-27 LAB — MAGNESIUM: Magnesium: 2.1 mg/dL (ref 1.7–2.4)

## 2022-05-27 MED ORDER — SIMETHICONE 80 MG PO CHEW
80.0000 mg | CHEWABLE_TABLET | Freq: Four times a day (QID) | ORAL | Status: DC | PRN
  Administered 2022-05-27 – 2022-05-30 (×6): 80 mg via ORAL
  Filled 2022-05-27 (×7): qty 1

## 2022-05-27 MED ORDER — RISAQUAD PO CAPS
1.0000 | ORAL_CAPSULE | Freq: Three times a day (TID) | ORAL | Status: DC
  Administered 2022-05-27 – 2022-05-31 (×13): 1 via ORAL
  Filled 2022-05-27 (×13): qty 1

## 2022-05-27 MED ORDER — SODIUM CHLORIDE 0.9 % IV SOLN
12.5000 mg | Freq: Once | INTRAVENOUS | Status: AC
  Administered 2022-05-27: 12.5 mg via INTRAVENOUS
  Filled 2022-05-27: qty 12.5

## 2022-05-27 MED ORDER — POTASSIUM PHOSPHATES 15 MMOLE/5ML IV SOLN
21.0000 mmol | Freq: Once | INTRAVENOUS | Status: AC
  Administered 2022-05-27: 21 mmol via INTRAVENOUS
  Filled 2022-05-27: qty 7

## 2022-05-27 MED ORDER — BISACODYL 10 MG RE SUPP
10.0000 mg | Freq: Once | RECTAL | Status: AC
  Administered 2022-05-27: 10 mg via RECTAL
  Filled 2022-05-27: qty 1

## 2022-05-27 MED ORDER — POLYETHYLENE GLYCOL 3350 17 G PO PACK
17.0000 g | PACK | Freq: Two times a day (BID) | ORAL | Status: DC
  Administered 2022-05-27 – 2022-05-29 (×5): 17 g via ORAL
  Filled 2022-05-27 (×5): qty 1

## 2022-05-27 MED ORDER — SODIUM CHLORIDE 0.9 % IV SOLN
6.2500 mg | Freq: Once | INTRAVENOUS | Status: DC
  Filled 2022-05-27 (×2): qty 0.25

## 2022-05-27 MED ORDER — SIMETHICONE 80 MG PO CHEW
80.0000 mg | CHEWABLE_TABLET | Freq: Once | ORAL | Status: AC
  Administered 2022-05-27: 80 mg via ORAL
  Filled 2022-05-27: qty 1

## 2022-05-27 NOTE — Plan of Care (Signed)

## 2022-05-27 NOTE — Progress Notes (Signed)
Patient asked for something for gas. MD was notified of request. Patient took gas tablets from purse. Told nurse tech. Nurse tech told nurse. Patient education on hospital policies of not taking home medications while in the hospital. Patient complained nurse took too long. There was no PRN order for gas tablets, was waiting for MD to put in orders. Patient continued to yell at nurse. Patient stated she does not care what procedures or policies staff have to follow. Patient stated she needed gas tablets and she can take better care of herself at home. MD made aware of patient self medicating at beside.

## 2022-05-27 NOTE — Progress Notes (Signed)
PROGRESS NOTE    [name]  ZOX:[id number] DOB: 1951/07/03 DOA: 05/25/2022 PCP: [name], MD    Brief Narrative:  71 year old with history of sigmoid diverticulosis and previous history of diverticulitis, history of in situ breast cancer, hypertension and insomnia presented to the ED with 3 days of gradual onset abdominal pain associated nausea retching and constipation.  In the emergency room hemodynamically stable.  WBC count 18,000.  CT scan abdomen pelvis with uncomplicated sigmoid diverticulitis.  Admitted with significant findings, nausea vomiting and leukocytosis.   Assessment & Plan:   Acute uncomplicated diverticulitis, abdominal pain. Pain somehow improved, tolerating full liquid diet but no bowel movement.  Complains of gaseous distention.  WBC normalizing. Continue maintenance IV fluids.  Continue small amount of full liquid diet.  Serial abdominal exam. CT scan on admission without any complicated diverticulitis. KUB today with ileus, stool burden. 1 dose of MiraLAX today, 1 dose of repeat Dulcolax suppository.  Mobility.  Adequate pain medications.  Continue antibiotics with Cipro and Flagyl today.  Abnormal CT scan of the gallbladder: Unknown significance.  LFTs normal.  Discussed with patient about doing gallbladder MRI and she declined and stated "I will just focus on the diverticulitis that brought me here".  Essential hypertension: Amlodipine.  Stable.  Insomnia: On multiple medications.  Stable.  Hypokalemia/hypophosphatemia: Further replaced today with IV phosphate.   DVT prophylaxis: enoxaparin (LOVENOX) injection 40 mg Start: 05/25/22 2200   Code Status: Full code Family Communication: None at the bedside Disposition Plan: Status is: Inpatient.  Remains inpatient.  Persistent abdominal pain and distention.  Consultants:  None  Procedures:  None  Antimicrobials:  Cipro and Flagyl 10/13   Subjective: Patient examined.  Still has  abdominal distention.  She had very small sized hard stool last night.  Passing flatus.  Denies any nausea or vomiting but she feels like a bowel movement would be helpful.  She was asking for enema.  Objective: Vitals:   05/26/22 0448 05/26/22 1246 05/26/22 2118 05/27/22 0508  BP: 120/75 125/79  115/67  Pulse: 85 88  78  Resp: '18 18  18  '$ Temp: 97.9 F (36.6 C) 98.5 F (36.9 C)  97.9 F (36.6 C)  TempSrc: Oral Oral  Oral  SpO2: 95% 96%  96%  Weight:   65 kg   Height:   5' (1.524 m)     Intake/Output Summary (Last 24 hours) at 05/27/2022 1202 Last data filed at 05/27/2022 0500 Gross per 24 hour  Intake 3960.05 ml  Output 500 ml  Net 3460.05 ml   Filed Weights   05/25/22 0217 05/26/22 2118  Weight: 60.8 kg 65 kg    Examination:  General exam: Appears calm and comfortable,  Appropriately anxious. Respiratory system: Clear to auscultation. Respiratory effort normal.  No added sounds. Cardiovascular system: S1 & S2 heard, RRR. No JVD, murmurs, rubs, gallops or clicks. No pedal edema. Gastrointestinal system: Soft.  Mildly distended but nontender.  Bowel sound present.  No localized tenderness but mild tenderness suprapubic and left lateral quadrant. Central nervous system: Alert and oriented. No focal neurological deficits. Extremities: Symmetric 5 x 5 power. Skin: No rashes, lesions or ulcers Psychiatry: Judgement and insight appear normal. Mood & affect anxious.    Data Reviewed: I have personally reviewed following labs and imaging studies  CBC: Recent Labs  Lab 05/25/22 0229 05/26/22 0515 05/27/22 0536  WBC 18.0* 12.7* 9.8  NEUTROABS  --   --  7.3  HGB 16.4* 12.5 11.5*  HCT  45.7 36.3 34.1*  MCV 87.7 92.4 93.9  PLT 374 261 564   Basic Metabolic Panel: Recent Labs  Lab 05/25/22 0229 05/26/22 0515 05/27/22 0536  NA 132* 136 136  K 3.5 3.2* 4.2  CL 96* 106 108  CO2 21* 25 24  GLUCOSE 155* 104* 112*  BUN '16 11 11  '$ CREATININE 0.82 0.67 0.59  CALCIUM 9.6  7.5* 7.5*  MG  --  2.0 2.1  PHOS  --   --  2.1*   GFR: Estimated Creatinine Clearance: 52.7 mL/min (by C-G formula based on SCr of 0.59 mg/dL). Liver Function Tests: Recent Labs  Lab 05/25/22 0229 05/26/22 0515  AST 27 20  ALT 34 22  ALKPHOS 85 59  BILITOT 1.0 0.9  PROT 7.6 5.8*  ALBUMIN 4.3 3.0*   Recent Labs  Lab 05/25/22 0229  LIPASE 14   No results for input(s): "AMMONIA" in the last 168 hours. Coagulation Profile: No results for input(s): "INR", "PROTIME" in the last 168 hours. Cardiac Enzymes: No results for input(s): "CKTOTAL", "CKMB", "CKMBINDEX", "TROPONINI" in the last 168 hours. BNP (last 3 results) No results for input(s): "PROBNP" in the last 8760 hours. HbA1C: No results for input(s): "HGBA1C" in the last 72 hours. CBG: Recent Labs  Lab 05/26/22 0747 05/27/22 0736  GLUCAP 110* 94   Lipid Profile: No results for input(s): "CHOL", "HDL", "LDLCALC", "TRIG", "CHOLHDL", "LDLDIRECT" in the last 72 hours. Thyroid Function Tests: No results for input(s): "TSH", "T4TOTAL", "FREET4", "T3FREE", "THYROIDAB" in the last 72 hours. Anemia Panel: No results for input(s): "VITAMINB12", "FOLATE", "FERRITIN", "TIBC", "IRON", "RETICCTPCT" in the last 72 hours. Sepsis Labs: No results for input(s): "PROCALCITON", "LATICACIDVEN" in the last 168 hours.  No results found for this or any previous visit (from the past 240 hour(s)).       Radiology Studies: DG Abd 1 View  Result Date: 05/27/2022 CLINICAL DATA:  Abdominal distention. EXAM: ABDOMEN - 1 VIEW COMPARISON:  02/10/2021 FINDINGS: Mild gaseous distention of small bowel up to 2.3 cm diameter without frankly obstructive pattern. Prominent stool volume noted throughout the colon. Mild gaseous distention of the stomach. IMPRESSION: Prominent stool volume throughout the colon. Mild gaseous distention of central small bowel loops. Component of underlying ileus would be a consideration. Electronically Signed   By: Misty Stanley M.D.   On: 05/27/2022 09:16        Scheduled Meds:  acidophilus  1 capsule Oral TID   amLODipine  2.5 mg Oral Daily   bisacodyl  10 mg Rectal Once   enoxaparin (LOVENOX) injection  40 mg Subcutaneous Q24H   polyethylene glycol  17 g Oral BID   potassium chloride  40 mEq Oral BID   Continuous Infusions:  sodium chloride 125 mL/hr at 05/27/22 0457   ciprofloxacin 400 mg (05/27/22 1013)   metronidazole Stopped (05/26/22 1923)   potassium PHOSPHATE IVPB (in mmol)       LOS: 1 day    Time spent: 35 minutes    Barb Merino, MD [facility] Pager [phone]

## 2022-05-28 ENCOUNTER — Inpatient Hospital Stay (HOSPITAL_COMMUNITY): Payer: Medicare Other

## 2022-05-28 DIAGNOSIS — K5792 Diverticulitis of intestine, part unspecified, without perforation or abscess without bleeding: Secondary | ICD-10-CM | POA: Diagnosis not present

## 2022-05-28 LAB — GLUCOSE, CAPILLARY: Glucose-Capillary: 95 mg/dL (ref 70–99)

## 2022-05-28 MED ORDER — IOHEXOL 300 MG/ML  SOLN
100.0000 mL | Freq: Once | INTRAMUSCULAR | Status: AC | PRN
  Administered 2022-05-28: 100 mL via INTRAVENOUS

## 2022-05-28 MED ORDER — GUAIFENESIN-DM 100-10 MG/5ML PO SYRP
5.0000 mL | ORAL_SOLUTION | ORAL | Status: DC | PRN
  Administered 2022-05-28 – 2022-05-31 (×5): 5 mL via ORAL
  Filled 2022-05-28 (×6): qty 5

## 2022-05-28 MED ORDER — SODIUM CHLORIDE (PF) 0.9 % IJ SOLN
INTRAMUSCULAR | Status: AC
  Filled 2022-05-28: qty 50

## 2022-05-28 MED ORDER — IOHEXOL 9 MG/ML PO SOLN
ORAL | Status: AC
  Filled 2022-05-28: qty 1000

## 2022-05-28 MED ORDER — SODIUM CHLORIDE 0.9 % IV SOLN
12.5000 mg | Freq: Four times a day (QID) | INTRAVENOUS | Status: DC | PRN
  Administered 2022-05-28 – 2022-05-30 (×3): 12.5 mg via INTRAVENOUS
  Filled 2022-05-28 (×3): qty 12.5

## 2022-05-28 MED ORDER — IOHEXOL 9 MG/ML PO SOLN: 500.0000 mL | ORAL | Status: AC

## 2022-05-28 NOTE — Progress Notes (Signed)
PROGRESS NOTE    [name]  JAS:[id number] DOB: 03-11-1951 DOA: 05/25/2022 PCP: [name], MD    Brief Narrative:  71 year old with history of sigmoid diverticulosis and previous history of diverticulitis, history of in situ breast cancer, hypertension and insomnia presented to the ED with 3 days of gradual onset abdominal pain associated nausea , retching and constipation.  In the emergency room hemodynamically stable.  WBC count 18,000.  CT scan abdomen pelvis with uncomplicated sigmoid diverticulitis.  Admitted with significant findings, nausea vomiting and leukocytosis. Remains with persistent abdominal pain and distention.   Assessment & Plan:   Acute uncomplicated diverticulitis, abdominal pain. Pain somehow improved, tolerating full liquid diet but no bowel movement.  Complains of gaseous distention.  WBC normalizing. Continue maintenance IV fluids.  Continue small amount of full liquid diet.  Serial abdominal exam. CT scan on admission without any complicated diverticulitis. Repeat CT scan today, patient refused oral contrast shows persistent left-sided diverticulitis or colitis without perforation or abscess formation. Continue ciprofloxacin and Flagyl.  Will hold further laxative therapy with acute inflammation.  Mobilize.  Check electrolytes. Discussed with GI for consultation.  Abnormal CT scan of the gallbladder: Unknown significance.  LFTs normal.  Discussed with patient about doing gallbladder MRI and she declined and stated "I will just focus on the diverticulitis that brought me here".  Essential hypertension: Amlodipine.  Stable.  Insomnia: On multiple medications.  Stable.  Hypokalemia/hypophosphatemia: Further replaced.  Recheck tomorrow.   DVT prophylaxis: enoxaparin (LOVENOX) injection 40 mg Start: 05/25/22 2200   Code Status: Full code Family Communication: None at the bedside Disposition Plan: Status is: Inpatient.  Remains inpatient.   Persistent abdominal pain and distention.  Consultants:  Gastroenterology  Procedures:  None  Antimicrobials:  Cipro and Flagyl 10/13   Subjective: Patient was seen and examined.  Overnight had mild distention and occasional moderate pain.  Did not have any bowel movement despite suppository and dose of MiraLAX. She wants to talk to gastroenterology to see if they have any more ideas. Able to tolerate sips of full liquid diet.  Feels weak.  Objective: Vitals:   05/28/22 0146 05/28/22 0613 05/28/22 0700 05/28/22 1330  BP:  123/72  128/77  Pulse:  91  87  Resp:  18    Temp: 98.5 F (36.9 C) 100 F (37.8 C) 98.5 F (36.9 C) 100 F (37.8 C)  TempSrc: Oral Oral Oral Oral  SpO2: 96% 91%  93%  Weight:      Height:        Intake/Output Summary (Last 24 hours) at 05/28/2022 1406 Last data filed at 05/27/2022 2253 Gross per 24 hour  Intake 1831.78 ml  Output 1000 ml  Net 831.78 ml   Filed Weights   05/25/22 0217 05/26/22 2118  Weight: 60.8 kg 65 kg    Examination:  General exam: Looks in mild distress and anxious but comfortable in completing sentences. Respiratory system: Clear to auscultation. Respiratory effort normal.  No added sounds. Cardiovascular system: S1 & S2 heard, RRR. No JVD, murmurs, rubs, gallops or clicks. No pedal edema. Gastrointestinal system: Soft.  Mildly distended but nontender.  Bowel sounds present.  No localized tenderness but mild tenderness suprapubic and left lateral quadrant. Central nervous system: Alert and oriented. No focal neurological deficits. Extremities: Symmetric 5 x 5 power. Skin: No rashes, lesions or ulcers Psychiatry: Judgement and insight appear normal. Mood & affect anxious.    Data Reviewed: I have personally reviewed following labs and imaging studies  CBC: Recent Labs  Lab 05/25/22 0229 05/26/22 0515 05/27/22 0536  WBC 18.0* 12.7* 9.8  NEUTROABS  --   --  7.3  HGB 16.4* 12.5 11.5*  HCT 45.7 36.3 34.1*  MCV  87.7 92.4 93.9  PLT 374 261 169   Basic Metabolic Panel: Recent Labs  Lab 05/25/22 0229 05/26/22 0515 05/27/22 0536  NA 132* 136 136  K 3.5 3.2* 4.2  CL 96* 106 108  CO2 21* 25 24  GLUCOSE 155* 104* 112*  BUN '16 11 11  '$ CREATININE 0.82 0.67 0.59  CALCIUM 9.6 7.5* 7.5*  MG  --  2.0 2.1  PHOS  --   --  2.1*   GFR: Estimated Creatinine Clearance: 52.7 mL/min (by C-G formula based on SCr of 0.59 mg/dL). Liver Function Tests: Recent Labs  Lab 05/25/22 0229 05/26/22 0515  AST 27 20  ALT 34 22  ALKPHOS 85 59  BILITOT 1.0 0.9  PROT 7.6 5.8*  ALBUMIN 4.3 3.0*   Recent Labs  Lab 05/25/22 0229  LIPASE 14   No results for input(s): "AMMONIA" in the last 168 hours. Coagulation Profile: No results for input(s): "INR", "PROTIME" in the last 168 hours. Cardiac Enzymes: No results for input(s): "CKTOTAL", "CKMB", "CKMBINDEX", "TROPONINI" in the last 168 hours. BNP (last 3 results) No results for input(s): "PROBNP" in the last 8760 hours. HbA1C: No results for input(s): "HGBA1C" in the last 72 hours. CBG: Recent Labs  Lab 05/26/22 0747 05/27/22 0736 05/28/22 0728  GLUCAP 110* 94 95   Lipid Profile: No results for input(s): "CHOL", "HDL", "LDLCALC", "TRIG", "CHOLHDL", "LDLDIRECT" in the last 72 hours. Thyroid Function Tests: No results for input(s): "TSH", "T4TOTAL", "FREET4", "T3FREE", "THYROIDAB" in the last 72 hours. Anemia Panel: No results for input(s): "VITAMINB12", "FOLATE", "FERRITIN", "TIBC", "IRON", "RETICCTPCT" in the last 72 hours. Sepsis Labs: No results for input(s): "PROCALCITON", "LATICACIDVEN" in the last 168 hours.  No results found for this or any previous visit (from the past 240 hour(s)).       Radiology Studies: CT ABDOMEN PELVIS W CONTRAST  Result Date: 05/28/2022 CLINICAL DATA:  Persistent abdominal pain with distention and ileus. Diverticulitis. EXAM: CT ABDOMEN AND PELVIS WITH CONTRAST TECHNIQUE: Multidetector CT imaging of the abdomen  and pelvis was performed using the standard protocol following bolus administration of intravenous contrast. RADIATION DOSE REDUCTION: This exam was performed according to the departmental dose-optimization program which includes automated exposure control, adjustment of the mA and/or kV according to patient size and/or use of iterative reconstruction technique. CONTRAST:  133m OMNIPAQUE IOHEXOL 300 MG/ML  SOLN COMPARISON:  06/25/2022 FINDINGS: Lower chest: New small bilateral pleural effusions with associated mild lower lobe atelectasis bilaterally. Mild cardiomegaly. Small to moderate-sized type 3 hiatal hernia. Mild scarring or atelectasis in the right middle lobe. Hepatobiliary: There is a suggestion of accentuated density anteriorly along the gallbladder fundus, possibly from hyperplastic cholecystosis, adherent gallstone, or Phrygian cap. Similar appearance back through 12/18/2010 indicates that this is unlikely to be a malignancy. Accentuated density in the gallbladder probably from vicarious contrast excretion. No focal hepatic parenchymal lesion is observed. Pancreas: Unremarkable Spleen: Unremarkable Adrenals/Urinary Tract: Both adrenal glands appear normal. 3 mm right kidney lower pole nonobstructive renal calculus on image 51 series 4. 4 mm left mid to lower kidney nonobstructive renal calculus, image 54 series 4. 6 mm left mid kidney hypodense lesion on image 37 of series 2 is unchanged from 12/18/2010 and compatible with a benign cyst. No further workup is warranted. No hydronephrosis  or hydroureter. The kidneys appear otherwise unremarkable. Mild wall thickening in the dome of the urinary bladder as on image 64 series 5, likely secondary to the adjacent inflammatory findings in the sigmoid colon. Stomach/Bowel: Fluid-filled proximal large bowel with air-fluid levels. There is descending and sigmoid colon diverticulosis with some wall thickening in the proximal sigmoid colon on image 41 series 4, and  suspected inflammation in the distal sigmoid colon potentially from colitis or active diverticulitis. No extraluminal gas or abscess is observed. Infiltrative fluid signal is most notable along the distal sigmoid colon and paracolic adipose tissue for example on image 57 series 2. No appendiceal inflammation observed. No dilated small bowel. Vascular/Lymphatic: Mild atherosclerotic calcification in the left renal artery and abdominal aorta. The celiac trunk, SMA, and inferior mesenteric artery appear patent. Scattered retroperitoneal lymph nodes are probably reactive and include a 0.7 cm right periaortic lymph node on image 31 series 2. Reproductive: Unremarkable Other: Scattered mild ascites, new compared to previous. Musculoskeletal: Lower lumbar degenerative facet arthropathy. IMPRESSION: 1. Wall thickening in the proximal sigmoid colon and in the distal sigmoid colon, with adjacent infiltrative fluid signal in the paracolic adipose tissue. Appearance favors colitis or active diverticulitis. No extraluminal gas or abscess. 2. New small bilateral pleural effusions with mild lower lobe atelectasis. 3. New mild ascites. 4. Mild wall thickening in the dome of the urinary bladder, likely secondary to the adjacent inflammatory findings in the sigmoid colon. 5. Small to moderate-sized type 3 hiatal hernia. 6. Mild cardiomegaly. 7. Bilateral nonobstructive nephrolithiasis. 8. Accentuated density anteriorly along the gallbladder fundus, possibly from hyperplastic cholecystosis, adherent gallstone, or Phrygian cap. Similar appearance back through 12/18/2010 indicates that this is unlikely to be a malignancy. 9. Mild atherosclerotic calcification in the abdominal aorta and left renal artery. Aortic Atherosclerosis (ICD10-I70.0). Electronically Signed   By: Van Clines M.D.   On: 05/28/2022 13:02   DG Abd 1 View  Result Date: 05/27/2022 CLINICAL DATA:  Abdominal distention. EXAM: ABDOMEN - 1 VIEW COMPARISON:   02/10/2021 FINDINGS: Mild gaseous distention of small bowel up to 2.3 cm diameter without frankly obstructive pattern. Prominent stool volume noted throughout the colon. Mild gaseous distention of the stomach. IMPRESSION: Prominent stool volume throughout the colon. Mild gaseous distention of central small bowel loops. Component of underlying ileus would be a consideration. Electronically Signed   By: Misty Stanley M.D.   On: 05/27/2022 09:16        Scheduled Meds:  acidophilus  1 capsule Oral TID   amLODipine  2.5 mg Oral Daily   enoxaparin (LOVENOX) injection  40 mg Subcutaneous Q24H   iohexol       polyethylene glycol  17 g Oral BID   sodium chloride (PF)       Continuous Infusions:  sodium chloride 125 mL/hr at 05/28/22 0625   ciprofloxacin 400 mg (05/28/22 0825)   metronidazole 500 mg (05/28/22 0832)   promethazine (PHENERGAN) injection (IM or IVPB) 12.5 mg (05/28/22 1008)     LOS: 2 days    Time spent: 35 minutes    Barb Merino, MD [facility] Pager [phone]

## 2022-05-28 NOTE — Plan of Care (Signed)
Patient is awake in bed with c/o gas, will give PRN relief and encourage ambulation

## 2022-05-28 NOTE — Consult Note (Addendum)
Referring Provider: Barb Merino, MD Primary Care Physician:  [name], MD Primary Gastroenterologist:  Dr. [facility]  Reason for Consultation:  diverticulitis, ileus, constipation  HPI: [name] is a 71 y.o. female  with history of sigmoid diverticulosis and previous history of diverticulitis, history of in situ breast cancer, hypertension and insomnia, who presented to the ED 05/25/2022 complaining of 3 days gradual onset abdominal pain with associated nausea, vomiting, constipation.  Hemodynamically stable in the ED leukocytosis.  CT A/P revealed uncomplicated sigmoid diverticulitis.  Patient admitted.  Improved, tolerating full liquid diet, but has not had a bowel movement since admission.  Complains of gaseous distention.  KUB 05/27/2022 shows ileus and stool burden.  Patient given 1 dose of MiraLAX and 1 dose repeat Dulcolax suppository.  Receiving Cipro and Flagyl.  Seen and examined at bedside this morning.  States she had onset of vomiting and abdominal cramps 04/21/2022, developed sore throat, bronchitis, pneumonia.  Was treated with antibiotics for 2 weeks.  2 days later developed the abdominal pain she is presently experiencing and came to the ED for evaluation.  She has history of diverticulitis and suspected a recurrence.  She reports diffuse abdominal pain which is currently a 2 out of 10 in severity, 7 out of 10 when pain medication wears off.  CT has been ordered but patient does not think she can tolerate the contrast due to nausea.  She denies any recent vomiting but has been dry heaving.  Has been keeping clear liquids down.  Has felt short of breath during admission and is currently on 3 L supplemental oxygen per nasal cannula.  Does not use oxygen at home.  Patient reports she has a bowel movement nearly every day at home.  Last bowel movement 05/27/2022 at 2 AM. She is passing flatus.  History of colon cancer, father, diagnosed in his 77s  (?).  Last colonoscopy 07/10/2017 showed diverticulosis in the sigmoid and distal descending colon, otherwise normal, recall 5 years.   Past Medical History:  Diagnosis Date   Abnormal liver function test    Arthritis    knees, hands & hips, back    Breast cancer ([place]) 2019   Left Breast Cancer   Cancer ([place])    DCIS- L breast   Complication of anesthesia    Depression    post partum, also related to her in her 20's   Dyslipidemia    Esophageal spasm    rare occurence , /w ? reflux, no treatment or med. thus far   Headache    tension , sporadic    Hypertension    Hypertriglyceridemia    IBS (irritable bowel syndrome)    PONV (postoperative nausea and vomiting)    Sleep disturbance     Past Surgical History:  Procedure Laterality Date   BREAST BIOPSY Left 08/2017   BREAST BIOPSY Right 09/05/2018   BREAST BIOPSY Left 07/25/2020   BREAST BIOPSY Right 07/25/2020   x2   BREAST LUMPECTOMY Left 09/2017   BREAST LUMPECTOMY WITH RADIOACTIVE SEED LOCALIZATION Left 09/25/2017   Procedure: LEFT BREAST PARTIAL MASTECTOMY WITH RADIOACTIVE SEED LOCALIZATION ERAS PATHWAY;  Surgeon: Coralie Keens, MD;  Location: [facility];  Service: General;  Laterality: Left;   BREAST REDUCTION SURGERY     CARDIOVASCULAR STRESS TEST     CESAREAN SECTION  1984   KNEE ARTHROSCOPY Right 2004   REDUCTION MAMMAPLASTY Bilateral 1997   ROOT CANAL     SHOULDER SURGERY Right    2012-  arthroscopy   TONSILLECTOMY      Prior to Admission medications   Medication Sig Start Date End Date Taking? Authorizing Provider  ALEVE 220 MG tablet Take 220-440 mg by mouth 2 (two) times daily as needed (for pain).   Yes [provider]  amLODipine (NORVASC) 2.5 MG tablet Take 2.5 mg by mouth daily after supper. 04/13/21  Yes [provider]  EXCEDRIN TENSION HEADACHE 500-65 MG TABS per tablet Take 1 tablet by mouth every 6 (six) hours as needed (for headaches).   Yes [provider]   Glucosamine-Chondroitin-MSM [phone] MG TABS Take 2 tablets by mouth daily.   Yes [provider]  irbesartan-hydrochlorothiazide (AVALIDE) 150-12.5 MG tablet Take 1 tablet by mouth daily. 12/21/18  Yes [provider]  Multiple Vitamins-Minerals (MULTIVITAMIN WOMEN 50+) TABS Take 1 tablet by mouth daily with breakfast.   Yes [provider]  omeprazole (PRILOSEC) 20 MG capsule Take 20 mg by mouth daily before breakfast. 03/08/22  Yes [provider]  Probiotic Product (PROBIOTIC DAILY PO) Take 1 capsule by mouth daily.   Yes [provider]  Simethicone (PHAZYME) 180 MG CAPS Take 180-360 mg by mouth daily as needed (for gas).   Yes [provider]  zaleplon (SONATA) 10 MG capsule Take 10 mg by mouth at bedtime as needed for sleep.   Yes [provider]  diclofenac sodium (VOLTAREN) 1 % GEL Apply 2 g topically 4 (four) times daily.    [provider]  eszopiclone (LUNESTA) 2 MG TABS tablet Take 2 mg by mouth at bedtime as needed. Patient not taking: Reported on 05/25/2022 04/25/22   [provider]  ondansetron (ZOFRAN ODT) 4 MG disintegrating tablet Take 1 tablet (4 mg total) by mouth every 8 (eight) hours as needed for nausea or vomiting. 02/08/21   Fredia Sorrow, MD  traMADol (ULTRAM) 50 MG tablet Take 1 tablet (50 mg total) by mouth every 6 (six) hours as needed. Patient not taking: Reported on 05/25/2022 02/08/21   Fredia Sorrow, MD    Scheduled Meds:  acidophilus  1 capsule Oral TID   amLODipine  2.5 mg Oral Daily   enoxaparin (LOVENOX) injection  40 mg Subcutaneous Q24H   iohexol  500 mL Oral Q1H   iohexol       polyethylene glycol  17 g Oral BID   Continuous Infusions:  sodium chloride 125 mL/hr at 05/28/22 6767   ciprofloxacin 400 mg (05/28/22 0825)   metronidazole 500 mg (05/28/22 0832)   promethazine (PHENERGAN) injection (IM or IVPB) 12.5 mg (05/28/22 1008)   PRN Meds:.acetaminophen **OR**  acetaminophen, hydrALAZINE, HYDROmorphone (DILAUDID) injection, iohexol, lip balm, oxyCODONE-acetaminophen, promethazine (PHENERGAN) injection (IM or IVPB), simethicone, zolpidem  Allergies as of 05/25/2022 - Review Complete 05/25/2022  Allergen Reaction Noted   Tetracycline Nausea And Vomiting and Other (See Comments) 05/25/2022   Candida albicans Nausea And Vomiting and Other (See Comments) 05/25/2022   Cephalexin Nausea And Vomiting and Other (See Comments) 09/17/2013   Yeast Nausea And Vomiting and Other (See Comments)    Fenofibrate Other (See Comments) 02/16/2017   Glycerine [glycerin] Nausea And Vomiting 09/17/2013   Levofloxacin Other (See Comments) 05/25/2022   Lunesta [eszopiclone] Other (See Comments) 05/25/2022   Tetracyclines & related Nausea And Vomiting 09/17/2013    Family History  Problem Relation Age of Onset   Cancer Mother    Breast cancer Mother 59   Cancer Father     Social History   Socioeconomic History   Marital  status: Married    Spouse name: Not on file   Number of children: Not on file   Years of education: Not on file   Highest education level: Not on file  Occupational History   Not on file  Tobacco Use   Smoking status: Never   Smokeless tobacco: Never  Vaping Use   Vaping Use: Not on file  Substance and Sexual Activity   Alcohol use: No   Drug use: No   Sexual activity: Yes  Other Topics Concern   Not on file  Social History Narrative   Not on file   Social Determinants of Health   Financial Resource Strain: Not on file  Food Insecurity: No Food Insecurity (05/25/2022)   Hunger Vital Sign    Worried About Running Out of Food in the Last Year: Never true    Ran Out of Food in the Last Year: Never true  Transportation Needs: No Transportation Needs (05/25/2022)   PRAPARE - Hydrologist (Medical): No    Lack of Transportation (Non-Medical): No  Physical Activity: Not on file  Stress: Not on file   Social Connections: Not on file  Intimate Partner Violence: Not At Risk (05/25/2022)   Humiliation, Afraid, Rape, and Kick questionnaire    Fear of Current or Ex-Partner: No    Emotionally Abused: No    Physically Abused: No    Sexually Abused: No    Review of Systems: Review of Systems  Constitutional:  Negative for chills and fever.  HENT:  Negative for sinus pain.   Eyes:  Negative for discharge and redness.  Respiratory:  Positive for shortness of breath. Negative for wheezing and stridor.   Cardiovascular:  Negative for chest pain and leg swelling.  Gastrointestinal:  Positive for abdominal pain, constipation, nausea and vomiting. Negative for blood in stool, diarrhea, heartburn and melena.  Genitourinary:  Negative for dysuria and urgency.  Musculoskeletal:  Negative for falls.  Skin:  Negative for itching and rash.  Neurological:  Negative for seizures and loss of consciousness.  Endo/Heme/Allergies:  Negative for environmental allergies and polydipsia.  Psychiatric/Behavioral:  Negative for memory loss. The patient does not have insomnia.      Physical Exam: Physical Exam Vitals reviewed.  Constitutional:      General: She is not in acute distress.    Appearance: Normal appearance.  HENT:     Head: Normocephalic and atraumatic.     Right Ear: External ear normal.     Left Ear: External ear normal.     Nose: Nose normal.     Comments: Nasal cannula in place    Mouth/Throat:     Mouth: Mucous membranes are moist.     Pharynx: Oropharynx is clear.  Eyes:     General: No scleral icterus.    Extraocular Movements: Extraocular movements intact.     Conjunctiva/sclera: Conjunctivae normal.  Cardiovascular:     Rate and Rhythm: Normal rate and regular rhythm.     Pulses: Normal pulses.     Heart sounds: Normal heart sounds.  Pulmonary:     Effort: Pulmonary effort is normal.     Breath sounds: Normal breath sounds.  Abdominal:     General: Bowel sounds are normal.  There is distension.     Palpations: Abdomen is soft.     Tenderness: There is no abdominal tenderness. There is no guarding or rebound.  Musculoskeletal:     Cervical back: Normal range of motion and  neck supple.     Right lower leg: No edema.     Left lower leg: No edema.  Skin:    General: Skin is warm and dry.  Neurological:     General: No focal deficit present.     Mental Status: She is alert and oriented to person, place, and time.  Psychiatric:        Mood and Affect: Mood normal.        Behavior: Behavior normal.      Vital signs: Vitals:   05/28/22 0613 05/28/22 0700  BP: 123/72   Pulse: 91   Resp: 18   Temp: 100 F (37.8 C) 98.5 F (36.9 C)  SpO2: 91%    Last BM Date : 05/22/22    GI:  Lab Results: Recent Labs    05/26/22 0515 05/27/22 0536  WBC 12.7* 9.8  HGB 12.5 11.5*  HCT 36.3 34.1*  PLT 261 249   BMET Recent Labs    05/26/22 0515 05/27/22 0536  NA 136 136  K 3.2* 4.2  CL 106 108  CO2 25 24  GLUCOSE 104* 112*  BUN 11 11  CREATININE 0.67 0.59  CALCIUM 7.5* 7.5*   LFT Recent Labs    05/26/22 0515  PROT 5.8*  ALBUMIN 3.0*  AST 20  ALT 22  ALKPHOS 59  BILITOT 0.9   PT/INR No results for input(s): "LABPROT", "INR" in the last 72 hours.   Studies/Results: DG Abd 1 View  Result Date: 05/27/2022 CLINICAL DATA:  Abdominal distention. EXAM: ABDOMEN - 1 VIEW COMPARISON:  02/10/2021 FINDINGS: Mild gaseous distention of small bowel up to 2.3 cm diameter without frankly obstructive pattern. Prominent stool volume noted throughout the colon. Mild gaseous distention of the stomach. IMPRESSION: Prominent stool volume throughout the colon. Mild gaseous distention of central small bowel loops. Component of underlying ileus would be a consideration. Electronically Signed   By: Misty Stanley M.D.   On: 05/27/2022 09:16    Impression: Acute uncomplicated diverticulitis, abdominal distention, constipation/ileus - Labs 05/27/2022 show hemoglobin  11.5, no leukocytosis, platelets 249 - Normal LFTs 05/26/2022  - CT A/P 05/25/2022 1. Findings are concerning for acute sigmoid diverticulitis. No diverticular abscess or signs of frank perforation are noted at this time. 2. Indeterminate lesion associated with the fundus of the gallbladder suspicious for small soft tissue neoplasm, although this could alternatively represent an adherent gallstone. Further evaluation with follow-up nonemergent outpatient abdominal MRI with and without IV gadolinium with MRCP should be considered after resolution of the patient's acute illness. - Abdominal x-ray 05/27/2022 showed prominent stool throughout the colon, mild gaseous distention of central small bowel loops.  Component of underlying ileus is a consideration.  - Repeat CT A/P (without oral contrast) 05/28/2022 1. Wall thickening in the proximal sigmoid colon and in the distal sigmoid colon, with adjacent infiltrative fluid signal in the paracolic adipose tissue. Appearance favors colitis or active diverticulitis. No extraluminal gas or abscess. 2. New small bilateral pleural effusions with mild lower lobe atelectasis. 3. New mild ascites. 4. Mild wall thickening in the dome of the urinary bladder, likely secondary to the adjacent inflammatory findings in the sigmoid colon. 5. Small to moderate-sized type 3 hiatal hernia. 6. Mild cardiomegaly. 7. Bilateral nonobstructive nephrolithiasis. 8. Accentuated density anteriorly along the gallbladder fundus, possibly from hyperplastic cholecystosis, adherent gallstone, or Phrygian cap. Similar appearance back through 12/18/2010 indicates that this is unlikely to be a malignancy. 9. Mild atherosclerotic calcification in the abdominal aorta and left  renal artery.  Plan: No bowel obstruction noted on repeat CT though she does have abdominal distention on exam, passing flatus but not stool. CT findings consistent with diverticulitis/colitis. Case  discussed with Dr. Alessandra Bevels, will also see patient today. Continue antibiotic therapy. Continue supportive care.  [facility] will follow.   LOS: 2 days   Angelique Holm  PA-C 05/28/2022, 10:34 AM  Contact #  [phone]

## 2022-05-28 NOTE — Progress Notes (Signed)
Mobility Specialist - Progress Note Pt has refused mobility 3 times today, pt has claimed that she does not feel up for ambulating. Will check back in tomorrow morning.  Roderick Pee Mobility Specialist

## 2022-05-28 NOTE — Progress Notes (Signed)
Oral contrast ordered for the patient in preparation of CT abdomen. Patient refuses to drink, provider made aware

## 2022-05-28 NOTE — Progress Notes (Signed)
  Transition of Care [facility]) Screening Note   Patient Details  Name: [name] Date of Birth: [birth year]   [facility]) CM/SW Contact:    Vassie Moselle, LCSW Phone Number: 05/28/2022, 9:22 AM    Transition of Care Department [facility] has reviewed patient and no TOC needs have been identified at this time. We will continue to monitor patient advancement through interdisciplinary progression rounds. If new patient transition needs arise, please place a TOC consult.

## 2022-05-29 DIAGNOSIS — K5792 Diverticulitis of intestine, part unspecified, without perforation or abscess without bleeding: Secondary | ICD-10-CM | POA: Diagnosis not present

## 2022-05-29 LAB — COMPREHENSIVE METABOLIC PANEL
ALT: 19 U/L (ref 0–44)
AST: 23 U/L (ref 15–41)
Albumin: 2.6 g/dL — ABNORMAL LOW (ref 3.5–5.0)
Alkaline Phosphatase: 220 U/L — ABNORMAL HIGH (ref 38–126)
Anion gap: 7 (ref 5–15)
BUN: 6 mg/dL — ABNORMAL LOW (ref 8–23)
CO2: 23 mmol/L (ref 22–32)
Calcium: 7.8 mg/dL — ABNORMAL LOW (ref 8.9–10.3)
Chloride: 107 mmol/L (ref 98–111)
Creatinine, Ser: 0.71 mg/dL (ref 0.44–1.00)
GFR, Estimated: >60 mL/min (ref >60)
Glucose, Bld: 115 mg/dL — ABNORMAL HIGH (ref 70–99)
Potassium: 3.9 mmol/L (ref 3.5–5.1)
Sodium: 137 mmol/L (ref 135–145)
Total Bilirubin: 0.9 mg/dL (ref 0.3–1.2)
Total Protein: 5.5 g/dL — ABNORMAL LOW (ref 6.5–8.1)

## 2022-05-29 LAB — CBC WITH DIFFERENTIAL/PLATELET
Abs Immature Granulocytes: 0.14 10*3/uL — ABNORMAL HIGH (ref 0.00–0.07)
Basophils Absolute: 0 10*3/uL (ref 0.0–0.1)
Basophils Relative: 0 %
Eosinophils Absolute: 0.1 10*3/uL (ref 0.0–0.5)
Eosinophils Relative: 1 %
HCT: 35 % — ABNORMAL LOW (ref 36.0–46.0)
Hemoglobin: 11.7 g/dL — ABNORMAL LOW (ref 12.0–15.0)
Immature Granulocytes: 1 %
Lymphocytes Relative: 17 %
Lymphs Abs: 1.8 10*3/uL (ref 0.7–4.0)
MCH: 31.3 pg (ref 26.0–34.0)
MCHC: 33.4 g/dL (ref 30.0–36.0)
MCV: 93.6 fL (ref 80.0–100.0)
Monocytes Absolute: 0.8 10*3/uL (ref 0.1–1.0)
Monocytes Relative: 8 %
Neutro Abs: 7.2 10*3/uL (ref 1.7–7.7)
Neutrophils Relative %: 73 %
Platelets: 271 10*3/uL (ref 150–400)
RBC: 3.74 MIL/uL — ABNORMAL LOW (ref 3.87–5.11)
RDW: 12.6 % (ref 11.5–15.5)
WBC: 10.1 10*3/uL (ref 4.0–10.5)
nRBC: 0.2 % (ref 0.0–0.2)

## 2022-05-29 LAB — PHOSPHORUS: Phosphorus: 2.8 mg/dL (ref 2.5–4.6)

## 2022-05-29 LAB — GLUCOSE, CAPILLARY: Glucose-Capillary: 113 mg/dL — ABNORMAL HIGH (ref 70–99)

## 2022-05-29 LAB — MAGNESIUM: Magnesium: 1.9 mg/dL (ref 1.7–2.4)

## 2022-05-29 MED ORDER — FLEET ENEMA 7-19 GM/118ML RE ENEM
1.0000 | ENEMA | Freq: Once | RECTAL | Status: AC
  Administered 2022-05-29: 1 via RECTAL
  Filled 2022-05-29: qty 1

## 2022-05-29 MED ORDER — AMOXICILLIN-POT CLAVULANATE 875-125 MG PO TABS: 1.0000 | ORAL_TABLET | Freq: Two times a day (BID) | ORAL | 0 refills | Status: AC

## 2022-05-29 MED ORDER — POLYETHYLENE GLYCOL 3350 17 G PO PACK: 17.0000 g | PACK | Freq: Two times a day (BID) | ORAL | 0 refills | Status: DC

## 2022-05-29 MED ORDER — OXYCODONE-ACETAMINOPHEN 5-325 MG PO TABS: 1.0000 | ORAL_TABLET | Freq: Three times a day (TID) | ORAL | 0 refills | Status: DC | PRN

## 2022-05-29 MED ORDER — DOCUSATE SODIUM 100 MG PO CAPS: 100.0000 mg | ORAL_CAPSULE | Freq: Every day | ORAL | 0 refills | Status: DC

## 2022-05-29 MED ORDER — DOCUSATE SODIUM 100 MG PO CAPS
100.0000 mg | ORAL_CAPSULE | Freq: Every day | ORAL | Status: DC
  Administered 2022-05-29 – 2022-05-30 (×2): 100 mg via ORAL
  Filled 2022-05-29 (×2): qty 1

## 2022-05-29 MED ORDER — AMOXICILLIN-POT CLAVULANATE 875-125 MG PO TABS
1.0000 | ORAL_TABLET | Freq: Two times a day (BID) | ORAL | Status: DC
  Administered 2022-05-29 – 2022-05-31 (×5): 1 via ORAL
  Filled 2022-05-29 (×5): qty 1

## 2022-05-29 MED ORDER — LACTULOSE 10 GM/15ML PO SOLN
20.0000 g | Freq: Once | ORAL | Status: AC
  Administered 2022-05-29: 20 g via ORAL
  Filled 2022-05-29: qty 30

## 2022-05-29 NOTE — Progress Notes (Signed)
Patient with c/o abdominal bloating and pain. Patient has received several medications to induce bowel with very little success. She is now nauseous and had 1 episode of vomitus

## 2022-05-29 NOTE — Plan of Care (Signed)

## 2022-05-29 NOTE — Care Management Important Message (Signed)
Important Message  Patient Details IM Letter given. Name: [name] MRN: [id number] Date of Birth: 04-19-1951   Medicare Important Message Given:  Yes     Kerin Salen 05/29/2022, 10:34 AM

## 2022-05-29 NOTE — Plan of Care (Signed)
Patient is awake in bed and stable, reports minimal pain this morning. Diet advanced per order.

## 2022-05-29 NOTE — Progress Notes (Signed)
[facility]astroenterology Progress Note  [name] 71 y.o. Nov 07, 1950   Subjective: Patient seen and examined laying in bed.  Notes she is feeling much better today.  She has a good appetite.  Starting  soft diet this morning.    Objective: Vital signs in last 24 hours: Vitals:   05/28/22 2006 05/29/22 0552  BP: 127/67 138/72  Pulse: 85 86  Resp: 16 20  Temp: 98.1 F (36.7 C) 98.7 F (37.1 C)  SpO2: 91% 93%    Physical Exam:  General:  Alert, cooperative, no distress, appears stated age  Head:  Normocephalic, without obvious abnormality, atraumatic  Eyes:  Anicteric sclera, EOM's intact  Lungs:   Clear to auscultation bilaterally, respirations unlabored  Heart:  Regular rate and rhythm, S1, S2 normal  Abdomen:   Soft, mild lower abdominal tenderness, bowel sounds active all four quadrants,  no masses,   Extremities: Extremities normal, atraumatic, no  edema  Pulses: 2+ and symmetric    Lab Results: Recent Labs    05/27/22 0536 05/29/22 0545  NA 136 137  K 4.2 3.9  CL 108 107  CO2 24 23  GLUCOSE 112* 115*  BUN 11 6*  CREATININE 0.59 0.71  CALCIUM 7.5* 7.8*  MG 2.1 1.9  PHOS 2.1* 2.8   Recent Labs    05/29/22 0545  AST 23  ALT 19  ALKPHOS 220*  BILITOT 0.9  PROT 5.5*  ALBUMIN 2.6*   Recent Labs    05/27/22 0536 05/29/22 0545  WBC 9.8 10.1  NEUTROABS 7.3 7.2  HGB 11.5* 11.7*  HCT 34.1* 35.0*  MCV 93.9 93.6  PLT 249 271   No results for input(s): "LABPROT", "INR" in the last 72 hours.    Assessment Acute sigmoid diverticulitis Ileus Constipation  Hgb 11.5, WBC 9.8, AST/ALT 23/19, ALP 220  Is currently taking MiraLAX twice daily.  Still has not had a bowel movement yesterday or today.  Abdominal pain much improved.  Tolerating diet well.   Plan: Continue MiraLAX twice daily for constipation Continue Augmentin for total of 10 days Advance diet as tolerated [facility] will sign off, patient will need to follow-up for  colonoscopy in 6 to 8 weeks.  Arvella Nigh Paul Torpey PA-C 05/29/2022, 11:26 AM  Contact #  [phone]

## 2022-05-29 NOTE — Progress Notes (Signed)
PROGRESS NOTE    [name]  ZOX:[id number] DOB: March 05, 1951 DOA: 05/25/2022 PCP: [name], MD    Brief Narrative:  71 year old with history of sigmoid diverticulosis and previous history of diverticulitis, history of in situ breast cancer, hypertension and insomnia presented to the ED with 3 days of gradual onset abdominal pain associated nausea , retching and constipation.  In the emergency room hemodynamically stable.  WBC count 18,000.  CT scan abdomen pelvis with uncomplicated sigmoid diverticulitis.  Admitted with significant findings, nausea vomiting and leukocytosis. Abdomen pain and nausea improved, now she has constipation and gaseous distention.   Assessment & Plan:   Acute uncomplicated diverticulitis, abdominal pain. Pain and nausea improved.  No bowel movement yet.  Gaseous distention and constipation. Continue maintenance IV fluids.  Advance to soft diet. She has been taking MiraLAX twice daily, Dulcolax suppository and a Fleet enema without much success. We will start patient on lactulose, 1 dose now.  We will repeat further in the evening if no bowel movement. Repeat CT scan was stable. Seen by gastroenterology. Treated with ciprofloxacin and Flagyl for 5 days, will change to Augmentin to complete 10 days of therapy. Follow-up colonoscopy at [facility].  Abnormal CT scan of the gallbladder: Unknown significance.  LFTs normal.  Discussed with patient about doing gallbladder MRI and she declined and stated "I will just focus on the diverticulitis that brought me here".  Essential hypertension: Amlodipine.  Stable.  Insomnia: On multiple medications.  Stable.  Hypokalemia/hypophosphatemia: Replaced with improvement.  Advance diet.  Discontinue IV fluids.  Change to oral antibiotics.  Aggressive bowel regimen today.  Home if adequate bowel movements.   DVT prophylaxis: enoxaparin (LOVENOX) injection 40 mg Start: 05/25/22 2200   Code Status: Full  code Family Communication: None at the bedside Disposition Plan: Status is: Inpatient.  Remains inpatient.  No bowel movements.  Consultants:  Gastroenterology  Procedures:  None  Antimicrobials:  Cipro and Flagyl 10/13-10/17 Augmentin 10/17---   Subjective:  Patient seen and examined.  Pain has much improved.  She is tolerated full liquid diet and wants to see if she can order some real meal.  After multiple medications and laxatives, enema still has no bowel movement.  Feels distended but without pain.  Passing flatus.  Objective: Vitals:   05/28/22 1330 05/28/22 2006 05/29/22 0552 05/29/22 1316  BP: 128/77 127/67 138/72 139/78  Pulse: 87 85 86 90  Resp:  '16 20 19  '$ Temp: 100 F (37.8 C) 98.1 F (36.7 C) 98.7 F (37.1 C) 97.7 F (36.5 C)  TempSrc: Oral   Oral  SpO2: 93% 91% 93% 93%  Weight:      Height:        Intake/Output Summary (Last 24 hours) at 05/29/2022 1430 Last data filed at 05/29/2022 0900 Gross per 24 hour  Intake 1333.6 ml  Output --  Net 1333.6 ml   Filed Weights   05/25/22 0217 05/26/22 2118  Weight: 60.8 kg 65 kg    Examination:  General: Mildly anxious but comfortable.  Talkative today. Cardiovascular: S1-S2 normal.  Regular rate rhythm.  No added sounds. Respiratory: Bilateral clear. Gastrointestinal: Mildly distended but nontender.  Bowel sound present. Ext: No swelling.  No edema or cyanosis. Neuro: Alert oriented.  No focal deficits. Musculoskeletal: No deformities.    Data Reviewed: I have personally reviewed following labs and imaging studies  CBC: Recent Labs  Lab 05/25/22 0229 05/26/22 0515 05/27/22 0536 05/29/22 0545  WBC 18.0* 12.7* 9.8 10.1  NEUTROABS  --   --  7.3 7.2  HGB 16.4* 12.5 11.5* 11.7*  HCT 45.7 36.3 34.1* 35.0*  MCV 87.7 92.4 93.9 93.6  PLT 374 261 249 563   Basic Metabolic Panel: Recent Labs  Lab 05/25/22 0229 05/26/22 0515 05/27/22 0536 05/29/22 0545  NA 132* 136 136 137  K 3.5 3.2* 4.2 3.9   CL 96* 106 108 107  CO2 21* '25 24 23  '$ GLUCOSE 155* 104* 112* 115*  BUN '16 11 11 '$ 6*  CREATININE 0.82 0.67 0.59 0.71  CALCIUM 9.6 7.5* 7.5* 7.8*  MG  --  2.0 2.1 1.9  PHOS  --   --  2.1* 2.8   GFR: Estimated Creatinine Clearance: 52.7 mL/min (by C-G formula based on SCr of 0.71 mg/dL). Liver Function Tests: Recent Labs  Lab 05/25/22 0229 05/26/22 0515 05/29/22 0545  AST '27 20 23  '$ ALT 34 22 19  ALKPHOS 85 59 220*  BILITOT 1.0 0.9 0.9  PROT 7.6 5.8* 5.5*  ALBUMIN 4.3 3.0* 2.6*   Recent Labs  Lab 05/25/22 0229  LIPASE 14   No results for input(s): "AMMONIA" in the last 168 hours. Coagulation Profile: No results for input(s): "INR", "PROTIME" in the last 168 hours. Cardiac Enzymes: No results for input(s): "CKTOTAL", "CKMB", "CKMBINDEX", "TROPONINI" in the last 168 hours. BNP (last 3 results) No results for input(s): "PROBNP" in the last 8760 hours. HbA1C: No results for input(s): "HGBA1C" in the last 72 hours. CBG: Recent Labs  Lab 05/26/22 0747 05/27/22 0736 05/28/22 0728 05/29/22 0846  GLUCAP 110* 94 95 113*   Lipid Profile: No results for input(s): "CHOL", "HDL", "LDLCALC", "TRIG", "CHOLHDL", "LDLDIRECT" in the last 72 hours. Thyroid Function Tests: No results for input(s): "TSH", "T4TOTAL", "FREET4", "T3FREE", "THYROIDAB" in the last 72 hours. Anemia Panel: No results for input(s): "VITAMINB12", "FOLATE", "FERRITIN", "TIBC", "IRON", "RETICCTPCT" in the last 72 hours. Sepsis Labs: No results for input(s): "PROCALCITON", "LATICACIDVEN" in the last 168 hours.  No results found for this or any previous visit (from the past 240 hour(s)).       Radiology Studies: CT ABDOMEN PELVIS W CONTRAST  Result Date: 05/28/2022 CLINICAL DATA:  Persistent abdominal pain with distention and ileus. Diverticulitis. EXAM: CT ABDOMEN AND PELVIS WITH CONTRAST TECHNIQUE: Multidetector CT imaging of the abdomen and pelvis was performed using the standard protocol following  bolus administration of intravenous contrast. RADIATION DOSE REDUCTION: This exam was performed according to the departmental dose-optimization program which includes automated exposure control, adjustment of the mA and/or kV according to patient size and/or use of iterative reconstruction technique. CONTRAST:  132m OMNIPAQUE IOHEXOL 300 MG/ML  SOLN COMPARISON:  06/25/2022 FINDINGS: Lower chest: New small bilateral pleural effusions with associated mild lower lobe atelectasis bilaterally. Mild cardiomegaly. Small to moderate-sized type 3 hiatal hernia. Mild scarring or atelectasis in the right middle lobe. Hepatobiliary: There is a suggestion of accentuated density anteriorly along the gallbladder fundus, possibly from hyperplastic cholecystosis, adherent gallstone, or Phrygian cap. Similar appearance back through 12/18/2010 indicates that this is unlikely to be a malignancy. Accentuated density in the gallbladder probably from vicarious contrast excretion. No focal hepatic parenchymal lesion is observed. Pancreas: Unremarkable Spleen: Unremarkable Adrenals/Urinary Tract: Both adrenal glands appear normal. 3 mm right kidney lower pole nonobstructive renal calculus on image 51 series 4. 4 mm left mid to lower kidney nonobstructive renal calculus, image 54 series 4. 6 mm left mid kidney hypodense lesion on image 37 of series 2 is unchanged from 12/18/2010 and compatible with a benign cyst. No further workup  is warranted. No hydronephrosis or hydroureter. The kidneys appear otherwise unremarkable. Mild wall thickening in the dome of the urinary bladder as on image 64 series 5, likely secondary to the adjacent inflammatory findings in the sigmoid colon. Stomach/Bowel: Fluid-filled proximal large bowel with air-fluid levels. There is descending and sigmoid colon diverticulosis with some wall thickening in the proximal sigmoid colon on image 41 series 4, and suspected inflammation in the distal sigmoid colon potentially  from colitis or active diverticulitis. No extraluminal gas or abscess is observed. Infiltrative fluid signal is most notable along the distal sigmoid colon and paracolic adipose tissue for example on image 57 series 2. No appendiceal inflammation observed. No dilated small bowel. Vascular/Lymphatic: Mild atherosclerotic calcification in the left renal artery and abdominal aorta. The celiac trunk, SMA, and inferior mesenteric artery appear patent. Scattered retroperitoneal lymph nodes are probably reactive and include a 0.7 cm right periaortic lymph node on image 31 series 2. Reproductive: Unremarkable Other: Scattered mild ascites, new compared to previous. Musculoskeletal: Lower lumbar degenerative facet arthropathy. IMPRESSION: 1. Wall thickening in the proximal sigmoid colon and in the distal sigmoid colon, with adjacent infiltrative fluid signal in the paracolic adipose tissue. Appearance favors colitis or active diverticulitis. No extraluminal gas or abscess. 2. New small bilateral pleural effusions with mild lower lobe atelectasis. 3. New mild ascites. 4. Mild wall thickening in the dome of the urinary bladder, likely secondary to the adjacent inflammatory findings in the sigmoid colon. 5. Small to moderate-sized type 3 hiatal hernia. 6. Mild cardiomegaly. 7. Bilateral nonobstructive nephrolithiasis. 8. Accentuated density anteriorly along the gallbladder fundus, possibly from hyperplastic cholecystosis, adherent gallstone, or Phrygian cap. Similar appearance back through 12/18/2010 indicates that this is unlikely to be a malignancy. 9. Mild atherosclerotic calcification in the abdominal aorta and left renal artery. Aortic Atherosclerosis (ICD10-I70.0). Electronically Signed   By: Van Clines M.D.   On: 05/28/2022 13:02        Scheduled Meds:  acidophilus  1 capsule Oral TID   amLODipine  2.5 mg Oral Daily   amoxicillin-clavulanate  1 tablet Oral Q12H   docusate sodium  100 mg Oral Daily    enoxaparin (LOVENOX) injection  40 mg Subcutaneous Q24H   lactulose  20 g Oral Once   Continuous Infusions:  promethazine (PHENERGAN) injection (IM or IVPB) 12.5 mg (05/28/22 1008)     LOS: 3 days    Time spent: 35 minutes    Barb Merino, MD [facility] Pager [phone]

## 2022-05-30 LAB — GLUCOSE, CAPILLARY: Glucose-Capillary: 122 mg/dL — ABNORMAL HIGH (ref 70–99)

## 2022-05-30 MED ORDER — SODIUM CHLORIDE 0.9 % IV SOLN: INTRAVENOUS | Status: DC

## 2022-05-30 MED ORDER — POLYETHYLENE GLYCOL 3350 17 G PO PACK
17.0000 g | PACK | Freq: Two times a day (BID) | ORAL | Status: DC
  Administered 2022-05-30: 17 g via ORAL
  Filled 2022-05-30 (×2): qty 1

## 2022-05-30 MED ORDER — BISACODYL 10 MG RE SUPP
10.0000 mg | Freq: Once | RECTAL | Status: AC
  Administered 2022-05-30: 10 mg via RECTAL
  Filled 2022-05-30: qty 1

## 2022-05-30 MED ORDER — TRAMADOL HCL 50 MG PO TABS
50.0000 mg | ORAL_TABLET | Freq: Four times a day (QID) | ORAL | Status: DC | PRN
  Administered 2022-05-30: 50 mg via ORAL
  Filled 2022-05-30: qty 1

## 2022-05-30 NOTE — Plan of Care (Signed)

## 2022-05-30 NOTE — Progress Notes (Signed)
Mobility Specialist Cancellation/Refusal Note:   Pt declined mobility at this time. Pt stated she was not in the mood to ambulate today. Will check back as schedule permits.       [facility]

## 2022-05-30 NOTE — Progress Notes (Signed)
PROGRESS NOTE    [name]  UXN:[id number] DOB: 1951-05-21 DOA: 05/25/2022 PCP: [name], MD    Brief Narrative:  71 year old with history of sigmoid diverticulosis and previous history of diverticulitis, history of in situ breast cancer, hypertension and insomnia presented to the ED with 3 days of gradual onset abdominal pain associated nausea , retching and constipation.  In the emergency room hemodynamically stable.  WBC count 18,000.  CT scan abdomen pelvis with uncomplicated sigmoid diverticulitis.  Admitted with significant findings, nausea vomiting and leukocytosis. Abdomen pain and nausea improved, now she has constipation and gaseous distention.   Assessment & Plan:   Acute uncomplicated diverticulitis, abdominal pain. Pain and nausea improved.  No bowel movement yet.  Gaseous distention and constipation. Continue maintenance IV fluids.  We will keep soft diet so she can choose that he can easily eat. She has been taking MiraLAX twice daily, Dulcolax suppository and a Fleet enema without much success. Did not tolerate lactulose. Repeat dose of MiraLAX and Dulcolax suppository today.  Mobilize in the hallway. Repeat CT scan was stable. Seen by gastroenterology. Treated with ciprofloxacin and Flagyl for 5 days, will change to Augmentin to complete 10 days of therapy. Follow-up colonoscopy at [facility].  Abnormal CT scan of the gallbladder: Unknown significance.  LFTs normal.  Discussed with patient about doing gallbladder MRI and she declined and stated "I will just focus on the diverticulitis that brought me here".  Essential hypertension: Amlodipine.  Stable.  Insomnia: On multiple medications.  Stable.  Hypokalemia/hypophosphatemia: Replaced with improvement.  Patient is still with nausea and abdominal distention with not having bowel movement.  Will need to work on having bowel movement before discharge.   DVT prophylaxis: enoxaparin (LOVENOX)  injection 40 mg Start: 05/25/22 2200   Code Status: Full code Family Communication: None at the bedside Disposition Plan: Status is: Inpatient.  Remains inpatient.  No bowel movements.  Consultants:  Gastroenterology  Procedures:  None  Antimicrobials:  Cipro and Flagyl 10/13-10/17 Augmentin 10/17---   Subjective:  Seen and examined.  Pain is better and trying to eat some food.  She had 1 or 2 small pellets like bowel movements overnight but he still feels distended.  Objective: Vitals:   05/29/22 1316 05/29/22 2016 05/30/22 0522 05/30/22 1050  BP: 139/78 (!) 141/81 128/79 131/81  Pulse: 90 86 80 86  Resp: '19 18 19 16  '$ Temp: 97.7 F (36.5 C) 99.8 F (37.7 C) 98.5 F (36.9 C) 97.9 F (36.6 C)  TempSrc: Oral Oral  Oral  SpO2: 93% 94% 96% 93%  Weight:      Height:        Intake/Output Summary (Last 24 hours) at 05/30/2022 1342 Last data filed at 05/30/2022 1007 Gross per 24 hour  Intake 220 ml  Output --  Net 220 ml   Filed Weights   05/25/22 0217 05/26/22 2118  Weight: 60.8 kg 65 kg    Examination:  General: Looks comfortable at rest.  Cardiovascular: S1-S2 normal.  Regular rate rhythm.  No added sounds. Respiratory: Bilateral clear. Gastrointestinal: Mildly distended and mild diffuse tenderness.  No rigidity or guarding.  Bowel sound present. Ext: No swelling.  No edema or cyanosis. Neuro: Alert oriented.  No focal deficits. Musculoskeletal: No deformities.    Data Reviewed: I have personally reviewed following labs and imaging studies  CBC: Recent Labs  Lab 05/25/22 0229 05/26/22 0515 05/27/22 0536 05/29/22 0545  WBC 18.0* 12.7* 9.8 10.1  NEUTROABS  --   --  7.3 7.2  HGB 16.4* 12.5 11.5* 11.7*  HCT 45.7 36.3 34.1* 35.0*  MCV 87.7 92.4 93.9 93.6  PLT 374 261 249 829   Basic Metabolic Panel: Recent Labs  Lab 05/25/22 0229 05/26/22 0515 05/27/22 0536 05/29/22 0545  NA 132* 136 136 137  K 3.5 3.2* 4.2 3.9  CL 96* 106 108 107  CO2  21* '25 24 23  '$ GLUCOSE 155* 104* 112* 115*  BUN '16 11 11 '$ 6*  CREATININE 0.82 0.67 0.59 0.71  CALCIUM 9.6 7.5* 7.5* 7.8*  MG  --  2.0 2.1 1.9  PHOS  --   --  2.1* 2.8   GFR: Estimated Creatinine Clearance: 52.7 mL/min (by C-G formula based on SCr of 0.71 mg/dL). Liver Function Tests: Recent Labs  Lab 05/25/22 0229 05/26/22 0515 05/29/22 0545  AST '27 20 23  '$ ALT 34 22 19  ALKPHOS 85 59 220*  BILITOT 1.0 0.9 0.9  PROT 7.6 5.8* 5.5*  ALBUMIN 4.3 3.0* 2.6*   Recent Labs  Lab 05/25/22 0229  LIPASE 14   No results for input(s): "AMMONIA" in the last 168 hours. Coagulation Profile: No results for input(s): "INR", "PROTIME" in the last 168 hours. Cardiac Enzymes: No results for input(s): "CKTOTAL", "CKMB", "CKMBINDEX", "TROPONINI" in the last 168 hours. BNP (last 3 results) No results for input(s): "PROBNP" in the last 8760 hours. HbA1C: No results for input(s): "HGBA1C" in the last 72 hours. CBG: Recent Labs  Lab 05/26/22 0747 05/27/22 0736 05/28/22 0728 05/29/22 0846 05/30/22 0805  GLUCAP 110* 94 95 113* 122*   Lipid Profile: No results for input(s): "CHOL", "HDL", "LDLCALC", "TRIG", "CHOLHDL", "LDLDIRECT" in the last 72 hours. Thyroid Function Tests: No results for input(s): "TSH", "T4TOTAL", "FREET4", "T3FREE", "THYROIDAB" in the last 72 hours. Anemia Panel: No results for input(s): "VITAMINB12", "FOLATE", "FERRITIN", "TIBC", "IRON", "RETICCTPCT" in the last 72 hours. Sepsis Labs: No results for input(s): "PROCALCITON", "LATICACIDVEN" in the last 168 hours.  No results found for this or any previous visit (from the past 240 hour(s)).       Radiology Studies: No results found.      Scheduled Meds:  acidophilus  1 capsule Oral TID   amLODipine  2.5 mg Oral Daily   amoxicillin-clavulanate  1 tablet Oral Q12H   docusate sodium  100 mg Oral Daily   enoxaparin (LOVENOX) injection  40 mg Subcutaneous Q24H   polyethylene glycol  17 g Oral BID   Continuous  Infusions:  sodium chloride     promethazine (PHENERGAN) injection (IM or IVPB) 12.5 mg (05/30/22 1308)     LOS: 4 days    Time spent: 35 minutes    Barb Merino, MD [facility] Pager [phone]

## 2022-05-31 LAB — GLUCOSE, CAPILLARY: Glucose-Capillary: 90 mg/dL (ref 70–99)

## 2022-05-31 MED ORDER — POLYETHYLENE GLYCOL 3350 17 G PO PACK: 17.0000 g | PACK | Freq: Every day | ORAL | 0 refills | Status: AC | PRN

## 2022-05-31 MED ORDER — TRAMADOL HCL 50 MG PO TABS: 50.0000 mg | ORAL_TABLET | Freq: Two times a day (BID) | ORAL | 0 refills | Status: AC | PRN

## 2022-05-31 NOTE — Progress Notes (Signed)
       CROSS COVER NOTE  NAME: [name] MRN: [id number] DOB : Apr 14, 1951    Date of Service   05/31/2022   HPI/Events of Note   Notified by RN of unwitnessed fall.  During bedside assessment patient is hemodynamically stable.  She is alert and oriented x4.  Patient reports getting up to use the bathroom and tripped on the iv pole in the dark.  Patient was assisted by staff back to bed. Patient denies dizziness, lightheadedness, weakness prior to fall.  Patient reports landing on her hands and knees.  Patient denies head injury.  Skin is intact, there is no present extremity deformity, swelling, redness, or lacerations. Active ROM intact. Grip is strong. All movement is purposeful. When palpating areas of impact, patient denies any tenderness or pain.  No need for imaging at this moment.  Bedside RN has been asked to monitor for any changes throughout the night.    Interventions/ Plan   Consider imaging hands and BLE if patient complains of pain, has difficulty with movements, or no changes come up.       [name], DNP, [place]

## 2022-05-31 NOTE — Discharge Summary (Signed)
Physician Discharge Summary  [name] VOH:[id number] DOB: June 10, 1951 DOA: 05/25/2022  PCP: [name], MD  Admit date: 05/25/2022 Discharge date: 05/31/2022  Admitted From: Home Disposition: Home  Recommendations for Outpatient Follow-up:  Follow up with PCP in 1-2 weeks Please obtain BMP/CBC in one week GI office will schedule follow-up  Home Health: N/A Equipment/Devices: N/A  Discharge Condition: Stable CODE STATUS: Full code Diet recommendation: Low-salt diet, oral hydration  Discharge summary: 71 year old with history of sigmoid diverticulosis and previous history of diverticulitis, history of in situ breast cancer, hypertension and insomnia presented to the ED with 3 days of gradual onset abdominal pain associated nausea , retching and constipation.  In the emergency room hemodynamically stable.  WBC count 18,000.  CT scan abdomen pelvis with uncomplicated sigmoid diverticulitis.  Admitted with significant findings, nausea vomiting and leukocytosis. Patient stayed in the hospital with inability to have bowel movement and persistent abdominal distention that has improved now.  # Acute uncomplicated diverticulitis, abdominal pain. Pain and nausea improved.  Needed very aggressive bowel regimen and now with multiple bowel movements and improvement of bowel function.  Tolerating regular diet.  Electrolytes are adequate.  Completed 7 days of antibiotics, will continue 3 more days of Augmentin.  Patient was prescribed a short course of tramadol for pain at home.  Seen by gastroenterology, they would schedule follow-up in the office.  Patient was advised to start taking laxatives once her iatrogenic diarrhea improves.   # Abnormal CT scan of the gallbladder: CT scan on admission showed a pouch in the gallbladder fundus.  This has been present, radiology was able to see it since 2012.  Repeat scan with similar appearance.  Patient was updated about the results.   Probably benign process.  Offered MRI but patient stated "I will just focus on the diverticulitis that brought me here".   Essential hypertension: Stable.  Resume home medications.   Hypokalemia/hypophosphatemia: Replaced with improvement.   Symptomatically improved.  Able to go home today with outpatient GI follow-up.  Discharge Diagnoses:  Principal Problem:   Acute diverticulitis Active Problems:   Essential hypertension   Malignant neoplasm of upper-outer quadrant of left breast in female, estrogen receptor positive ([place])   Diverticulitis large intestine   Abnormal CT scan, gallbladder    Discharge Instructions  Discharge Instructions     Call MD for:  persistant nausea and vomiting   Complete by: As directed    Call MD for:  severe uncontrolled pain   Complete by: As directed    Diet - low sodium heart healthy   Complete by: As directed    Soft and liquid diet   Discharge instructions   Complete by: As directed    Start taking laxatives as needed to help monitor small bowel movement today when your bowel movements are regulated.   Increase activity slowly   Complete by: As directed       Allergies as of 05/31/2022       Reactions   Tetracycline Nausea And Vomiting, Other (See Comments)   Abdominal pain   Candida Albicans Nausea And Vomiting, Other (See Comments)   Abdominal pain   Cephalexin Nausea And Vomiting, Other (See Comments)   Abdominal pain   Yeast Nausea And Vomiting, Other (See Comments)   Abdominal pain   Fenofibrate Other (See Comments)   Unknown reaction   Glycerine [glycerin] Nausea And Vomiting   Levofloxacin Other (See Comments)   "Destroyed her gut bacteria"   Lunesta [eszopiclone] Other (See  Comments)   Caused unwanted, excessive day-after grogginess   Tetracyclines & Related Nausea And Vomiting   Morphine Nausea And Vomiting        Medication List     STOP taking these medications    eszopiclone 2 MG Tabs tablet Commonly  known as: LUNESTA       TAKE these medications    Aleve 220 MG tablet Generic drug: naproxen sodium Take 220-440 mg by mouth 2 (two) times daily as needed (for pain).   amLODipine 2.5 MG tablet Commonly known as: NORVASC Take 2.5 mg by mouth daily after supper.   amoxicillin-clavulanate 875-125 MG tablet Commonly known as: AUGMENTIN Take 1 tablet by mouth every 12 (twelve) hours for 5 days.   Excedrin Tension Headache 500-65 MG Tabs per tablet Generic drug: acetaminophen-caffeine Take 1 tablet by mouth every 6 (six) hours as needed (for headaches).   Glucosamine-Chondroitin-MSM 500-400-125 MG Tabs Take 2 tablets by mouth daily.   irbesartan-hydrochlorothiazide 150-12.5 MG tablet Commonly known as: AVALIDE Take 1 tablet by mouth daily.   Multivitamin Women 50+ Tabs Take 1 tablet by mouth daily with breakfast.   omeprazole 20 MG capsule Commonly known as: PRILOSEC Take 20 mg by mouth daily before breakfast.   ondansetron 4 MG disintegrating tablet Commonly known as: Zofran ODT Take 1 tablet (4 mg total) by mouth every 8 (eight) hours as needed for nausea or vomiting.   Phazyme 180 MG Caps Generic drug: Simethicone Take 180-360 mg by mouth daily as needed (for gas).   polyethylene glycol 17 g packet Commonly known as: MIRALAX / GLYCOLAX Take 17 g by mouth daily as needed for moderate constipation or mild constipation.   PROBIOTIC DAILY PO Take 1 capsule by mouth daily.   traMADol 50 MG tablet Commonly known as: ULTRAM Take 1 tablet (50 mg total) by mouth every 12 (twelve) hours as needed for up to 5 days for moderate pain. What changed:  when to take this reasons to take this   Voltaren 1 % Gel Generic drug: diclofenac sodium Apply 2 g topically 4 (four) times daily.   zaleplon 10 MG capsule Commonly known as: SONATA Take 10 mg by mouth at bedtime as needed for sleep.        Allergies  Allergen Reactions   Tetracycline Nausea And Vomiting and  Other (See Comments)    Abdominal pain   Candida Albicans Nausea And Vomiting and Other (See Comments)    Abdominal pain   Cephalexin Nausea And Vomiting and Other (See Comments)    Abdominal pain   Yeast Nausea And Vomiting and Other (See Comments)    Abdominal pain   Fenofibrate Other (See Comments)    Unknown reaction   Glycerine [Glycerin] Nausea And Vomiting   Levofloxacin Other (See Comments)    "Destroyed her gut bacteria"   Lunesta [Eszopiclone] Other (See Comments)    Caused unwanted, excessive day-after grogginess   Tetracyclines & Related Nausea And Vomiting   Morphine Nausea And Vomiting    Consultations: Gastroenterology   Procedures/Studies: CT ABDOMEN PELVIS W CONTRAST  Result Date: 05/28/2022 CLINICAL DATA:  Persistent abdominal pain with distention and ileus. Diverticulitis. EXAM: CT ABDOMEN AND PELVIS WITH CONTRAST TECHNIQUE: Multidetector CT imaging of the abdomen and pelvis was performed using the standard protocol following bolus administration of intravenous contrast. RADIATION DOSE REDUCTION: This exam was performed according to the departmental dose-optimization program which includes automated exposure control, adjustment of the mA and/or kV according to patient size and/or use of  iterative reconstruction technique. CONTRAST:  158m OMNIPAQUE IOHEXOL 300 MG/ML  SOLN COMPARISON:  06/25/2022 FINDINGS: Lower chest: New small bilateral pleural effusions with associated mild lower lobe atelectasis bilaterally. Mild cardiomegaly. Small to moderate-sized type 3 hiatal hernia. Mild scarring or atelectasis in the right middle lobe. Hepatobiliary: There is a suggestion of accentuated density anteriorly along the gallbladder fundus, possibly from hyperplastic cholecystosis, adherent gallstone, or Phrygian cap. Similar appearance back through 12/18/2010 indicates that this is unlikely to be a malignancy. Accentuated density in the gallbladder probably from vicarious contrast  excretion. No focal hepatic parenchymal lesion is observed. Pancreas: Unremarkable Spleen: Unremarkable Adrenals/Urinary Tract: Both adrenal glands appear normal. 3 mm right kidney lower pole nonobstructive renal calculus on image 51 series 4. 4 mm left mid to lower kidney nonobstructive renal calculus, image 54 series 4. 6 mm left mid kidney hypodense lesion on image 37 of series 2 is unchanged from 12/18/2010 and compatible with a benign cyst. No further workup is warranted. No hydronephrosis or hydroureter. The kidneys appear otherwise unremarkable. Mild wall thickening in the dome of the urinary bladder as on image 64 series 5, likely secondary to the adjacent inflammatory findings in the sigmoid colon. Stomach/Bowel: Fluid-filled proximal large bowel with air-fluid levels. There is descending and sigmoid colon diverticulosis with some wall thickening in the proximal sigmoid colon on image 41 series 4, and suspected inflammation in the distal sigmoid colon potentially from colitis or active diverticulitis. No extraluminal gas or abscess is observed. Infiltrative fluid signal is most notable along the distal sigmoid colon and paracolic adipose tissue for example on image 57 series 2. No appendiceal inflammation observed. No dilated small bowel. Vascular/Lymphatic: Mild atherosclerotic calcification in the left renal artery and abdominal aorta. The celiac trunk, SMA, and inferior mesenteric artery appear patent. Scattered retroperitoneal lymph nodes are probably reactive and include a 0.7 cm right periaortic lymph node on image 31 series 2. Reproductive: Unremarkable Other: Scattered mild ascites, new compared to previous. Musculoskeletal: Lower lumbar degenerative facet arthropathy. IMPRESSION: 1. Wall thickening in the proximal sigmoid colon and in the distal sigmoid colon, with adjacent infiltrative fluid signal in the paracolic adipose tissue. Appearance favors colitis or active diverticulitis. No extraluminal  gas or abscess. 2. New small bilateral pleural effusions with mild lower lobe atelectasis. 3. New mild ascites. 4. Mild wall thickening in the dome of the urinary bladder, likely secondary to the adjacent inflammatory findings in the sigmoid colon. 5. Small to moderate-sized type 3 hiatal hernia. 6. Mild cardiomegaly. 7. Bilateral nonobstructive nephrolithiasis. 8. Accentuated density anteriorly along the gallbladder fundus, possibly from hyperplastic cholecystosis, adherent gallstone, or Phrygian cap. Similar appearance back through 12/18/2010 indicates that this is unlikely to be a malignancy. 9. Mild atherosclerotic calcification in the abdominal aorta and left renal artery. Aortic Atherosclerosis (ICD10-I70.0). Electronically Signed   By: WVan ClinesM.D.   On: 05/28/2022 13:02   DG Abd 1 View  Result Date: 05/27/2022 CLINICAL DATA:  Abdominal distention. EXAM: ABDOMEN - 1 VIEW COMPARISON:  02/10/2021 FINDINGS: Mild gaseous distention of small bowel up to 2.3 cm diameter without frankly obstructive pattern. Prominent stool volume noted throughout the colon. Mild gaseous distention of the stomach. IMPRESSION: Prominent stool volume throughout the colon. Mild gaseous distention of central small bowel loops. Component of underlying ileus would be a consideration. Electronically Signed   By: EMisty StanleyM.D.   On: 05/27/2022 09:16   CT ABDOMEN PELVIS W CONTRAST  Result Date: 05/25/2022 CLINICAL DATA:  71year old female with history  of left lower quadrant abdominal pain for 1 week. EXAM: CT ABDOMEN AND PELVIS WITH CONTRAST TECHNIQUE: Multidetector CT imaging of the abdomen and pelvis was performed using the standard protocol following bolus administration of intravenous contrast. RADIATION DOSE REDUCTION: This exam was performed according to the departmental dose-optimization program which includes automated exposure control, adjustment of the mA and/or kV according to patient size and/or use of  iterative reconstruction technique. CONTRAST:  3m OMNIPAQUE IOHEXOL 300 MG/ML  SOLN COMPARISON:  CT of the abdomen and pelvis 02/10/2021. FINDINGS: Lower chest: Mild scarring in the right middle lobe. Moderate-sized hiatal hernia. Hepatobiliary: No suspicious cystic or solid hepatic lesions. No intra or extrahepatic biliary ductal dilatation. Along the anterior aspect of the gallbladder in the region of the fundus (axial image 26 of series 2 and sagittal image 37 of series 6) there is a high density structure measuring 1.0 x 1.2 x 0.6 cm, concerning for enhancing soft tissue although this could represent an adherent partially calcified gallstone. No gallbladder wall thickening otherwise noted. No pericholecystic fluid or surrounding inflammatory changes. Pancreas: No pancreatic mass. No pancreatic ductal dilatation. No pancreatic or peripancreatic fluid collections or inflammatory changes. Spleen: Unremarkable. Adrenals/Urinary Tract: 2 mm nonobstructive calculus in the lower pole collecting system of the left kidney. Bilateral kidneys and bilateral adrenal glands are otherwise normal in appearance. No hydroureteronephrosis. Urinary bladder is unremarkable in appearance. Stomach/Bowel: Intra-abdominal portion of the stomach is unremarkable. No pathologic dilatation of small bowel or colon. Numerous colonic diverticuli are noted. In the region of the sigmoid colon there are inflammatory changes in the mesocolon, concerning for potential acute diverticulitis. No discrete diverticular abscess or signs of frank perforation or confidently identified at this time. Normal appendix. Vascular/Lymphatic: Atherosclerotic calcifications in the abdominal aorta and pelvic vasculature, without evidence of aneurysm or dissection. No lymphadenopathy noted in the abdomen or pelvis. Reproductive: Uterus and ovaries are unremarkable in appearance. Other: Trace volume of free fluid, most evident in the low anatomic pelvis. No  pneumoperitoneum. Musculoskeletal: There are no aggressive appearing lytic or blastic lesions noted in the visualized portions of the skeleton. IMPRESSION: 1. Findings are concerning for acute sigmoid diverticulitis. No diverticular abscess or signs of frank perforation are noted at this time. 2. Indeterminate lesion associated with the fundus of the gallbladder suspicious for small soft tissue neoplasm, although this could alternatively represent an adherent gallstone. Further evaluation with follow-up nonemergent outpatient abdominal MRI with and without IV gadolinium with MRCP should be considered after resolution of the patient's acute illness. 3. 2 mm nonobstructive calculus in the lower pole collecting system of the left kidney. 4. Aortic atherosclerosis. 5. Moderate-sized hiatal hernia. Electronically Signed   By: DVinnie LangtonM.D.   On: 05/25/2022 06:57   (Echo, Carotid, EGD, Colonoscopy, ERCP)    Subjective: Patient seen and examined.  After multiple bowel regimens she had multiple bowel movements overnight.  Abdominal pain and distention has improved.  She is satisfied today with her appetite and oral intake.  Eager to go home.   Discharge Exam: Vitals:   05/31/22 0248 05/31/22 0313  BP: (!) 142/91 (!) 142/91  Pulse: (!) 107 (!) 107  Resp: 20 20  Temp: 98.2 F (36.8 C) 98.2 F (36.8 C)  SpO2: 99% 99%   Vitals:   05/30/22 1400 05/30/22 2033 05/31/22 0248 05/31/22 0313  BP: (!) 141/78 137/80 (!) 142/91 (!) 142/91  Pulse: 84 84 (!) 107 (!) 107  Resp: '17 20 20 20  '$ Temp: 98.1 F (36.7 C)  99.4 F (37.4 C) 98.2 F (36.8 C) 98.2 F (36.8 C)  TempSrc: Oral  Oral Oral  SpO2: 94% 98% 99% 99%  Weight:      Height:        General: Pt is alert, awake, not in acute distress Cardiovascular: RRR, S1/S2 +, no rubs, no gallops Respiratory: CTA bilaterally, no wheezing, no rhonchi Abdominal: Soft, NT, ND, bowel sounds + Extremities: no edema, no cyanosis    The results of  significant diagnostics from this hospitalization (including imaging, microbiology, ancillary and laboratory) are listed below for reference.     Microbiology: No results found for this or any previous visit (from the past 240 hour(s)).   Labs: BNP (last 3 results) No results for input(s): "BNP" in the last 8760 hours. Basic Metabolic Panel: Recent Labs  Lab 05/25/22 0229 05/26/22 0515 05/27/22 0536 05/29/22 0545  NA 132* 136 136 137  K 3.5 3.2* 4.2 3.9  CL 96* 106 108 107  CO2 21* '25 24 23  '$ GLUCOSE 155* 104* 112* 115*  BUN '16 11 11 '$ 6*  CREATININE 0.82 0.67 0.59 0.71  CALCIUM 9.6 7.5* 7.5* 7.8*  MG  --  2.0 2.1 1.9  PHOS  --   --  2.1* 2.8   Liver Function Tests: Recent Labs  Lab 05/25/22 0229 05/26/22 0515 05/29/22 0545  AST '27 20 23  '$ ALT 34 22 19  ALKPHOS 85 59 220*  BILITOT 1.0 0.9 0.9  PROT 7.6 5.8* 5.5*  ALBUMIN 4.3 3.0* 2.6*   Recent Labs  Lab 05/25/22 0229  LIPASE 14   No results for input(s): "AMMONIA" in the last 168 hours. CBC: Recent Labs  Lab 05/25/22 0229 05/26/22 0515 05/27/22 0536 05/29/22 0545  WBC 18.0* 12.7* 9.8 10.1  NEUTROABS  --   --  7.3 7.2  HGB 16.4* 12.5 11.5* 11.7*  HCT 45.7 36.3 34.1* 35.0*  MCV 87.7 92.4 93.9 93.6  PLT 374 261 249 271   Cardiac Enzymes: No results for input(s): "CKTOTAL", "CKMB", "CKMBINDEX", "TROPONINI" in the last 168 hours. BNP: Invalid input(s): "POCBNP" CBG: Recent Labs  Lab 05/27/22 0736 05/28/22 0728 05/29/22 0846 05/30/22 0805 05/31/22 0828  GLUCAP 94 95 113* 122* 90   D-Dimer No results for input(s): "DDIMER" in the last 72 hours. Hgb A1c No results for input(s): "HGBA1C" in the last 72 hours. Lipid Profile No results for input(s): "CHOL", "HDL", "LDLCALC", "TRIG", "CHOLHDL", "LDLDIRECT" in the last 72 hours. Thyroid function studies No results for input(s): "TSH", "T4TOTAL", "T3FREE", "THYROIDAB" in the last 72 hours.  Invalid input(s): "FREET3" Anemia work up No results for  input(s): "VITAMINB12", "FOLATE", "FERRITIN", "TIBC", "IRON", "RETICCTPCT" in the last 72 hours. Urinalysis    Component Value Date/Time   COLORURINE YELLOW 05/25/2022 0530   APPEARANCEUR CLEAR 05/25/2022 0530   LABSPEC 1.037 (H) 05/25/2022 0530   PHURINE 5.5 05/25/2022 0530   GLUCOSEU NEGATIVE 05/25/2022 0530   HGBUR TRACE (A) 05/25/2022 0530   BILIRUBINUR NEGATIVE 05/25/2022 0530   KETONESUR >80 (A) 05/25/2022 0530   PROTEINUR 30 (A) 05/25/2022 0530   NITRITE NEGATIVE 05/25/2022 0530   LEUKOCYTESUR NEGATIVE 05/25/2022 0530   Sepsis Labs Recent Labs  Lab 05/25/22 0229 05/26/22 0515 05/27/22 0536 05/29/22 0545  WBC 18.0* 12.7* 9.8 10.1   Microbiology No results found for this or any previous visit (from the past 240 hour(s)).   Time coordinating discharge: 35 minutes  SIGNED:   Barb Merino, MD  [facility] 05/31/2022, 10:17 AM

## 2022-05-31 NOTE — Progress Notes (Signed)
   05/31/22 0313  What Happened  Was fall witnessed? No  Was patient injured? Unsure  Patient found on floor  Found by Staff-comment Bethann Berkshire RN)  Stated prior activity ambulating-unassisted  Follow Up  MD notified Zebedee Iba NP  Time MD notified [phone]  Family notified No - patient refusal  Additional tests No  Simple treatment Other (comment) (pt refused)  Progress note created (see row info) Yes  Adult Fall Risk Assessment  Risk Factor Category (scoring not indicated) Fall has occurred during this admission (document High fall risk);High fall risk per protocol (document High fall risk)  Patient Fall Risk Level High fall risk  Adult Fall Risk Interventions  Required Bundle Interventions *See Row Information* High fall risk - low, moderate, and high requirements implemented  Additional Interventions Use of appropriate toileting equipment (bedpan, BSC, etc.)  Screening for Fall Injury Risk (To be completed on HIGH fall risk patients) - Assessing Need for Floor Mats  Risk For Fall Injury- Criteria for Floor Mats Previous fall this admission  Will Implement Floor Mats Yes  Vitals  Temp 98.2 F (36.8 C)  Temp Source Oral  BP (!) 142/91  MAP (mmHg) 107  BP Location Right Arm  BP Method Automatic  Patient Position (if appropriate) Lying  Pulse Rate (!) 107  Pulse Rate Source Dinamap  Resp 20  Oxygen Therapy  SpO2 99 %  Pain Assessment  Pain Scale 0-10  Pain Score 0  PCA/Epidural/Spinal Assessment  Respiratory Pattern Regular;Unlabored  Neurological  Neuro (WDL) WDL  Glasgow Coma Scale  Eye Opening 4  Best Verbal Response (NON-intubated) 5  Best Motor Response 6  Glasgow Coma Scale Score 15  Musculoskeletal  Musculoskeletal (WDL) X  Assistive Device BSC  Generalized Weakness Yes  Integumentary  Integumentary (WDL) WDL

## 2022-06-08 DIAGNOSIS — K5792 Diverticulitis of intestine, part unspecified, without perforation or abscess without bleeding: Secondary | ICD-10-CM | POA: Diagnosis not present

## 2022-06-08 DIAGNOSIS — K219 Gastro-esophageal reflux disease without esophagitis: Secondary | ICD-10-CM | POA: Diagnosis not present

## 2022-06-08 DIAGNOSIS — I10 Essential (primary) hypertension: Secondary | ICD-10-CM | POA: Diagnosis not present

## 2022-06-28 ENCOUNTER — Ambulatory Visit
Admission: RE | Admit: 2022-06-28 | Discharge: 2022-06-28 | Disposition: A | Discharge status: Home / Self Care | Payer: Medicare Other | Source: Ambulatory Visit | Attending: Oncology | Admitting: Oncology

## 2022-06-28 ENCOUNTER — Other Ambulatory Visit: Payer: Self-pay | Admitting: Adult Health

## 2022-06-28 ENCOUNTER — Other Ambulatory Visit: Payer: Self-pay | Admitting: Oncology

## 2022-06-28 ENCOUNTER — Ambulatory Visit
Admission: RE | Admit: 2022-06-28 | Discharge: 2022-06-28 | Disposition: A | Discharge status: Home / Self Care | Payer: Medicare Other | Source: Ambulatory Visit | Attending: Adult Health | Admitting: Adult Health

## 2022-06-28 DIAGNOSIS — Z170 Estrogen receptor positive status [ER+]: Secondary | ICD-10-CM

## 2022-06-28 DIAGNOSIS — D0512 Intraductal carcinoma in situ of left breast: Secondary | ICD-10-CM

## 2022-06-28 DIAGNOSIS — C50412 Malignant neoplasm of upper-outer quadrant of left female breast: Secondary | ICD-10-CM

## 2022-06-28 DIAGNOSIS — N644 Mastodynia: Secondary | ICD-10-CM | POA: Diagnosis not present

## 2022-07-06 NOTE — Progress Notes (Signed)
 Patient Care Team: McNeill, Wendy, MD as PCP - General (Family Medicine) Magrinat, Gustav C, MD (Inactive) as Consulting Physician (Oncology) Blackman, Douglas, MD as Consulting Physician (General Surgery) Moody, John, MD as Consulting Physician (Radiation Oncology) Kaplan, Richard, MD as Consulting Physician (Obstetrics and Gynecology) Magod, Marc, MD as Consulting Physician (Gastroenterology) Truesdale, Gerald, MD (Inactive) as Consulting Physician (Plastic Surgery)  DIAGNOSIS:  Encounter Diagnosis  Name Primary?   Malignant neoplasm of upper-outer quadrant of left breast in female, estrogen receptor positive (HCC) Yes    CHIEF COMPLIANT: ER positive DCIS diffuse papillomatosis  INTERVAL HISTORY: [name] is a 71 y.o. with the above-mentioned ER positive DCIS and diffuse papillomatosis. She presents to the clinic for a follow-up and Establish oncology care with Dr. . She states that she is doing better since she got out the hospital. She says she still have some pain under arm and in breast.    ALLERGIES:  is allergic to tetracycline, candida albicans, cephalexin, yeast, fenofibrate, glycerine [glycerin], levofloxacin, lunesta [eszopiclone], tetracyclines & related, and morphine.  MEDICATIONS:  Current Outpatient Medications  Medication Sig Dispense Refill   BISACODYL 5 MG EC tablet Take by mouth.     ALEVE 220 MG tablet Take 220-440 mg by mouth 2 (two) times daily as needed (for pain).     amLODipine (NORVASC) 2.5 MG tablet Take 2.5 mg by mouth daily after supper.     diclofenac sodium (VOLTAREN) 1 % GEL Apply 2 g topically 4 (four) times daily.     EXCEDRIN TENSION HEADACHE 500-65 MG TABS per tablet Take 1 tablet by mouth every 6 (six) hours as needed (for headaches).     Glucosamine-Chondroitin-MSM 500-400-125 MG TABS Take 2 tablets by mouth daily.     irbesartan-hydrochlorothiazide (AVALIDE) 150-12.5 MG tablet Take 1 tablet by mouth daily.     Multiple  Vitamins-Minerals (MULTIVITAMIN WOMEN 50+) TABS Take 1 tablet by mouth daily with breakfast.     omeprazole (PRILOSEC) 20 MG capsule Take 20 mg by mouth daily before breakfast.     ondansetron (ZOFRAN ODT) 4 MG disintegrating tablet Take 1 tablet (4 mg total) by mouth every 8 (eight) hours as needed for nausea or vomiting. 12 tablet 1   polyethylene glycol (MIRALAX / GLYCOLAX) 17 g packet Take 17 g by mouth daily as needed for moderate constipation or mild constipation. 14 each 0   polyethylene glycol-electrolytes (NULYTELY) 420 g solution Take by mouth as directed.     Probiotic Product (PROBIOTIC DAILY PO) Take 1 capsule by mouth daily.     Simethicone (PHAZYME) 180 MG CAPS Take 180-360 mg by mouth daily as needed (for gas).     zaleplon (SONATA) 10 MG capsule Take 10 mg by mouth at bedtime as needed for sleep.     Zoster Vaccine Adjuvanted (SHINGRIX) injection Inject 0.5 mLs into the muscle once.     No current facility-administered medications for this visit.    PHYSICAL EXAMINATION: ECOG PERFORMANCE STATUS: 1 - Symptomatic but completely ambulatory  Vitals:   07/10/22 1542  BP: (!) 145/76  Pulse: 83  Resp: 18  Temp: 97.8 F (36.6 C)  SpO2: 97%   Filed Weights   07/10/22 1542  Weight: 131 lb 9.6 oz (59.7 kg)      LABORATORY DATA:  I have reviewed the data as listed    Latest Ref Rng & Units 05/29/2022    5:45 AM 05/27/2022    5:36 AM 05/26/2022    5:15 AM  CMP    Glucose 70 - 99 mg/dL 115  112  104   BUN 8 - 23 mg/dL 6  11  11   Creatinine 0.44 - 1.00 mg/dL 0.71  0.59  0.67   Sodium 135 - 145 mmol/L 137  136  136   Potassium 3.5 - 5.1 mmol/L 3.9  4.2  3.2   Chloride 98 - 111 mmol/L 107  108  106   CO2 22 - 32 mmol/L 23  24  25   Calcium 8.9 - 10.3 mg/dL 7.8  7.5  7.5   Total Protein 6.5 - 8.1 g/dL 5.5   5.8   Total Bilirubin 0.3 - 1.2 mg/dL 0.9   0.9   Alkaline Phos 38 - 126 U/L 220   59   AST 15 - 41 U/L 23   20   ALT 0 - 44 U/L 19   22     Lab Results   Component Value Date   WBC 10.1 05/29/2022   HGB 11.7 (L) 05/29/2022   HCT 35.0 (L) 05/29/2022   MCV 93.6 05/29/2022   PLT 271 05/29/2022   NEUTROABS 7.2 05/29/2022    ASSESSMENT & PLAN:  Malignant neoplasm of upper-outer quadrant of left breast in female, estrogen receptor positive (HCC) 08/27/2017: Left breast biopsy UOQ DCIS grade 2 ER/PR positive 09/25/2017: Left lumpectomy: Grade 2 DCIS 2.1 cm, margins Decided against adjuvant radiation and against antiestrogens 09/05/2018: Intraductal papilloma 05/19/2019: Multiple dilated ducts containing enhancing intraductal masses (decided against surgery) 07/25/2020: Multiple biopsies showing papillomas  Hospitalization 05/25/2018-05/31/2018: Acute diverticulitis  Breast cancer surveillance: Breast exam 07/10/2022: Benign Mammogram 06/28/2022: Benign, targeted ultrasound: Mass 0.4 cm stable Breast MRI will be obtained for enhanced surveillance  She does tai chi and yoga (Yogananda follower: went to [place]) Return to clinic in 1 year for follow-up  Orders Placed This Encounter  Procedures   MR BREAST BILATERAL W WO CONTRAST INC CAD    Standing Status:   Future    Standing Expiration Date:   07/11/2023    Order Specific Question:   If indicated for the ordered procedure, I authorize the administration of contrast media per Radiology protocol    Answer:   Yes    Order Specific Question:   What is the patient's sedation requirement?    Answer:   No Sedation    Order Specific Question:   Does the patient have a pacemaker or implanted devices?    Answer:   No    Order Specific Question:   Preferred imaging location?    Answer:   [facility] (table limit-550lbs)   The patient has a good understanding of the overall plan. she agrees with it. she will call with any problems that may develop before the next visit here. Total time spent: 30 mins including face to face time and time spent for planning, charting and co-ordination of  care   Viinay K , MD 07/10/22    I Deritra, Mcnairy am scribing for Dr.   I have reviewed the above documentation for accuracy and completeness, and I agree with the above.   

## 2022-07-07 ENCOUNTER — Encounter: Payer: Self-pay | Admitting: Hematology and Oncology

## 2022-07-10 ENCOUNTER — Inpatient Hospital Stay: Payer: Medicare Other

## 2022-07-10 ENCOUNTER — Other Ambulatory Visit: Payer: Self-pay

## 2022-07-10 ENCOUNTER — Inpatient Hospital Stay: Payer: Medicare Other | Attending: Hematology and Oncology | Admitting: Hematology and Oncology

## 2022-07-10 VITALS — BP 145/76 | HR 83 | Temp 97.8°F | Resp 18 | Ht 60.0 in | Wt 131.6 lb

## 2022-07-10 DIAGNOSIS — Z79899 Other long term (current) drug therapy: Secondary | ICD-10-CM | POA: Diagnosis not present

## 2022-07-10 DIAGNOSIS — C50412 Malignant neoplasm of upper-outer quadrant of left female breast: Secondary | ICD-10-CM | POA: Diagnosis not present

## 2022-07-10 DIAGNOSIS — Z170 Estrogen receptor positive status [ER+]: Secondary | ICD-10-CM | POA: Diagnosis not present

## 2022-07-10 NOTE — Assessment & Plan Note (Addendum)
08/27/2017: Left breast biopsy UOQ DCIS grade 2 ER/PR positive 09/25/2017: Left lumpectomy: Grade 2 DCIS 2.1 cm, margins Decided against adjuvant radiation and against antiestrogens 09/05/2018: Intraductal papilloma 05/19/2019: Multiple dilated ducts containing enhancing intraductal masses (decided against surgery) 07/25/2020: Multiple biopsies showing papillomas  Hospitalization 05/25/2018-05/31/2018: Acute diverticulitis  Breast cancer surveillance: Breast exam 07/10/2022: Benign Mammogram 06/28/2022: Benign, targeted ultrasound: Mass 0.4 cm stable Breast MRI will be obtained for enhanced surveillance

## 2022-07-19 DIAGNOSIS — R1310 Dysphagia, unspecified: Secondary | ICD-10-CM | POA: Diagnosis not present

## 2022-07-19 DIAGNOSIS — K5730 Diverticulosis of large intestine without perforation or abscess without bleeding: Secondary | ICD-10-CM | POA: Diagnosis not present

## 2022-07-19 DIAGNOSIS — D130 Benign neoplasm of esophagus: Secondary | ICD-10-CM | POA: Diagnosis not present

## 2022-07-19 DIAGNOSIS — Z1211 Encounter for screening for malignant neoplasm of colon: Secondary | ICD-10-CM | POA: Diagnosis not present

## 2022-07-19 DIAGNOSIS — K5732 Diverticulitis of large intestine without perforation or abscess without bleeding: Secondary | ICD-10-CM | POA: Diagnosis not present

## 2022-07-19 DIAGNOSIS — Z800 Family history of malignant neoplasm of digestive organs: Secondary | ICD-10-CM | POA: Diagnosis not present

## 2022-07-19 DIAGNOSIS — K222 Esophageal obstruction: Secondary | ICD-10-CM | POA: Diagnosis not present

## 2022-07-19 DIAGNOSIS — K449 Diaphragmatic hernia without obstruction or gangrene: Secondary | ICD-10-CM | POA: Diagnosis not present

## 2022-07-19 DIAGNOSIS — K5140 Inflammatory polyps of colon without complications: Secondary | ICD-10-CM | POA: Diagnosis not present

## 2022-07-19 DIAGNOSIS — K649 Unspecified hemorrhoids: Secondary | ICD-10-CM | POA: Diagnosis not present

## 2022-07-19 DIAGNOSIS — K219 Gastro-esophageal reflux disease without esophagitis: Secondary | ICD-10-CM | POA: Diagnosis not present

## 2022-07-20 ENCOUNTER — Other Ambulatory Visit: Payer: Self-pay | Admitting: Gastroenterology

## 2022-07-20 DIAGNOSIS — R935 Abnormal findings on diagnostic imaging of other abdominal regions, including retroperitoneum: Secondary | ICD-10-CM

## 2022-07-24 DIAGNOSIS — D130 Benign neoplasm of esophagus: Secondary | ICD-10-CM | POA: Diagnosis not present

## 2022-07-24 DIAGNOSIS — K5140 Inflammatory polyps of colon without complications: Secondary | ICD-10-CM | POA: Diagnosis not present

## 2022-08-24 ENCOUNTER — Other Ambulatory Visit: Payer: Self-pay | Admitting: Gastroenterology

## 2022-08-24 DIAGNOSIS — R935 Abnormal findings on diagnostic imaging of other abdominal regions, including retroperitoneum: Secondary | ICD-10-CM

## 2022-08-29 DIAGNOSIS — Z8719 Personal history of other diseases of the digestive system: Secondary | ICD-10-CM | POA: Diagnosis not present

## 2022-08-29 DIAGNOSIS — K5790 Diverticulosis of intestine, part unspecified, without perforation or abscess without bleeding: Secondary | ICD-10-CM | POA: Diagnosis not present

## 2022-09-17 ENCOUNTER — Ambulatory Visit
Admission: RE | Admit: 2022-09-17 | Discharge: 2022-09-17 | Disposition: A | Discharge status: Home / Self Care | Payer: Medicare Other | Source: Ambulatory Visit | Attending: Gastroenterology | Admitting: Gastroenterology

## 2022-09-17 DIAGNOSIS — R935 Abnormal findings on diagnostic imaging of other abdominal regions, including retroperitoneum: Secondary | ICD-10-CM

## 2022-09-17 DIAGNOSIS — K5730 Diverticulosis of large intestine without perforation or abscess without bleeding: Secondary | ICD-10-CM | POA: Diagnosis not present

## 2022-09-17 DIAGNOSIS — K449 Diaphragmatic hernia without obstruction or gangrene: Secondary | ICD-10-CM | POA: Diagnosis not present

## 2022-09-17 MED ORDER — GADOPICLENOL 0.5 MMOL/ML IV SOLN
6.0000 mL | Freq: Once | INTRAVENOUS | Status: AC | PRN
  Administered 2022-09-17: 6 mL via INTRAVENOUS

## 2022-11-01 DIAGNOSIS — M9904 Segmental and somatic dysfunction of sacral region: Secondary | ICD-10-CM | POA: Diagnosis not present

## 2022-11-01 DIAGNOSIS — M5136 Other intervertebral disc degeneration, lumbar region: Secondary | ICD-10-CM | POA: Diagnosis not present

## 2022-11-01 DIAGNOSIS — M9903 Segmental and somatic dysfunction of lumbar region: Secondary | ICD-10-CM | POA: Diagnosis not present

## 2022-11-01 DIAGNOSIS — M9905 Segmental and somatic dysfunction of pelvic region: Secondary | ICD-10-CM | POA: Diagnosis not present

## 2022-11-05 ENCOUNTER — Encounter: Payer: Self-pay | Admitting: Hematology and Oncology

## 2022-11-06 DIAGNOSIS — M9904 Segmental and somatic dysfunction of sacral region: Secondary | ICD-10-CM | POA: Diagnosis not present

## 2022-11-06 DIAGNOSIS — M9905 Segmental and somatic dysfunction of pelvic region: Secondary | ICD-10-CM | POA: Diagnosis not present

## 2022-11-06 DIAGNOSIS — M9903 Segmental and somatic dysfunction of lumbar region: Secondary | ICD-10-CM | POA: Diagnosis not present

## 2022-11-06 DIAGNOSIS — M5136 Other intervertebral disc degeneration, lumbar region: Secondary | ICD-10-CM | POA: Diagnosis not present

## 2022-11-08 ENCOUNTER — Telehealth: Payer: Medicare Other | Admitting: Adult Health

## 2022-11-14 DIAGNOSIS — R7309 Other abnormal glucose: Secondary | ICD-10-CM | POA: Diagnosis not present

## 2022-11-14 DIAGNOSIS — K5140 Inflammatory polyps of colon without complications: Secondary | ICD-10-CM | POA: Diagnosis not present

## 2022-11-14 DIAGNOSIS — K219 Gastro-esophageal reflux disease without esophagitis: Secondary | ICD-10-CM | POA: Diagnosis not present

## 2022-11-14 DIAGNOSIS — M25561 Pain in right knee: Secondary | ICD-10-CM | POA: Diagnosis not present

## 2022-11-14 DIAGNOSIS — K5732 Diverticulitis of large intestine without perforation or abscess without bleeding: Secondary | ICD-10-CM | POA: Diagnosis not present

## 2022-11-14 DIAGNOSIS — Z170 Estrogen receptor positive status [ER+]: Secondary | ICD-10-CM | POA: Diagnosis not present

## 2022-11-14 DIAGNOSIS — E781 Pure hyperglyceridemia: Secondary | ICD-10-CM | POA: Diagnosis not present

## 2022-11-14 DIAGNOSIS — G479 Sleep disorder, unspecified: Secondary | ICD-10-CM | POA: Diagnosis not present

## 2022-11-14 DIAGNOSIS — I10 Essential (primary) hypertension: Secondary | ICD-10-CM | POA: Diagnosis not present

## 2022-11-20 ENCOUNTER — Other Ambulatory Visit: Payer: Self-pay | Admitting: Family Medicine

## 2022-11-20 ENCOUNTER — Encounter: Payer: Self-pay | Admitting: Adult Health

## 2022-11-20 ENCOUNTER — Telehealth: Payer: Self-pay

## 2022-11-20 ENCOUNTER — Inpatient Hospital Stay: Payer: Medicare Other | Attending: Adult Health | Admitting: Adult Health

## 2022-11-20 DIAGNOSIS — Z170 Estrogen receptor positive status [ER+]: Secondary | ICD-10-CM

## 2022-11-20 DIAGNOSIS — M85852 Other specified disorders of bone density and structure, left thigh: Secondary | ICD-10-CM

## 2022-11-20 DIAGNOSIS — C50412 Malignant neoplasm of upper-outer quadrant of left female breast: Secondary | ICD-10-CM

## 2022-11-20 NOTE — Telephone Encounter (Signed)
Left VM for patient regarding new appointments at the breast center for November 18th 2024 at 8am. Gave callback # [phone] if she has any questions or concerns.

## 2022-11-20 NOTE — Assessment & Plan Note (Signed)
[name] is a 73 year old woman with history of left breast DCIS diagnosed in January 2019 status post lumpectomy opted to forego adjuvant radiation and antiestrogen therapy.  This was followed by an intraductal papilloma and right breast ADH.  Breast cancer high risk:  Annual 3d Diagnostic mammogram with left breast ultrasound--ordered for 07/01/2023 Opted to forego intensified screening with breast MRI Understands need for breast self awareness  Annual f/u after breast mammogram  Discussed the above recommendations and through shared decision making came to the above conclusions.  We reviewed the risks and benefits of intensified screening and she understands that her risk may be slightly higher however it is understandable and reasonable for her to go with diagnostic 3D mammogram alone with left breast ultrasound to monitor the likely benign mass in the left breast with her breast density category A and desired to avoid false positives that can happen on breast MRI.  She will return in November 2024 to review her mammogram results with Dr. Pamelia Hoit and will let us know in the interim if she develops any new breast concerns as she knows we will need to evaluate this promptly.

## 2022-11-20 NOTE — Progress Notes (Signed)
[facility] Cancer Follow up:    Gweneth Dimitri, MD [address] [place] [address]   DIAGNOSIS:  Cancer Staging  Malignant neoplasm of upper-outer quadrant of left breast in female, estrogen receptor positive Staging form: Breast, AJCC 8th Edition - Pathologic: Stage 0 (pTis (DCIS), pN0, cM0, ER+, PR+) - Unsigned  I connected with Britt Albach "[name]" on 11/20/22 at  8:30 AM EDT by telephone and verified that I am speaking with the correct person using two identifiers.  I discussed the limitations, risks, security and privacy concerns of performing an evaluation and management service by telephone and the availability of in person appointments.  I also discussed with the patient that there may be a patient responsible charge related to this service. The patient expressed understanding and agreed to proceed.  Patient location: home Provider location: [facility] office  SUMMARY OF ONCOLOGIC HISTORY: 08/27/2017: Left breast biopsy UOQ DCIS grade 2 ER/PR positive 09/25/2017: Left lumpectomy: Grade 2 DCIS 2.1 cm, margins Decided against adjuvant radiation and against antiestrogens 09/05/2018: Intraductal papilloma 05/19/2019: Multiple dilated ducts containing enhancing intraductal masses (decided against surgery) 07/25/2020: Multiple biopsies showing papillomas  CURRENT THERAPY:observation  INTERVAL HISTORY: Jonnell Kucinski 72 y.o. female returns for discussion to inquire around the need for continuing breast MRI.  [name] notes that she really does not want to undergo annual breast MRI.  She is concerned about the false positive rate and that leading to more investigation into things that are benign.  She wants to know if continuing with annual 3D mammogram and ultrasound is sufficient.  Her most recent 3D mammogram and left breast ultrasound occurred on June 28, 2022 demonstrating a likely benign mass that was unchanged in size in the left breast, no  mammographic evidence of malignancy, and breast density category A.     Patient Active Problem List   Diagnosis Date Noted   Diverticulitis large intestine 05/25/2022   Abnormal CT scan, gallbladder 05/25/2022   Diffuse intraductal papillomatosis 09/21/2019   Nausea with vomiting 01/29/2019   Total bilirubin, elevated 01/27/2019   UTI symptoms 01/27/2019   Acute diverticulitis 01/26/2019   Papilloma of right breast 09/12/2018   Malignant neoplasm of upper-outer quadrant of left breast in female, estrogen receptor positive 09/22/2017   Ductal carcinoma in situ (DCIS) of left breast 09/22/2017   Pain in the chest 11/12/2014   Essential hypertension 11/12/2014    is allergic to tetracycline, candida albicans, cephalexin, yeast, fenofibrate, glycerine [glycerin], levofloxacin, lunesta [eszopiclone], tetracyclines & related, and morphine.  MEDICAL HISTORY: Past Medical History:  Diagnosis Date   Abnormal liver function test    Arthritis    knees, hands & hips, back    Breast cancer 2019   Left Breast Cancer   Cancer    DCIS- L breast   Complication of anesthesia    Depression    post partum, also related to her in her 20's   Dyslipidemia    Esophageal spasm    rare occurence , /w ? reflux, no treatment or med. thus far   Headache    tension , sporadic    Hypertension    Hypertriglyceridemia    IBS (irritable bowel syndrome)    PONV (postoperative nausea and vomiting)    Sleep disturbance     SURGICAL HISTORY: Past Surgical History:  Procedure Laterality Date   BREAST BIOPSY Left 08/2017   BREAST BIOPSY Right 09/05/2018   BREAST BIOPSY Left 07/25/2020   BREAST BIOPSY Right 07/25/2020  x2   BREAST LUMPECTOMY Left 09/2017   BREAST LUMPECTOMY WITH RADIOACTIVE SEED LOCALIZATION Left 09/25/2017   Procedure: LEFT BREAST PARTIAL MASTECTOMY WITH RADIOACTIVE SEED LOCALIZATION ERAS PATHWAY;  Surgeon: Abigail Miyamoto, MD;  Location: [facility] OR;  Service: General;  Laterality:  Left;   BREAST REDUCTION SURGERY     CARDIOVASCULAR STRESS TEST     CESAREAN SECTION  1984   KNEE ARTHROSCOPY Right 2004   REDUCTION MAMMAPLASTY Bilateral 1997   ROOT CANAL     SHOULDER SURGERY Right    2012- arthroscopy   TONSILLECTOMY      SOCIAL HISTORY: Social History   Socioeconomic History   Marital status: Married    Spouse name: Not on file   Number of children: Not on file   Years of education: Not on file   Highest education level: Not on file  Occupational History   Not on file  Tobacco Use   Smoking status: Never   Smokeless tobacco: Never  Vaping Use   Vaping Use: Not on file  Substance and Sexual Activity   Alcohol use: No   Drug use: No   Sexual activity: Yes  Other Topics Concern   Not on file  Social History Narrative   Not on file   Social Determinants of Health   Financial Resource Strain: Not on file  Food Insecurity: No Food Insecurity (05/25/2022)   Hunger Vital Sign    Worried About Running Out of Food in the Last Year: Never true    Ran Out of Food in the Last Year: Never true  Transportation Needs: No Transportation Needs (05/25/2022)   PRAPARE - Administrator, Civil Service (Medical): No    Lack of Transportation (Non-Medical): No  Physical Activity: Not on file  Stress: Not on file  Social Connections: Not on file  Intimate Partner Violence: Not At Risk (05/25/2022)   Humiliation, Afraid, Rape, and Kick questionnaire    Fear of Current or Ex-Partner: No    Emotionally Abused: No    Physically Abused: No    Sexually Abused: No    FAMILY HISTORY: Family History  Problem Relation Age of Onset   Cancer Mother    Breast cancer Mother 20   Cancer Father     Review of Systems  Constitutional:  Negative for appetite change, chills, fatigue, fever and unexpected weight change.  HENT:   Negative for hearing loss, lump/mass and trouble swallowing.   Eyes:  Negative for eye problems and icterus.  Respiratory:  Negative  for chest tightness, cough and shortness of breath.   Cardiovascular:  Negative for chest pain, leg swelling and palpitations.  Gastrointestinal:  Negative for abdominal distention, abdominal pain, constipation, diarrhea, nausea and vomiting.  Endocrine: Negative for hot flashes.  Genitourinary:  Negative for difficulty urinating.   Musculoskeletal:  Negative for arthralgias.  Skin:  Negative for itching and rash.  Neurological:  Negative for dizziness, extremity weakness, headaches and numbness.  Hematological:  Negative for adenopathy. Does not bruise/bleed easily.  Psychiatric/Behavioral:  Negative for depression. The patient is not nervous/anxious.       PHYSICAL EXAMINATION Patient sounds well, she is in no apparent distress.  Her mood and behavior are normal.  Speech is normal. LABORATORY DATA: None for this visit      ASSESSMENT and THERAPY PLAN:   Malignant neoplasm of upper-outer quadrant of left breast in female, estrogen receptor positive (HCC) [name] is a 72 year old woman with history of left breast DCIS diagnosed in  January 2019 status post lumpectomy opted to forego adjuvant radiation and antiestrogen therapy.  This was followed by an intraductal papilloma and right breast ADH.  Breast cancer high risk:  Annual 3d Diagnostic mammogram with left breast ultrasound--ordered for 07/01/2023 Opted to forego intensified screening with breast MRI Understands need for breast self awareness  Annual f/u after breast mammogram  Discussed the above recommendations and through shared decision making came to the above conclusions.  We reviewed the risks and benefits of intensified screening and she understands that her risk may be slightly higher however it is understandable and reasonable for her to go with diagnostic 3D mammogram alone with left breast ultrasound to monitor the likely benign mass in the left breast with her breast density category A and desired to avoid false  positives that can happen on breast MRI.  She will return in November 2024 to review her mammogram results with Dr. Pamelia HoitGudena and will let us know in the interim if she develops any new breast concerns as she knows we will need to evaluate this promptly.   Follow up instructions:    -Return to cancer center in 06/2023  -Mammogram due in 06/2023    The patient was provided an opportunity to ask questions and all were answered. The patient agreed with the plan and demonstrated an understanding of the instructions.   The patient was advised to call back or seek an in-person evaluation if the symptoms worsen or if the condition fails to improve as anticipated.   I provided 15 minutes of non face-to-face telephone visit time during this encounter, and > 50% was spent counseling as documented under my assessment & plan.  Lillard AnesLindsey Lakenzie Mcclafferty, NP 11/20/22 9:10 AM Medical Oncology and Hematology [facility] [address] [place], [place] [address] Tel. [phone]    Fax. [phone]

## 2022-12-21 DIAGNOSIS — Z0000 Encounter for general adult medical examination without abnormal findings: Secondary | ICD-10-CM | POA: Diagnosis not present

## 2023-02-13 DIAGNOSIS — D0462 Carcinoma in situ of skin of left upper limb, including shoulder: Secondary | ICD-10-CM | POA: Diagnosis not present

## 2023-03-18 DIAGNOSIS — K449 Diaphragmatic hernia without obstruction or gangrene: Secondary | ICD-10-CM | POA: Diagnosis not present

## 2023-03-18 DIAGNOSIS — K5730 Diverticulosis of large intestine without perforation or abscess without bleeding: Secondary | ICD-10-CM | POA: Diagnosis not present

## 2023-03-18 DIAGNOSIS — K644 Residual hemorrhoidal skin tags: Secondary | ICD-10-CM | POA: Diagnosis not present

## 2023-05-06 DIAGNOSIS — L821 Other seborrheic keratosis: Secondary | ICD-10-CM | POA: Diagnosis not present

## 2023-05-06 DIAGNOSIS — D2239 Melanocytic nevi of other parts of face: Secondary | ICD-10-CM | POA: Diagnosis not present

## 2023-05-06 DIAGNOSIS — D225 Melanocytic nevi of trunk: Secondary | ICD-10-CM | POA: Diagnosis not present

## 2023-05-06 DIAGNOSIS — D2261 Melanocytic nevi of right upper limb, including shoulder: Secondary | ICD-10-CM | POA: Diagnosis not present

## 2023-05-06 DIAGNOSIS — Z85828 Personal history of other malignant neoplasm of skin: Secondary | ICD-10-CM | POA: Diagnosis not present

## 2023-05-20 DIAGNOSIS — R7301 Impaired fasting glucose: Secondary | ICD-10-CM | POA: Diagnosis not present

## 2023-05-20 DIAGNOSIS — K219 Gastro-esophageal reflux disease without esophagitis: Secondary | ICD-10-CM | POA: Diagnosis not present

## 2023-05-20 DIAGNOSIS — G479 Sleep disorder, unspecified: Secondary | ICD-10-CM | POA: Diagnosis not present

## 2023-05-20 DIAGNOSIS — E781 Pure hyperglyceridemia: Secondary | ICD-10-CM | POA: Diagnosis not present

## 2023-05-20 DIAGNOSIS — M85852 Other specified disorders of bone density and structure, left thigh: Secondary | ICD-10-CM | POA: Diagnosis not present

## 2023-05-20 DIAGNOSIS — R809 Proteinuria, unspecified: Secondary | ICD-10-CM | POA: Diagnosis not present

## 2023-05-20 DIAGNOSIS — Z23 Encounter for immunization: Secondary | ICD-10-CM | POA: Diagnosis not present

## 2023-05-20 DIAGNOSIS — I10 Essential (primary) hypertension: Secondary | ICD-10-CM | POA: Diagnosis not present

## 2023-05-20 DIAGNOSIS — M62838 Other muscle spasm: Secondary | ICD-10-CM | POA: Diagnosis not present

## 2023-06-05 ENCOUNTER — Telehealth: Payer: Self-pay | Admitting: Hematology and Oncology

## 2023-06-05 NOTE — Telephone Encounter (Signed)
Left patient a message in regards to rescheduled appointment times/dates due to Gudena being out of office on 11/29./2024

## 2023-07-01 ENCOUNTER — Ambulatory Visit
Admission: RE | Admit: 2023-07-01 | Discharge: 2023-07-01 | Disposition: A | Discharge status: Home / Self Care | Payer: Medicare Other | Source: Ambulatory Visit | Attending: Adult Health | Admitting: Adult Health

## 2023-07-01 ENCOUNTER — Encounter: Payer: Self-pay | Admitting: Hematology and Oncology

## 2023-07-01 DIAGNOSIS — N6325 Unspecified lump in the left breast, overlapping quadrants: Secondary | ICD-10-CM | POA: Diagnosis not present

## 2023-07-01 DIAGNOSIS — Z170 Estrogen receptor positive status [ER+]: Secondary | ICD-10-CM

## 2023-07-12 ENCOUNTER — Ambulatory Visit: Payer: Medicare Other | Admitting: Hematology and Oncology

## 2023-07-22 ENCOUNTER — Ambulatory Visit: Payer: Medicare Other | Admitting: Hematology and Oncology

## 2023-09-04 DIAGNOSIS — H43393 Other vitreous opacities, bilateral: Secondary | ICD-10-CM | POA: Diagnosis not present

## 2023-09-04 DIAGNOSIS — H25043 Posterior subcapsular polar age-related cataract, bilateral: Secondary | ICD-10-CM | POA: Diagnosis not present

## 2023-09-30 DIAGNOSIS — R0789 Other chest pain: Secondary | ICD-10-CM | POA: Diagnosis not present

## 2023-09-30 DIAGNOSIS — R42 Dizziness and giddiness: Secondary | ICD-10-CM | POA: Diagnosis not present

## 2023-10-22 ENCOUNTER — Encounter (HOSPITAL_BASED_OUTPATIENT_CLINIC_OR_DEPARTMENT_OTHER): Payer: Self-pay | Admitting: Orthopaedic Surgery

## 2023-10-22 ENCOUNTER — Telehealth: Payer: Self-pay

## 2023-10-22 NOTE — Telephone Encounter (Signed)
 VOB submitted for Durolane, right knee.

## 2023-10-29 ENCOUNTER — Other Ambulatory Visit: Payer: Self-pay

## 2023-10-29 DIAGNOSIS — M25561 Pain in right knee: Secondary | ICD-10-CM

## 2023-10-30 ENCOUNTER — Ambulatory Visit (HOSPITAL_BASED_OUTPATIENT_CLINIC_OR_DEPARTMENT_OTHER): Admitting: Orthopaedic Surgery

## 2023-10-30 ENCOUNTER — Encounter (HOSPITAL_BASED_OUTPATIENT_CLINIC_OR_DEPARTMENT_OTHER): Payer: Self-pay | Admitting: Orthopaedic Surgery

## 2023-10-30 ENCOUNTER — Ambulatory Visit (HOSPITAL_BASED_OUTPATIENT_CLINIC_OR_DEPARTMENT_OTHER)

## 2023-10-30 DIAGNOSIS — M25552 Pain in left hip: Secondary | ICD-10-CM | POA: Diagnosis not present

## 2023-10-30 DIAGNOSIS — M25561 Pain in right knee: Secondary | ICD-10-CM | POA: Diagnosis not present

## 2023-10-30 DIAGNOSIS — M7651 Patellar tendinitis, right knee: Secondary | ICD-10-CM | POA: Diagnosis not present

## 2023-10-30 DIAGNOSIS — M1711 Unilateral primary osteoarthritis, right knee: Secondary | ICD-10-CM | POA: Diagnosis not present

## 2023-10-30 MED ORDER — TRIAMCINOLONE ACETONIDE 40 MG/ML IJ SUSP
80.0000 mg | INTRAMUSCULAR | Status: AC | PRN
  Administered 2023-10-30: 80 mg via INTRA_ARTICULAR

## 2023-10-30 MED ORDER — LIDOCAINE HCL 1 % IJ SOLN
4.0000 mL | INTRAMUSCULAR | Status: AC | PRN
  Administered 2023-10-30: 4 mL

## 2023-10-30 NOTE — Progress Notes (Signed)
 Chief Complaint: Pain, left hip pain     History of Present Illness:    [name] is a 73 y.o. female presents today with worsening and longstanding right knee pain.  She has deviously had a right knee ACL reconstruction with arthroscopy in the 90s.  Endorses increasing pain about the medial aspect as well as patellofemoral joint.  Not had any previous injections into this knee.  She has very active and enjoys doing tai chi.  She has been experiencing pain in the left groin as well.  She has historically performed and participated in yoga although this has been limited as result of her pain.  She is currently retired    PMH/PSH/Family History/Social History/Meds/Allergies:    Past Medical History:  Diagnosis Date  . Abnormal liver function test   . Arthritis    knees, hands & hips, back   . Breast cancer (HCC) 2019   Left Breast Cancer  . Cancer (HCC)    DCIS- L breast  . Complication of anesthesia   . Depression    post partum, also related to her in her 31's  . Dyslipidemia   . Esophageal spasm    rare occurence , /w ? reflux, no treatment or med. thus far  . Headache    tension , sporadic   . Hypertension   . Hypertriglyceridemia   . IBS (irritable bowel syndrome)   . PONV (postoperative nausea and vomiting)   . Sleep disturbance    Past Surgical History:  Procedure Laterality Date  . BREAST BIOPSY Left 08/2017  . BREAST BIOPSY Right 09/05/2018  . BREAST BIOPSY Left 07/25/2020  . BREAST BIOPSY Right 07/25/2020   x2  . BREAST LUMPECTOMY Left 09/2017  . BREAST LUMPECTOMY WITH RADIOACTIVE SEED LOCALIZATION Left 09/25/2017   Procedure: LEFT BREAST PARTIAL MASTECTOMY WITH RADIOACTIVE SEED LOCALIZATION ERAS PATHWAY;  Surgeon: Abigail Miyamoto, MD;  Location: [facility] OR;  Service: General;  Laterality: Left;  . BREAST REDUCTION SURGERY    . CARDIOVASCULAR STRESS TEST    . CESAREAN SECTION  1984  . KNEE ARTHROSCOPY Right 2004  . REDUCTION MAMMAPLASTY  Bilateral 1997  . ROOT CANAL    . SHOULDER SURGERY Right    2012- arthroscopy  . TONSILLECTOMY     Social History   Socioeconomic History  . Marital status: Married    Spouse name: Not on file  . Number of children: Not on file  . Years of education: Not on file  . Highest education level: Not on file  Occupational History  . Not on file  Tobacco Use  . Smoking status: Never  . Smokeless tobacco: Never  Vaping Use  . Vaping status: Not on file  Substance and Sexual Activity  . Alcohol use: No  . Drug use: No  . Sexual activity: Yes  Other Topics Concern  . Not on file  Social History Narrative  . Not on file   Social Drivers of Health   Financial Resource Strain: Not on file  Food Insecurity: No Food Insecurity (05/25/2022)   Hunger Vital Sign   . Worried About Programme researcher, broadcasting/film/video in the Last Year: Never true   . Ran Out of Food in the Last Year: Never true  Transportation Needs: No Transportation Needs (05/25/2022)   PRAPARE - Transportation   . Lack of Transportation (Medical): No   . Lack of Transportation (Non-Medical): No  Physical Activity: Not on file  Stress: Not on file  Social Connections:  Not on file   Family History  Problem Relation Age of Onset  . Cancer Mother   . Breast cancer Mother 47  . Cancer Father    Allergies  Allergen Reactions  . Tetracycline Nausea And Vomiting and Other (See Comments)    Abdominal pain  . Candida Albicans Nausea And Vomiting and Other (See Comments)    Abdominal pain  . Cephalexin Nausea And Vomiting and Other (See Comments)    Abdominal pain  . Yeast Nausea And Vomiting and Other (See Comments)    Abdominal pain  . Fenofibrate Other (See Comments)    Unknown reaction  . Glycerine [Glycerin] Nausea And Vomiting  . Levofloxacin Other (See Comments)    "Destroyed her gut bacteria"  . Tetracyclines & Related Nausea And Vomiting  . Morphine Nausea And Vomiting   Current Outpatient Medications  Medication Sig  Dispense Refill  . ALEVE 220 MG tablet Take 220-440 mg by mouth 2 (two) times daily as needed (for pain).    Marland Kitchen amLODipine (NORVASC) 2.5 MG tablet Take 2.5 mg by mouth daily after supper.    Marland Kitchen BISACODYL 5 MG EC tablet Take by mouth.    . diclofenac sodium (VOLTAREN) 1 % GEL Apply 2 g topically 4 (four) times daily.    Marland Kitchen EXCEDRIN TENSION HEADACHE 500-65 MG TABS per tablet Take 1 tablet by mouth every 6 (six) hours as needed (for headaches).    . Glucosamine-Chondroitin-MSM 500-400-125 MG TABS Take 2 tablets by mouth daily.    . irbesartan-hydrochlorothiazide (AVALIDE) 150-12.5 MG tablet Take 1 tablet by mouth daily.    . Multiple Vitamins-Minerals (MULTIVITAMIN WOMEN 50+) TABS Take 1 tablet by mouth daily with breakfast.    . omeprazole (PRILOSEC) 20 MG capsule Take 20 mg by mouth daily before breakfast.    . ondansetron (ZOFRAN ODT) 4 MG disintegrating tablet Take 1 tablet (4 mg total) by mouth every 8 (eight) hours as needed for nausea or vomiting. 12 tablet 1  . polyethylene glycol (MIRALAX / GLYCOLAX) 17 g packet Take 17 g by mouth daily as needed for moderate constipation or mild constipation. 14 each 0  . polyethylene glycol-electrolytes (NULYTELY) 420 g solution Take by mouth as directed.    . Probiotic Product (PROBIOTIC DAILY PO) Take 1 capsule by mouth daily.    . Simethicone (PHAZYME) 180 MG CAPS Take 180-360 mg by mouth daily as needed (for gas).    . zaleplon (SONATA) 10 MG capsule Take 10 mg by mouth at bedtime as needed for sleep.    Marland Kitchen Zoster Vaccine Adjuvanted [facility]) injection Inject 0.5 mLs into the muscle once.     No current facility-administered medications for this visit.   No results found.  Review of Systems:   A ROS was performed including pertinent positives and negatives as documented in the HPI.  Physical Exam :   Constitutional: NAD and appears stated age Neurological: Alert and oriented Psych: Appropriate affect and cooperative There were no vitals taken  for this visit.   Comprehensive Musculoskeletal Exam:    Tenderness about the medial patellofemoral joint on the right with crepitus with range of motion from 0 to 120 degrees, negative effusion previous portals are well-healed  Left shoulder with tenderness about the femoral acetabular joint with pain with internal rotation of 30 degrees.   Imaging:   Xray (4 views right knee, 3 views left hip): Moderate left hip osteoarthritis, moderate right knee medial patellofemoral osteoarthritis     I personally reviewed and interpreted  the radiographs.   Assessment and Plan:   73 y.o. female with right knee osteoarthritis as well as left hip mild osteoarthritis.  I did describe that I would ultimately recommend initial ultrasound-guided injection of the right knee.  I do believe that she is ultimately experiencing left hip pain as a result of overuse of the left side as a result of her knee pain.  Will plan to begin with an injection of the right knee.  She will follow-up with [place] should she want to pursue an additional left hip injection  -Right knee injection provided after verbal consent obtained    Procedure Note  Patient: [name]             Date of Birth: [birth year]           MRN: [id number]             Visit Date: 10/30/2023  Procedures: Visit Diagnoses:  1. Acute pain of right knee   2. Pain in left hip     Large Joint Inj: R knee on 10/30/2023 10:57 AM Indications: pain Details: 22 G 1.5 in needle, ultrasound-guided anterior approach  Arthrogram: No  Medications: 4 mL lidocaine 1 %; 80 mg triamcinolone acetonide 40 MG/ML Outcome: tolerated well, no immediate complications Procedure, treatment alternatives, risks and benefits explained, specific risks discussed. Consent was given by the patient. Immediately prior to procedure a time out was called to verify the correct patient, procedure, equipment, support staff and site/side marked as required. Patient was  prepped and draped in the usual sterile fashion.        I personally saw and evaluated the patient, and participated in the management and treatment plan.  Huel Cote, MD Attending Physician, Orthopedic Surgery  This document was dictated using Dragon voice recognition software. A reasonable attempt at proof reading has been made to minimize errors.

## 2023-11-11 ENCOUNTER — Encounter (HOSPITAL_BASED_OUTPATIENT_CLINIC_OR_DEPARTMENT_OTHER): Payer: Self-pay | Admitting: Orthopaedic Surgery

## 2023-11-13 DIAGNOSIS — M9903 Segmental and somatic dysfunction of lumbar region: Secondary | ICD-10-CM | POA: Diagnosis not present

## 2023-11-13 DIAGNOSIS — M9904 Segmental and somatic dysfunction of sacral region: Secondary | ICD-10-CM | POA: Diagnosis not present

## 2023-11-13 DIAGNOSIS — M9905 Segmental and somatic dysfunction of pelvic region: Secondary | ICD-10-CM | POA: Diagnosis not present

## 2023-11-13 DIAGNOSIS — M5136 Other intervertebral disc degeneration, lumbar region with discogenic back pain only: Secondary | ICD-10-CM | POA: Diagnosis not present

## 2023-11-14 DIAGNOSIS — M9904 Segmental and somatic dysfunction of sacral region: Secondary | ICD-10-CM | POA: Diagnosis not present

## 2023-11-14 DIAGNOSIS — M9903 Segmental and somatic dysfunction of lumbar region: Secondary | ICD-10-CM | POA: Diagnosis not present

## 2023-11-14 DIAGNOSIS — M9905 Segmental and somatic dysfunction of pelvic region: Secondary | ICD-10-CM | POA: Diagnosis not present

## 2023-11-14 DIAGNOSIS — M5136 Other intervertebral disc degeneration, lumbar region with discogenic back pain only: Secondary | ICD-10-CM | POA: Diagnosis not present

## 2023-11-18 DIAGNOSIS — M9905 Segmental and somatic dysfunction of pelvic region: Secondary | ICD-10-CM | POA: Diagnosis not present

## 2023-11-18 DIAGNOSIS — M5136 Other intervertebral disc degeneration, lumbar region with discogenic back pain only: Secondary | ICD-10-CM | POA: Diagnosis not present

## 2023-11-18 DIAGNOSIS — M9904 Segmental and somatic dysfunction of sacral region: Secondary | ICD-10-CM | POA: Diagnosis not present

## 2023-11-18 DIAGNOSIS — M9903 Segmental and somatic dysfunction of lumbar region: Secondary | ICD-10-CM | POA: Diagnosis not present

## 2023-11-21 ENCOUNTER — Encounter (HOSPITAL_BASED_OUTPATIENT_CLINIC_OR_DEPARTMENT_OTHER): Payer: Self-pay | Admitting: Orthopaedic Surgery

## 2023-11-21 ENCOUNTER — Telehealth (HOSPITAL_BASED_OUTPATIENT_CLINIC_OR_DEPARTMENT_OTHER): Admitting: Orthopaedic Surgery

## 2023-11-21 DIAGNOSIS — M25561 Pain in right knee: Secondary | ICD-10-CM

## 2023-11-21 NOTE — Progress Notes (Signed)
 Chief Complaint: Right knee pain, left hip pain    Patient Location:[address]  [name] [id number]  My location: [address], [place], [address] MyChart video visit   History of Present Illness:   11/21/2023: Presents for follow-up of her right knee.  She got extremely good relief from her injection.  She is experiencing quite good relief at this time  [name] is a 73 y.o. female presents today with worsening and longstanding right knee pain.  She has deviously had a right knee ACL reconstruction with arthroscopy in the 90s.  Endorses increasing pain about the medial aspect as well as patellofemoral joint.  Not had any previous injections into this knee.  She has very active and enjoys doing tai chi.  She has been experiencing pain in the left groin as well.  She has historically performed and participated in yoga although this has been limited as result of her pain.  She is currently retired    PMH/PSH/Family History/Social History/Meds/Allergies:    Past Medical History:  Diagnosis Date   Abnormal liver function test    Arthritis    knees, hands & hips, back    Breast cancer (HCC) 2019   Left Breast Cancer   Cancer (HCC)    DCIS- L breast   Complication of anesthesia    Depression    post partum, also related to her in her 20's   Dyslipidemia    Esophageal spasm    rare occurence , /w ? reflux, no treatment or med. thus far   Headache    tension , sporadic    Hypertension    Hypertriglyceridemia    IBS (irritable bowel syndrome)    PONV (postoperative nausea and vomiting)    Sleep disturbance    Past Surgical History:  Procedure Laterality Date   BREAST BIOPSY Left 08/2017   BREAST BIOPSY Right 09/05/2018   BREAST BIOPSY Left 07/25/2020   BREAST BIOPSY Right 07/25/2020   x2   BREAST LUMPECTOMY Left 09/2017   BREAST LUMPECTOMY WITH RADIOACTIVE SEED LOCALIZATION Left 09/25/2017   Procedure: LEFT BREAST  PARTIAL MASTECTOMY WITH RADIOACTIVE SEED LOCALIZATION ERAS PATHWAY;  Surgeon: Abigail Miyamoto, MD;  Location: [facility] OR;  Service: General;  Laterality: Left;   BREAST REDUCTION SURGERY     CARDIOVASCULAR STRESS TEST     CESAREAN SECTION  1984   KNEE ARTHROSCOPY Right 2004   REDUCTION MAMMAPLASTY Bilateral 1997   ROOT CANAL     SHOULDER SURGERY Right    2012- arthroscopy   TONSILLECTOMY     Social History   Socioeconomic History   Marital status: Married    Spouse name: Not on file   Number of children: Not on file   Years of education: Not on file   Highest education level: Not on file  Occupational History   Not on file  Tobacco Use   Smoking status: Never   Smokeless tobacco: Never  Vaping Use   Vaping status: Not on file  Substance and Sexual Activity   Alcohol use: No   Drug use: No   Sexual activity: Yes  Other Topics Concern   Not on file  Social History Narrative   Not on file   Social Drivers of Health   Financial Resource Strain: Not on file  Food Insecurity: No Food Insecurity (05/25/2022)   Hunger Vital Sign    Worried About Running Out of Food in the Last Year: Never true  Ran Out of Food in the Last Year: Never true  Transportation Needs: No Transportation Needs (05/25/2022)   PRAPARE - Administrator, Civil Service (Medical): No    Lack of Transportation (Non-Medical): No  Physical Activity: Not on file  Stress: Not on file  Social Connections: Not on file   Family History  Problem Relation Age of Onset   Cancer Mother    Breast cancer Mother 52   Cancer Father    Allergies  Allergen Reactions   Tetracycline Nausea And Vomiting and Other (See Comments)    Abdominal pain   Candida Albicans Nausea And Vomiting and Other (See Comments)    Abdominal pain   Cephalexin Nausea And Vomiting and Other (See Comments)    Abdominal pain   Yeast Nausea And Vomiting and Other (See Comments)    Abdominal pain   Fenofibrate Other (See  Comments)    Unknown reaction   Glycerine [Glycerin] Nausea And Vomiting   Levofloxacin Other (See Comments)    "Destroyed her gut bacteria"   Tetracyclines & Related Nausea And Vomiting   Morphine Nausea And Vomiting   Current Outpatient Medications  Medication Sig Dispense Refill   ALEVE 220 MG tablet Take 220-440 mg by mouth 2 (two) times daily as needed (for pain).     amLODipine (NORVASC) 2.5 MG tablet Take 2.5 mg by mouth daily after supper.     BISACODYL 5 MG EC tablet Take by mouth.     diclofenac sodium (VOLTAREN) 1 % GEL Apply 2 g topically 4 (four) times daily.     EXCEDRIN TENSION HEADACHE 500-65 MG TABS per tablet Take 1 tablet by mouth every 6 (six) hours as needed (for headaches).     Glucosamine-Chondroitin-MSM 500-400-125 MG TABS Take 2 tablets by mouth daily.     irbesartan-hydrochlorothiazide (AVALIDE) 150-12.5 MG tablet Take 1 tablet by mouth daily.     Multiple Vitamins-Minerals (MULTIVITAMIN WOMEN 50+) TABS Take 1 tablet by mouth daily with breakfast.     omeprazole (PRILOSEC) 20 MG capsule Take 20 mg by mouth daily before breakfast.     ondansetron (ZOFRAN ODT) 4 MG disintegrating tablet Take 1 tablet (4 mg total) by mouth every 8 (eight) hours as needed for nausea or vomiting. 12 tablet 1   polyethylene glycol (MIRALAX / GLYCOLAX) 17 g packet Take 17 g by mouth daily as needed for moderate constipation or mild constipation. 14 each 0   polyethylene glycol-electrolytes (NULYTELY) 420 g solution Take by mouth as directed.     Probiotic Product (PROBIOTIC DAILY PO) Take 1 capsule by mouth daily.     Simethicone (PHAZYME) 180 MG CAPS Take 180-360 mg by mouth daily as needed (for gas).     zaleplon (SONATA) 10 MG capsule Take 10 mg by mouth at bedtime as needed for sleep.     Zoster Vaccine Adjuvanted [facility]) injection Inject 0.5 mLs into the muscle once.     No current facility-administered medications for this visit.   No results found.  Review of Systems:   A  ROS was performed including pertinent positives and negatives as documented in the HPI.  Physical Exam :   Constitutional: NAD and appears stated age Neurological: Alert and oriented Psych: Appropriate affect and cooperative There were no vitals taken for this visit.   Comprehensive Musculoskeletal Exam:    Tenderness about the medial patellofemoral joint on the right with crepitus with range of motion from 0 to 120 degrees, negative effusion previous portals are  well-healed  Left shoulder with tenderness about the femoral acetabular joint with pain with internal rotation of 30 degrees.   Imaging:   Xray (4 views right knee, 3 views left hip): Moderate left hip osteoarthritis, moderate right knee medial patellofemoral osteoarthritis     I personally reviewed and interpreted the radiographs.   Assessment and Plan:   73 y.o. female with right knee osteoarthritis as well as left hip mild osteoarthritis.  She has gotten extremely good relief from her previous injection.  At this time I have advised that I would like her to schedule a follow up when this injection wears off and we can further discuss options.  She does have medial compartment isolated arthritis and she may ultimately be a candidate for partial knee replacement although we will defer surgery discussion for now      I personally saw and evaluated the patient, and participated in the management and treatment plan.  Huel Cote, MD Attending Physician, Orthopedic Surgery  This document was dictated using Dragon voice recognition software. A reasonable attempt at proof reading has been made to minimize errors.

## 2023-11-22 DIAGNOSIS — C50412 Malignant neoplasm of upper-outer quadrant of left female breast: Secondary | ICD-10-CM | POA: Diagnosis not present

## 2023-11-22 DIAGNOSIS — M85851 Other specified disorders of bone density and structure, right thigh: Secondary | ICD-10-CM | POA: Diagnosis not present

## 2023-11-22 DIAGNOSIS — K219 Gastro-esophageal reflux disease without esophagitis: Secondary | ICD-10-CM | POA: Diagnosis not present

## 2023-11-22 DIAGNOSIS — E781 Pure hyperglyceridemia: Secondary | ICD-10-CM | POA: Diagnosis not present

## 2023-11-22 DIAGNOSIS — M85852 Other specified disorders of bone density and structure, left thigh: Secondary | ICD-10-CM | POA: Diagnosis not present

## 2023-11-22 DIAGNOSIS — G479 Sleep disorder, unspecified: Secondary | ICD-10-CM | POA: Diagnosis not present

## 2023-11-22 DIAGNOSIS — R7301 Impaired fasting glucose: Secondary | ICD-10-CM | POA: Diagnosis not present

## 2023-11-22 DIAGNOSIS — I10 Essential (primary) hypertension: Secondary | ICD-10-CM | POA: Diagnosis not present

## 2024-01-09 DIAGNOSIS — M9903 Segmental and somatic dysfunction of lumbar region: Secondary | ICD-10-CM | POA: Diagnosis not present

## 2024-01-09 DIAGNOSIS — M9904 Segmental and somatic dysfunction of sacral region: Secondary | ICD-10-CM | POA: Diagnosis not present

## 2024-01-09 DIAGNOSIS — M9905 Segmental and somatic dysfunction of pelvic region: Secondary | ICD-10-CM | POA: Diagnosis not present

## 2024-01-09 DIAGNOSIS — M51361 Other intervertebral disc degeneration, lumbar region with lower extremity pain only: Secondary | ICD-10-CM | POA: Diagnosis not present

## 2024-01-13 DIAGNOSIS — M9904 Segmental and somatic dysfunction of sacral region: Secondary | ICD-10-CM | POA: Diagnosis not present

## 2024-01-13 DIAGNOSIS — M51361 Other intervertebral disc degeneration, lumbar region with lower extremity pain only: Secondary | ICD-10-CM | POA: Diagnosis not present

## 2024-01-13 DIAGNOSIS — M9903 Segmental and somatic dysfunction of lumbar region: Secondary | ICD-10-CM | POA: Diagnosis not present

## 2024-01-13 DIAGNOSIS — M9905 Segmental and somatic dysfunction of pelvic region: Secondary | ICD-10-CM | POA: Diagnosis not present

## 2024-01-16 ENCOUNTER — Encounter (HOSPITAL_BASED_OUTPATIENT_CLINIC_OR_DEPARTMENT_OTHER): Payer: Self-pay | Admitting: Orthopaedic Surgery

## 2024-01-22 ENCOUNTER — Telehealth (HOSPITAL_BASED_OUTPATIENT_CLINIC_OR_DEPARTMENT_OTHER): Payer: Self-pay | Admitting: Orthopaedic Surgery

## 2024-01-22 NOTE — Telephone Encounter (Signed)
 Patient wants to know if she can get an injections for her hip and her knee at the same time.

## 2024-01-22 NOTE — Telephone Encounter (Signed)
 After verbal discussion with Dr. Hermina Loosen, I called and informed pt she can have injections as long as she is not diabetic. Patient requested earlier appointment with Cleora Daft, so I scheduled her with Cleora Daft

## 2024-01-23 ENCOUNTER — Encounter (HOSPITAL_BASED_OUTPATIENT_CLINIC_OR_DEPARTMENT_OTHER): Payer: Self-pay | Admitting: Student

## 2024-01-23 ENCOUNTER — Other Ambulatory Visit (HOSPITAL_BASED_OUTPATIENT_CLINIC_OR_DEPARTMENT_OTHER): Payer: Self-pay

## 2024-01-23 ENCOUNTER — Ambulatory Visit (HOSPITAL_BASED_OUTPATIENT_CLINIC_OR_DEPARTMENT_OTHER): Admitting: Student

## 2024-01-23 DIAGNOSIS — M1711 Unilateral primary osteoarthritis, right knee: Secondary | ICD-10-CM

## 2024-01-23 DIAGNOSIS — M25561 Pain in right knee: Secondary | ICD-10-CM

## 2024-01-23 DIAGNOSIS — M25552 Pain in left hip: Secondary | ICD-10-CM | POA: Diagnosis not present

## 2024-01-23 DIAGNOSIS — M1612 Unilateral primary osteoarthritis, left hip: Secondary | ICD-10-CM | POA: Diagnosis not present

## 2024-01-23 MED ORDER — TRIAMCINOLONE ACETONIDE 40 MG/ML IJ SUSP
2.0000 mL | INTRAMUSCULAR | Status: AC | PRN
  Administered 2024-01-23: 2 mL via INTRA_ARTICULAR

## 2024-01-23 MED ORDER — LIDOCAINE HCL 1 % IJ SOLN
4.0000 mL | INTRAMUSCULAR | Status: AC | PRN
  Administered 2024-01-23: 4 mL

## 2024-01-23 NOTE — Progress Notes (Addendum)
 Chief Complaint: Right knee and left hip pain     History of Present Illness:   01/23/24: Patient presents to clinic for follow-up evaluation of right knee and left hip pain.  She last received a cortisone injection in March of the right knee which did get her relief however she reports this has recently begun wearing off.  Knee pain is worse medially.  Also reports pain in the lateral and groin area of the left hip.  She last had x-rays on 3/19 which showed moderate left hip osteoarthritis.  Has been managing with ice and exercises, although her pain has been limiting her activity level.   [name] is a 73 y.o. female presents today with worsening and longstanding right knee pain.  She has previously had a right knee ACL reconstruction with arthroscopy in the 90s.  Endorses increasing pain about the medial aspect as well as patellofemoral joint.  Not had any previous injections into this knee.  She has very active and enjoys doing tai chi.  She has been experiencing pain in the left groin as well.  She has historically performed and participated in yoga although this has been limited as result of her pain.  She is currently retired.    PMH/PSH/Family History/Social History/Meds/Allergies:    Past Medical History:  Diagnosis Date   Abnormal liver function test    Arthritis    knees, hands & hips, back    Breast cancer (HCC) 2019   Left Breast Cancer   Cancer (HCC)    DCIS- L breast   Complication of anesthesia    Depression    post partum, also related to her in her 20's   Dyslipidemia    Esophageal spasm    rare occurence , /w ? reflux, no treatment or med. thus far   Headache    tension , sporadic    Hypertension    Hypertriglyceridemia    IBS (irritable bowel syndrome)    PONV (postoperative nausea and vomiting)    Sleep disturbance    Past Surgical History:  Procedure Laterality Date   BREAST BIOPSY Left 08/2017   BREAST BIOPSY Right 09/05/2018    BREAST BIOPSY Left 07/25/2020   BREAST BIOPSY Right 07/25/2020   x2   BREAST LUMPECTOMY Left 09/2017   BREAST LUMPECTOMY WITH RADIOACTIVE SEED LOCALIZATION Left 09/25/2017   Procedure: LEFT BREAST PARTIAL MASTECTOMY WITH RADIOACTIVE SEED LOCALIZATION ERAS PATHWAY;  Surgeon: Oza Blumenthal, MD;  Location: [facility] OR;  Service: General;  Laterality: Left;   BREAST REDUCTION SURGERY     CARDIOVASCULAR STRESS TEST     CESAREAN SECTION  1984   KNEE ARTHROSCOPY Right 2004   REDUCTION MAMMAPLASTY Bilateral 1997   ROOT CANAL     SHOULDER SURGERY Right    2012- arthroscopy   TONSILLECTOMY     Social History   Socioeconomic History   Marital status: Married    Spouse name: Not on file   Number of children: Not on file   Years of education: Not on file   Highest education level: Not on file  Occupational History   Not on file  Tobacco Use   Smoking status: Never   Smokeless tobacco: Never  Vaping Use   Vaping status: Not on file  Substance and Sexual Activity   Alcohol use: No   Drug use: No   Sexual activity: Yes  Other Topics Concern   Not on file  Social History Narrative   Not on file  Social Drivers of Corporate investment banker Strain: Not on file  Food Insecurity: No Food Insecurity (05/25/2022)   Hunger Vital Sign    Worried About Running Out of Food in the Last Year: Never true    Ran Out of Food in the Last Year: Never true  Transportation Needs: No Transportation Needs (05/25/2022)   PRAPARE - Administrator, Civil Service (Medical): No    Lack of Transportation (Non-Medical): No  Physical Activity: Not on file  Stress: Not on file  Social Connections: Not on file   Family History  Problem Relation Age of Onset   Cancer Mother    Breast cancer Mother 85   Cancer Father    Allergies  Allergen Reactions   Tetracycline Nausea And Vomiting and Other (See Comments)    Abdominal pain   Candida Albicans Nausea And Vomiting and Other (See Comments)     Abdominal pain   Cephalexin Nausea And Vomiting and Other (See Comments)    Abdominal pain   Yeast Nausea And Vomiting and Other (See Comments)    Abdominal pain   Fenofibrate Other (See Comments)    Unknown reaction   Glycerine [Glycerin] Nausea And Vomiting   Levofloxacin Other (See Comments)    Destroyed her gut bacteria   Tetracyclines & Related Nausea And Vomiting   Morphine  Nausea And Vomiting   Current Outpatient Medications  Medication Sig Dispense Refill   ALEVE  220 MG tablet Take 220-440 mg by mouth 2 (two) times daily as needed (for pain).     amLODipine  (NORVASC ) 2.5 MG tablet Take 2.5 mg by mouth daily after supper.     BISACODYL  5 MG EC tablet Take by mouth.     diclofenac  sodium (VOLTAREN ) 1 % GEL Apply 2 g topically 4 (four) times daily.     EXCEDRIN TENSION HEADACHE 500-65 MG TABS per tablet Take 1 tablet by mouth every 6 (six) hours as needed (for headaches).     Glucosamine-Chondroitin-MSM 500-400-125 MG TABS Take 2 tablets by mouth daily.     irbesartan-hydrochlorothiazide (AVALIDE) 150-12.5 MG tablet Take 1 tablet by mouth daily.     Multiple Vitamins-Minerals (MULTIVITAMIN WOMEN 50+) TABS Take 1 tablet by mouth daily with breakfast.     omeprazole (PRILOSEC) 20 MG capsule Take 20 mg by mouth daily before breakfast.     ondansetron  (ZOFRAN  ODT) 4 MG disintegrating tablet Take 1 tablet (4 mg total) by mouth every 8 (eight) hours as needed for nausea or vomiting. 12 tablet 1   polyethylene glycol (MIRALAX  / GLYCOLAX ) 17 g packet Take 17 g by mouth daily as needed for moderate constipation or mild constipation. 14 each 0   polyethylene glycol-electrolytes (NULYTELY ) 420 g solution Take by mouth as directed.     Probiotic Product (PROBIOTIC DAILY PO) Take 1 capsule by mouth daily.     Simethicone  (PHAZYME) 180 MG CAPS Take 180-360 mg by mouth daily as needed (for gas).     zaleplon (SONATA) 10 MG capsule Take 10 mg by mouth at bedtime as needed for sleep.      Zoster Vaccine Adjuvanted [facility]) injection Inject 0.5 mLs into the muscle once.     No current facility-administered medications for this visit.   No results found.  Review of Systems:   A ROS was performed including pertinent positives and negatives as documented in the HPI.  Physical Exam :   Constitutional: NAD and appears stated age Neurological: Alert and oriented Psych: Appropriate affect and  cooperative There were no vitals taken for this visit.   Comprehensive Musculoskeletal Exam:    Exam of the left hip demonstrates passive range of motion to 120 degrees flexion and 30 degrees of internal and external rotation.  Mild greater trochanter tenderness.  No overlying erythema or warmth.  Tenderness in the right knee over the medial joint line without overlying erythema or warmth.  Active range of motion is from 0 to 120 degrees.  No instability with varus or valgus stress.  Minimal effusion present.  Imaging:     Assessment and Plan:   73 y.o. female with evidence of osteoarthritis within the right knee and left hip, both which are currently symptomatic and interfering with her daily activities.  She has received an injection in the right knee which gave her not quite 3 months of relief, so I have offered to repeat this today but would recommend trialing viscosupplementation therefore will submit for authorization.  Hopefully this can give her some more long-lasting relief.  I do believe that she is also experiencing symptoms related to her hip osteoarthritis and would like to trial a cortisone injection into the hip.  Patient is agreeable and the hip joint was successfully injected under ultrasound guidance and she tolerated this well.  Knee injection also performed in clinic today.  Recommend following up for gel injection when approved once the cortisone begins wearing off.     Procedure Note  Patient: [name]             Date of Birth: [birth year]            MRN: [id number]             Visit Date: 01/23/2024  Procedures: Visit Diagnoses:  1. Pain in left hip     Large Joint Inj: L hip joint on 01/23/2024 12:05 PM Indications: pain Details: 22 G 3.5 in needle, ultrasound-guided anterior approach Medications: 4 mL lidocaine  1 %; 2 mL triamcinolone  acetonide 40 MG/ML Outcome: tolerated well, no immediate complications Procedure, treatment alternatives, risks and benefits explained, specific risks discussed. Consent was given by the patient. Immediately prior to procedure a time out was called to verify the correct patient, procedure, equipment, support staff and site/side marked as required. Patient was prepped and draped in the usual sterile fashion.    Large Joint Inj: R knee on 01/23/2024 12:06 PM Indications: pain Details: 22 G 1.5 in needle, anterolateral approach Medications: 4 mL lidocaine  1 %; 2 mL triamcinolone  acetonide 40 MG/ML Outcome: tolerated well, no immediate complications Procedure, treatment alternatives, risks and benefits explained, specific risks discussed. Consent was given by the patient. Immediately prior to procedure a time out was called to verify the correct patient, procedure, equipment, support staff and site/side marked as required. Patient was prepped and draped in the usual sterile fashion.       I personally saw and evaluated the patient, and participated in the management and treatment plan.   Sharrell Deck, PA-C Orthopedics  This document was dictated using Conservation officer, historic buildings. A reasonable attempt at proof reading has been made to minimize errors.

## 2024-01-28 DIAGNOSIS — M9905 Segmental and somatic dysfunction of pelvic region: Secondary | ICD-10-CM | POA: Diagnosis not present

## 2024-01-28 DIAGNOSIS — M9904 Segmental and somatic dysfunction of sacral region: Secondary | ICD-10-CM | POA: Diagnosis not present

## 2024-01-28 DIAGNOSIS — M51361 Other intervertebral disc degeneration, lumbar region with lower extremity pain only: Secondary | ICD-10-CM | POA: Diagnosis not present

## 2024-01-28 DIAGNOSIS — M9903 Segmental and somatic dysfunction of lumbar region: Secondary | ICD-10-CM | POA: Diagnosis not present

## 2024-02-07 ENCOUNTER — Ambulatory Visit (HOSPITAL_BASED_OUTPATIENT_CLINIC_OR_DEPARTMENT_OTHER): Admitting: Orthopaedic Surgery

## 2024-02-10 DIAGNOSIS — M9903 Segmental and somatic dysfunction of lumbar region: Secondary | ICD-10-CM | POA: Diagnosis not present

## 2024-02-10 DIAGNOSIS — M9905 Segmental and somatic dysfunction of pelvic region: Secondary | ICD-10-CM | POA: Diagnosis not present

## 2024-02-10 DIAGNOSIS — M51361 Other intervertebral disc degeneration, lumbar region with lower extremity pain only: Secondary | ICD-10-CM | POA: Diagnosis not present

## 2024-02-10 DIAGNOSIS — M9904 Segmental and somatic dysfunction of sacral region: Secondary | ICD-10-CM | POA: Diagnosis not present

## 2024-02-13 DIAGNOSIS — R1010 Upper abdominal pain, unspecified: Secondary | ICD-10-CM | POA: Diagnosis not present

## 2024-02-13 DIAGNOSIS — Z6824 Body mass index (BMI) 24.0-24.9, adult: Secondary | ICD-10-CM | POA: Diagnosis not present

## 2024-02-13 DIAGNOSIS — G479 Sleep disorder, unspecified: Secondary | ICD-10-CM | POA: Diagnosis not present

## 2024-02-13 DIAGNOSIS — I10 Essential (primary) hypertension: Secondary | ICD-10-CM | POA: Diagnosis not present

## 2024-02-24 ENCOUNTER — Other Ambulatory Visit (HOSPITAL_BASED_OUTPATIENT_CLINIC_OR_DEPARTMENT_OTHER): Payer: Self-pay | Admitting: Orthopaedic Surgery

## 2024-02-24 ENCOUNTER — Telehealth (HOSPITAL_BASED_OUTPATIENT_CLINIC_OR_DEPARTMENT_OTHER): Payer: Self-pay | Admitting: Orthopaedic Surgery

## 2024-02-24 DIAGNOSIS — M25552 Pain in left hip: Secondary | ICD-10-CM

## 2024-02-24 DIAGNOSIS — M1711 Unilateral primary osteoarthritis, right knee: Secondary | ICD-10-CM

## 2024-02-24 DIAGNOSIS — M1612 Unilateral primary osteoarthritis, left hip: Secondary | ICD-10-CM

## 2024-02-24 NOTE — Telephone Encounter (Signed)
 Patient wants to know if she can get in to see Dr B before Aug 6 please advise [phone]

## 2024-02-24 NOTE — Telephone Encounter (Signed)
 Order placed to Dr. Burnetta

## 2024-02-24 NOTE — Telephone Encounter (Signed)
 Responded via [employer]

## 2024-02-25 ENCOUNTER — Ambulatory Visit: Admitting: Sports Medicine

## 2024-02-25 ENCOUNTER — Encounter: Payer: Self-pay | Admitting: Sports Medicine

## 2024-02-25 ENCOUNTER — Other Ambulatory Visit: Payer: Self-pay

## 2024-02-25 DIAGNOSIS — M25552 Pain in left hip: Secondary | ICD-10-CM | POA: Diagnosis not present

## 2024-02-25 DIAGNOSIS — M160 Bilateral primary osteoarthritis of hip: Secondary | ICD-10-CM | POA: Diagnosis not present

## 2024-02-25 DIAGNOSIS — M1612 Unilateral primary osteoarthritis, left hip: Secondary | ICD-10-CM | POA: Diagnosis not present

## 2024-02-25 DIAGNOSIS — R29898 Other symptoms and signs involving the musculoskeletal system: Secondary | ICD-10-CM

## 2024-02-25 DIAGNOSIS — G8929 Other chronic pain: Secondary | ICD-10-CM

## 2024-02-25 DIAGNOSIS — Z0000 Encounter for general adult medical examination without abnormal findings: Secondary | ICD-10-CM | POA: Diagnosis not present

## 2024-02-25 MED ORDER — METHYLPREDNISOLONE ACETATE 40 MG/ML IJ SUSP
80.0000 mg | INTRAMUSCULAR | Status: AC | PRN
  Administered 2024-02-25: 80 mg via INTRA_ARTICULAR

## 2024-02-25 MED ORDER — LIDOCAINE HCL 1 % IJ SOLN
4.0000 mL | INTRAMUSCULAR | Status: AC | PRN
  Administered 2024-02-25: 4 mL

## 2024-02-25 NOTE — Progress Notes (Addendum)
 [name] - 73 y.o. female MRN [id number]  Date of birth: 1950-11-14  Office Visit Note: Visit Date: 02/25/2024 PCP: Aisha Harvey, MD Referred by: Genelle Standing, MD  Medical Resident, Sports Medicine Fellow - Attending Physician Addendum:   I have independently interviewed and examined the patient myself. I have discussed the above with the original author and agree with their documentation. My edits for correction/addition/clarification have been made, see any changes above and below.   In summary, very pleasant 73 year old female with acute on chronic left hip pain.  Did have a previous hip injection, but unsure the exact efficacy of intra-articular injection.  She does have mild to moderate arthritis of the left hip which is symptomatic, but also with concomitant lateral hip abduction weakness and gluteal insufficiency.  Through shared decision-making, we did proceed with ultrasound-guided left hip intra-articular injection.  Even minutes following the anesthetic injection, patient had significant relief of her pain.  Discussed postinjection protocol.  She will update me via MyChart messaging in 2 weeks her percentage of relief.  If for some reason she does not receive significant relief, we may consider further evaluation with MRI of the hip or further evaluation into her back pain.  I do think her hip abduction weakness is also contributing to her pain, for this we will send her to formalized physical therapy.  She may use ice/heat or over-the-counter anti-inflammatories as needed.  She does have tramadol  50 mg to take for breakthrough pain only.  Lonell Sprang, DO Primary Care Sports Medicine Physician  [facility] - Orthopedics   Subjective: Chief Complaint  Patient presents with  . Left Hip - Pain   HPI: [name] is a pleasant 73 y.o. female who presents today for evaluation of left hip pain.  Patient was referred by Dr. Genelle and his PA  Leonce.  Patient has had progressively worsening left hip pain for the last couple months.  Volunteers at [employer] and enjoys playing tennis but has been unable to perform her normal activities as she has been limited by pain.  Describes pain as inguinal in L groin with radiation to the posterior hip. Some pain over lateral hip but denies TTP.  Denies any radiation of pain down her leg.  Does have some low back pain which is managed by chiropractor.  Feels like this pain is unrelated to her back pain.  Has been using ibuprofen and Tylenol  for pain management which helps to some degree.  Pain got so severe yesterday she had to borrow some of her husbands tramadol .  Prolonged walking or standing makes pain worse.  Had previously had injection of L hip by Leonce Reveal but she does not feel like medicine went to the correct location.  Interested in repeat injection and open to other alternative therapies  Pertinent ROS were reviewed with the patient and found to be negative unless otherwise specified above in HPI.   Assessment & Plan: Visit Diagnoses:  1. Chronic left hip pain   2. Bilateral primary osteoarthritis of hip   3. Weakness of left hip    2+ conditions, one with active OA exacerbation and independent interpretation of imaging (x-ray) ordered by someone else  Plan: Reproduction of pain with left logroll, FADIR, and stinchfield testing.  Agree that majority of patient's left hip pain is coming from true intra-articular pathology.  Minimal TTP over greater trochanter. Low suspicion for lumbar radiculopathy as primary source of pain.   Patient agreeable to  undergo corticosteroid injection today.  Discussed possible need for MRI or further evaluation if pain does not respond appropriately to injection.  On strength testing patient also has left hip abductor weakness, specifically with L glute medius.  Also refer patient to therapy for additional strengthening and stability  work.  Will attempt to send referral to patient's assisted living facility for therapy but she is open to returning to [facility] for PT if needed.   Follow-up: Return for Patient will send me Mychart message update in 2 weeks.   Meds & Orders:  Meds ordered this encounter  Medications  . lidocaine  (XYLOCAINE ) 1 % (with pres) injection 4 mL  . methylPREDNISolone  acetate (DEPO-MEDROL ) injection 80 mg    Orders Placed This Encounter  Procedures  . Large Joint Inj: L hip joint  . US  Guided Needle Placement - No Linked Charges  . Ambulatory referral to Physical Therapy     Procedures: Large Joint Inj: L hip joint on 02/25/2024 12:16 PM Indications: pain Details: 22 G 3.5 in needle, ultrasound-guided anterior approach Medications: 4 mL lidocaine  1 %; 80 mg methylPREDNISolone  acetate 40 MG/ML Outcome: tolerated well, no immediate complications  Procedure:US -guided intra-articular hip injection, Left After discussion on risks/benefits/indications and informed verbal consent was obtained, a timeout was performed. Patient was lyingsupine on exam table. The hip was cleaned with betadine andalcohol swabs. Then utilizing ultrasound guidance, the patient's femoral head and neck junction was identified and subsequently injected with4:2 lidocaine :depomedrol via an in-plane approach with ultrasound visualization of the injectate administered into the hip joint. Patient tolerated procedure well without immediate complications.  Procedure, treatment alternatives, risks and benefits explained, specific risks discussed. Consent was given by the patient. Immediately prior to procedure a time out was called to verify the correct patient, procedure, equipment, support staff and site/side marked as required. Patient was prepped and draped in the usual sterile fashion.          Clinical History: No specialty comments available.  She reports that she has never smoked. She has never used smokeless tobacco.  No results for input(s): HGBA1C, LABURIC in the last 8760 hours.  Objective:    Physical Exam  Gen: Well-appearing, in no acute distress; non-toxic CV: Well-perfused. Warm.  Resp: Breathing unlabored on room air; no wheezing. Psych: Fluid speech in conversation; appropriate affect; normal thought process  Ortho Exam - No evidence of erythema or ecchymosis over area of concern.  Minimal tenderness to palpation.  Range of motion limited by pain and guarding.  Reproduction of pain with left logroll, FADIR, and stinchfield testing.  Observed patient walking with antalgic ambulation.  Mild L hip drop present.  Weakness with left hip abductors when compared to right with associated pain.  Imaging:  *4 views of the left hip from 10/30/2023 were independently reviewed and interpreted by myself today.  AP pelvis, left hip lateral, frog-leg lateral were reviewed.  There is bilateral intra-articular joint space narrowing with right hip mild OA and left hip mild to moderate OA.  The left hip has small overhanging off the acetabular rim and inferior medial joint space narrowing with osteophytosis.  There is no severe or high-grade arthritic change noted on x-rays.  No acute bony fracture.  Narrative & Impression  CLINICAL DATA:  Pain   EXAM: DG HIP (WITH OR WITHOUT PELVIS) 4+V LEFT   COMPARISON:  CT abdomen pelvis 05/28/2022   FINDINGS: No acute fracture or dislocation. Moderate left and mild right hip osteoarthritis. Moderate colonic stool load.  IMPRESSION: 1. No acute fracture or dislocation. 2. Moderate left and mild right hip osteoarthritis.     Electronically Signed   By: Norman Gatlin M.D.   On: 11/13/2023 03:52    Past Medical/Family/Surgical/Social History: Medications & Allergies reviewed per EMR, new medications updated. Patient Active Problem List   Diagnosis Date Noted  . Diverticulitis large intestine 05/25/2022  . Abnormal CT scan, gallbladder 05/25/2022  .  Diffuse intraductal papillomatosis 09/21/2019  . Nausea with vomiting 01/29/2019  . Total bilirubin, elevated 01/27/2019  . UTI symptoms 01/27/2019  . Acute diverticulitis 01/26/2019  . Papilloma of right breast 09/12/2018  . Malignant neoplasm of upper-outer quadrant of left breast in female, estrogen receptor positive (HCC) 09/22/2017  . Ductal carcinoma in situ (DCIS) of left breast 09/22/2017  . Pain in the chest 11/12/2014  . Essential hypertension 11/12/2014   Past Medical History:  Diagnosis Date  . Abnormal liver function test   . Arthritis    knees, hands & hips, back   . Breast cancer (HCC) 2019   Left Breast Cancer  . Cancer (HCC)    DCIS- L breast  . Complication of anesthesia   . Depression    post partum, also related to her in her 79's  . Dyslipidemia   . Esophageal spasm    rare occurence , /w ? reflux, no treatment or med. thus far  . Headache    tension , sporadic   . Hypertension   . Hypertriglyceridemia   . IBS (irritable bowel syndrome)   . PONV (postoperative nausea and vomiting)   . Sleep disturbance    Family History  Problem Relation Age of Onset  . Cancer Mother   . Breast cancer Mother 23  . Cancer Father    Past Surgical History:  Procedure Laterality Date  . BREAST BIOPSY Left 08/2017  . BREAST BIOPSY Right 09/05/2018  . BREAST BIOPSY Left 07/25/2020  . BREAST BIOPSY Right 07/25/2020   x2  . BREAST LUMPECTOMY Left 09/2017  . BREAST LUMPECTOMY WITH RADIOACTIVE SEED LOCALIZATION Left 09/25/2017   Procedure: LEFT BREAST PARTIAL MASTECTOMY WITH RADIOACTIVE SEED LOCALIZATION ERAS PATHWAY;  Surgeon: Vernetta Berg, MD;  Location: [facility] OR;  Service: General;  Laterality: Left;  . BREAST REDUCTION SURGERY    . CARDIOVASCULAR STRESS TEST    . CESAREAN SECTION  1984  . KNEE ARTHROSCOPY Right 2004  . REDUCTION MAMMAPLASTY Bilateral 1997  . ROOT CANAL    . SHOULDER SURGERY Right    2012- arthroscopy  . TONSILLECTOMY     Social History    Occupational History  . Not on file  Tobacco Use  . Smoking status: Never  . Smokeless tobacco: Never  Vaping Use  . Vaping status: Not on file  Substance and Sexual Activity  . Alcohol use: No  . Drug use: No  . Sexual activity: Yes

## 2024-02-25 NOTE — Progress Notes (Signed)
 Patient says that she has had left hip pain that has started in the time since the beginning of the year. She says that her pain is in the back and side of the hip and wraps around the front and into the groin. She denies any numbness or tingling in the leg. She had a hip injection recently that she says did not give her much relief; she does mention some relief in the front of the hip and groin, although overall does feel a bit worse. She is missing activities of daily living due to her pain, and says that anytime she is exercising her pain does get worse. She also has difficulty lifting the leg to get out of the car. She uses ice which gives her temporary relief. She says that she has done some home exercises, although stopped when her pain worsened. She says that she is open to physical therapy, and has done it in the past for her knee pain. She would also like to have further discussion regarding surgery.

## 2024-03-09 ENCOUNTER — Ambulatory Visit: Admitting: Orthopedic Surgery

## 2024-03-09 ENCOUNTER — Encounter: Payer: Self-pay | Admitting: Sports Medicine

## 2024-03-18 ENCOUNTER — Ambulatory Visit (HOSPITAL_BASED_OUTPATIENT_CLINIC_OR_DEPARTMENT_OTHER): Admitting: Orthopaedic Surgery

## 2024-03-18 DIAGNOSIS — M25552 Pain in left hip: Secondary | ICD-10-CM | POA: Diagnosis not present

## 2024-03-18 DIAGNOSIS — M6281 Muscle weakness (generalized): Secondary | ICD-10-CM | POA: Diagnosis not present

## 2024-03-19 ENCOUNTER — Ambulatory Visit (HOSPITAL_BASED_OUTPATIENT_CLINIC_OR_DEPARTMENT_OTHER): Admitting: Orthopaedic Surgery

## 2024-03-19 DIAGNOSIS — M6281 Muscle weakness (generalized): Secondary | ICD-10-CM | POA: Diagnosis not present

## 2024-03-19 DIAGNOSIS — M25552 Pain in left hip: Secondary | ICD-10-CM | POA: Diagnosis not present

## 2024-04-14 DIAGNOSIS — M9903 Segmental and somatic dysfunction of lumbar region: Secondary | ICD-10-CM | POA: Diagnosis not present

## 2024-04-14 DIAGNOSIS — M51361 Other intervertebral disc degeneration, lumbar region with lower extremity pain only: Secondary | ICD-10-CM | POA: Diagnosis not present

## 2024-04-14 DIAGNOSIS — M9904 Segmental and somatic dysfunction of sacral region: Secondary | ICD-10-CM | POA: Diagnosis not present

## 2024-04-14 DIAGNOSIS — M9905 Segmental and somatic dysfunction of pelvic region: Secondary | ICD-10-CM | POA: Diagnosis not present

## 2024-04-15 DIAGNOSIS — M9904 Segmental and somatic dysfunction of sacral region: Secondary | ICD-10-CM | POA: Diagnosis not present

## 2024-04-15 DIAGNOSIS — M9905 Segmental and somatic dysfunction of pelvic region: Secondary | ICD-10-CM | POA: Diagnosis not present

## 2024-04-15 DIAGNOSIS — M51361 Other intervertebral disc degeneration, lumbar region with lower extremity pain only: Secondary | ICD-10-CM | POA: Diagnosis not present

## 2024-04-15 DIAGNOSIS — M9903 Segmental and somatic dysfunction of lumbar region: Secondary | ICD-10-CM | POA: Diagnosis not present

## 2024-04-20 DIAGNOSIS — R5383 Other fatigue: Secondary | ICD-10-CM | POA: Diagnosis not present

## 2024-04-20 DIAGNOSIS — M9904 Segmental and somatic dysfunction of sacral region: Secondary | ICD-10-CM | POA: Diagnosis not present

## 2024-04-20 DIAGNOSIS — M9903 Segmental and somatic dysfunction of lumbar region: Secondary | ICD-10-CM | POA: Diagnosis not present

## 2024-04-20 DIAGNOSIS — R42 Dizziness and giddiness: Secondary | ICD-10-CM | POA: Diagnosis not present

## 2024-04-20 DIAGNOSIS — G479 Sleep disorder, unspecified: Secondary | ICD-10-CM | POA: Diagnosis not present

## 2024-04-20 DIAGNOSIS — M9905 Segmental and somatic dysfunction of pelvic region: Secondary | ICD-10-CM | POA: Diagnosis not present

## 2024-04-20 DIAGNOSIS — R252 Cramp and spasm: Secondary | ICD-10-CM | POA: Diagnosis not present

## 2024-04-20 DIAGNOSIS — M51361 Other intervertebral disc degeneration, lumbar region with lower extremity pain only: Secondary | ICD-10-CM | POA: Diagnosis not present

## 2024-04-20 DIAGNOSIS — R519 Headache, unspecified: Secondary | ICD-10-CM | POA: Diagnosis not present

## 2024-04-21 ENCOUNTER — Other Ambulatory Visit (HOSPITAL_COMMUNITY): Payer: Self-pay | Admitting: Family Medicine

## 2024-04-21 DIAGNOSIS — R519 Headache, unspecified: Secondary | ICD-10-CM

## 2024-04-24 ENCOUNTER — Ambulatory Visit (HOSPITAL_COMMUNITY)
Admission: RE | Admit: 2024-04-24 | Discharge: 2024-04-24 | Disposition: A | Discharge status: Home / Self Care | Source: Ambulatory Visit | Attending: Family Medicine | Admitting: Family Medicine

## 2024-04-24 DIAGNOSIS — R519 Headache, unspecified: Secondary | ICD-10-CM | POA: Diagnosis not present

## 2024-04-24 MED ORDER — GADOBUTROL 1 MMOL/ML IV SOLN
6.0000 mL | Freq: Once | INTRAVENOUS | Status: AC | PRN
  Administered 2024-04-24: 6 mL via INTRAVENOUS

## 2024-04-28 DIAGNOSIS — M9904 Segmental and somatic dysfunction of sacral region: Secondary | ICD-10-CM | POA: Diagnosis not present

## 2024-04-28 DIAGNOSIS — M9903 Segmental and somatic dysfunction of lumbar region: Secondary | ICD-10-CM | POA: Diagnosis not present

## 2024-04-28 DIAGNOSIS — M9905 Segmental and somatic dysfunction of pelvic region: Secondary | ICD-10-CM | POA: Diagnosis not present

## 2024-04-28 DIAGNOSIS — M51361 Other intervertebral disc degeneration, lumbar region with lower extremity pain only: Secondary | ICD-10-CM | POA: Diagnosis not present

## 2024-05-06 DIAGNOSIS — D225 Melanocytic nevi of trunk: Secondary | ICD-10-CM | POA: Diagnosis not present

## 2024-05-06 DIAGNOSIS — Z85828 Personal history of other malignant neoplasm of skin: Secondary | ICD-10-CM | POA: Diagnosis not present

## 2024-05-06 DIAGNOSIS — L570 Actinic keratosis: Secondary | ICD-10-CM | POA: Diagnosis not present

## 2024-05-06 DIAGNOSIS — L720 Epidermal cyst: Secondary | ICD-10-CM | POA: Diagnosis not present

## 2024-05-06 DIAGNOSIS — L821 Other seborrheic keratosis: Secondary | ICD-10-CM | POA: Diagnosis not present

## 2024-05-06 DIAGNOSIS — D2262 Melanocytic nevi of left upper limb, including shoulder: Secondary | ICD-10-CM | POA: Diagnosis not present

## 2024-05-06 DIAGNOSIS — L853 Xerosis cutis: Secondary | ICD-10-CM | POA: Diagnosis not present

## 2024-05-06 DIAGNOSIS — D2261 Melanocytic nevi of right upper limb, including shoulder: Secondary | ICD-10-CM | POA: Diagnosis not present

## 2024-05-06 DIAGNOSIS — L905 Scar conditions and fibrosis of skin: Secondary | ICD-10-CM | POA: Diagnosis not present

## 2024-05-07 ENCOUNTER — Encounter (HOSPITAL_BASED_OUTPATIENT_CLINIC_OR_DEPARTMENT_OTHER): Payer: Self-pay | Admitting: Orthopaedic Surgery

## 2024-05-08 ENCOUNTER — Ambulatory Visit (HOSPITAL_BASED_OUTPATIENT_CLINIC_OR_DEPARTMENT_OTHER): Admitting: Orthopaedic Surgery

## 2024-05-08 DIAGNOSIS — M25552 Pain in left hip: Secondary | ICD-10-CM

## 2024-05-08 DIAGNOSIS — G8929 Other chronic pain: Secondary | ICD-10-CM

## 2024-05-08 NOTE — Progress Notes (Signed)
 Chief Complaint: Right knee pain, left hip pain    History of Present Illness:   05/08/2024: Presents today for further discussion of the left hip  [name] is a 73 y.o. female presents today with worsening and longstanding right knee pain.  She has deviously had a right knee ACL reconstruction with arthroscopy in the 90s.  Endorses increasing pain about the medial aspect as well as patellofemoral joint.  Not had any previous injections into this knee.  She has very active and enjoys doing tai chi.  She has been experiencing pain in the left groin as well.  She has historically performed and participated in yoga although this has been limited as result of her pain.  She is currently retired    PMH/PSH/Family History/Social History/Meds/Allergies:    Past Medical History:  Diagnosis Date   Abnormal liver function test    Arthritis    knees, hands & hips, back    Breast cancer (HCC) 2019   Left Breast Cancer   Cancer (HCC)    DCIS- L breast   Complication of anesthesia    Depression    post partum, also related to her in her 20's   Dyslipidemia    Esophageal spasm    rare occurence , /w ? reflux, no treatment or med. thus far   Headache    tension , sporadic    Hypertension    Hypertriglyceridemia    IBS (irritable bowel syndrome)    PONV (postoperative nausea and vomiting)    Sleep disturbance    Past Surgical History:  Procedure Laterality Date   BREAST BIOPSY Left 08/2017   BREAST BIOPSY Right 09/05/2018   BREAST BIOPSY Left 07/25/2020   BREAST BIOPSY Right 07/25/2020   x2   BREAST LUMPECTOMY Left 09/2017   BREAST LUMPECTOMY WITH RADIOACTIVE SEED LOCALIZATION Left 09/25/2017   Procedure: LEFT BREAST PARTIAL MASTECTOMY WITH RADIOACTIVE SEED LOCALIZATION ERAS PATHWAY;  Surgeon: Vernetta Berg, MD;  Location: [facility] OR;  Service: General;  Laterality: Left;   BREAST REDUCTION SURGERY     CARDIOVASCULAR STRESS TEST     CESAREAN SECTION  1984   KNEE  ARTHROSCOPY Right 2004   REDUCTION MAMMAPLASTY Bilateral 1997   ROOT CANAL     SHOULDER SURGERY Right    2012- arthroscopy   TONSILLECTOMY     Social History   Socioeconomic History   Marital status: Married    Spouse name: Not on file   Number of children: Not on file   Years of education: Not on file   Highest education level: Not on file  Occupational History   Not on file  Tobacco Use   Smoking status: Never   Smokeless tobacco: Never  Vaping Use   Vaping status: Not on file  Substance and Sexual Activity   Alcohol use: No   Drug use: No   Sexual activity: Yes  Other Topics Concern   Not on file  Social History Narrative   Not on file   Social Drivers of Health   Financial Resource Strain: Not on file  Food Insecurity: No Food Insecurity (05/25/2022)   Hunger Vital Sign    Worried About Running Out of Food in the Last Year: Never true    Ran Out of Food in the Last Year: Never true  Transportation Needs: No Transportation Needs (05/25/2022)   PRAPARE - Administrator, Civil Service (Medical): No    Lack of Transportation (Non-Medical): No  Physical Activity:  Not on file  Stress: Not on file  Social Connections: Not on file   Family History  Problem Relation Age of Onset   Cancer Mother    Breast cancer Mother 81   Cancer Father    Allergies  Allergen Reactions   Tetracycline Nausea And Vomiting and Other (See Comments)    Abdominal pain   Candida Albicans Nausea And Vomiting and Other (See Comments)    Abdominal pain   Cephalexin Nausea And Vomiting and Other (See Comments)    Abdominal pain   Yeast Nausea And Vomiting and Other (See Comments)    Abdominal pain   Fenofibrate Other (See Comments)    Unknown reaction   Glycerine [Glycerin] Nausea And Vomiting   Levofloxacin Other (See Comments)    Destroyed her gut bacteria   Tetracyclines & Related Nausea And Vomiting   Morphine  Nausea And Vomiting   Current Outpatient Medications   Medication Sig Dispense Refill   ALEVE  220 MG tablet Take 220-440 mg by mouth 2 (two) times daily as needed (for pain).     amLODipine  (NORVASC ) 2.5 MG tablet Take 2.5 mg by mouth daily after supper.     BISACODYL  5 MG EC tablet Take by mouth.     diclofenac  sodium (VOLTAREN ) 1 % GEL Apply 2 g topically 4 (four) times daily.     EXCEDRIN TENSION HEADACHE 500-65 MG TABS per tablet Take 1 tablet by mouth every 6 (six) hours as needed (for headaches).     Glucosamine-Chondroitin-MSM 500-400-125 MG TABS Take 2 tablets by mouth daily.     irbesartan-hydrochlorothiazide (AVALIDE) 150-12.5 MG tablet Take 1 tablet by mouth daily.     Multiple Vitamins-Minerals (MULTIVITAMIN WOMEN 50+) TABS Take 1 tablet by mouth daily with breakfast.     omeprazole (PRILOSEC) 20 MG capsule Take 20 mg by mouth daily before breakfast.     ondansetron  (ZOFRAN  ODT) 4 MG disintegrating tablet Take 1 tablet (4 mg total) by mouth every 8 (eight) hours as needed for nausea or vomiting. 12 tablet 1   polyethylene glycol (MIRALAX  / GLYCOLAX ) 17 g packet Take 17 g by mouth daily as needed for moderate constipation or mild constipation. 14 each 0   polyethylene glycol-electrolytes (NULYTELY ) 420 g solution Take by mouth as directed.     Probiotic Product (PROBIOTIC DAILY PO) Take 1 capsule by mouth daily.     Simethicone  (PHAZYME) 180 MG CAPS Take 180-360 mg by mouth daily as needed (for gas).     zaleplon (SONATA) 10 MG capsule Take 10 mg by mouth at bedtime as needed for sleep.     Zoster Vaccine Adjuvanted [facility]) injection Inject 0.5 mLs into the muscle once.     No current facility-administered medications for this visit.   No results found.  Review of Systems:   A ROS was performed including pertinent positives and negatives as documented in the HPI.  Physical Exam :   Constitutional: NAD and appears stated age Neurological: Alert and oriented Psych: Appropriate affect and cooperative There were no vitals taken  for this visit.   Comprehensive Musculoskeletal Exam:    Tenderness about the medial patellofemoral joint on the right with crepitus with range of motion from 0 to 120 degrees, negative effusion previous portals are well-healed  Left shoulder with tenderness about the femoral acetabular joint with pain with internal rotation of 30 degrees.   Imaging:   Xray (4 views right knee, 3 views left hip): Moderate left hip osteoarthritis, moderate right knee medial  patellofemoral osteoarthritis     I personally reviewed and interpreted the radiographs.   Assessment and Plan:   73 y.o. female with known moderate left hip osteoarthritis.  She has now had 2 injections which have not given her persistent relief.  She is still very active and would enjoy to stay that way.  Given this we will plan to make a referral to Dr. Vernetta for discussion of left total hip arthroplasty  -For referral to Dr. Vernetta for left total hip arthroplasty      I personally saw and evaluated the patient, and participated in the management and treatment plan.  Elspeth Parker, MD Attending Physician, Orthopedic Surgery  This document was dictated using Dragon voice recognition software. A reasonable attempt at proof reading has been made to minimize errors.

## 2024-05-27 DIAGNOSIS — I10 Essential (primary) hypertension: Secondary | ICD-10-CM | POA: Diagnosis not present

## 2024-05-27 DIAGNOSIS — E781 Pure hyperglyceridemia: Secondary | ICD-10-CM | POA: Diagnosis not present

## 2024-05-27 DIAGNOSIS — R7301 Impaired fasting glucose: Secondary | ICD-10-CM | POA: Diagnosis not present

## 2024-05-27 DIAGNOSIS — M85852 Other specified disorders of bone density and structure, left thigh: Secondary | ICD-10-CM | POA: Diagnosis not present

## 2024-06-01 DIAGNOSIS — E781 Pure hyperglyceridemia: Secondary | ICD-10-CM | POA: Diagnosis not present

## 2024-06-01 DIAGNOSIS — R5383 Other fatigue: Secondary | ICD-10-CM | POA: Diagnosis not present

## 2024-06-01 DIAGNOSIS — I10 Essential (primary) hypertension: Secondary | ICD-10-CM | POA: Diagnosis not present

## 2024-06-01 DIAGNOSIS — G479 Sleep disorder, unspecified: Secondary | ICD-10-CM | POA: Diagnosis not present

## 2024-06-01 DIAGNOSIS — K219 Gastro-esophageal reflux disease without esophagitis: Secondary | ICD-10-CM | POA: Diagnosis not present

## 2024-06-01 DIAGNOSIS — M85851 Other specified disorders of bone density and structure, right thigh: Secondary | ICD-10-CM | POA: Diagnosis not present

## 2024-06-01 DIAGNOSIS — R7303 Prediabetes: Secondary | ICD-10-CM | POA: Diagnosis not present

## 2024-06-03 DIAGNOSIS — G4700 Insomnia, unspecified: Secondary | ICD-10-CM | POA: Diagnosis not present

## 2024-06-03 DIAGNOSIS — G4733 Obstructive sleep apnea (adult) (pediatric): Secondary | ICD-10-CM | POA: Diagnosis not present

## 2024-06-03 DIAGNOSIS — G4730 Sleep apnea, unspecified: Secondary | ICD-10-CM | POA: Diagnosis not present

## 2024-06-15 ENCOUNTER — Encounter: Payer: Self-pay | Admitting: Radiology

## 2024-06-23 ENCOUNTER — Encounter (HOSPITAL_BASED_OUTPATIENT_CLINIC_OR_DEPARTMENT_OTHER): Payer: Self-pay | Admitting: Orthopaedic Surgery

## 2024-06-25 ENCOUNTER — Encounter: Payer: Self-pay | Admitting: Sports Medicine

## 2024-06-25 ENCOUNTER — Other Ambulatory Visit: Payer: Self-pay

## 2024-06-25 ENCOUNTER — Ambulatory Visit: Admitting: Sports Medicine

## 2024-06-25 DIAGNOSIS — G8929 Other chronic pain: Secondary | ICD-10-CM | POA: Diagnosis not present

## 2024-06-25 DIAGNOSIS — M25552 Pain in left hip: Secondary | ICD-10-CM | POA: Diagnosis not present

## 2024-06-25 DIAGNOSIS — M1612 Unilateral primary osteoarthritis, left hip: Secondary | ICD-10-CM

## 2024-06-25 MED ORDER — METHYLPREDNISOLONE ACETATE 40 MG/ML IJ SUSP
80.0000 mg | INTRAMUSCULAR | Status: AC | PRN
  Administered 2024-06-25: 80 mg via INTRA_ARTICULAR

## 2024-06-25 MED ORDER — LIDOCAINE HCL 1 % IJ SOLN
4.0000 mL | INTRAMUSCULAR | Status: AC | PRN
  Administered 2024-06-25: 4 mL

## 2024-06-25 NOTE — Progress Notes (Signed)
 [name] - 73 y.o. female MRN [id number]  Date of birth: [birth year]  Office Visit Note: Visit Date: 06/25/2024 PCP: Aisha Harvey, MD Referred by: Aisha Harvey, MD  Subjective: Chief Complaint  Patient presents with   Left Hip - Pain   HPI: [name] is a pleasant 73 y.o. female who presents today for follow-up of acute on chronic left hip pain.  She does have at least moderate hip osteoarthritis.  Back 3 months ago we did perform intra-articular hip injection which gave her great relief although here recently her pain has been worse and bothering him with her activities of daily living dependence but last week this certainly flared up her pain.  She has limited/helpful because of the pain at times.  She does have an appointment with Dr. Vernetta upcoming at the beginning of December to discuss THA.  She is using Tylenol  as needed and has taken tramadol  but this made her feel nauseous.  Pertinent ROS were reviewed with the patient and found to be negative unless otherwise specified above in HPI.   Assessment & Plan: Visit Diagnoses:  1. Unilateral primary osteoarthritis, left hip   2. Chronic left hip pain    Plan: Impression is chronic left hip pain with at least moderate osteoarthritis with current exacerbation of her pain. She received very good relief from intra-articular injection 3 months ago.  She does have an appointment with Dr. Vernetta in December to discuss possible THA, she is looking for some relief prior to this.  Through shared decision making, we did proceed with ultrasound-guided intra-articular injection, patient tolerated well.  Advised on postinjection protocol.  She may use ice/heat and/or Tylenol  as needed for her pain.  I do think she would benefit from continuing PT/hip stabilization exercises in the interim.  She will follow-up with Dr. Vernetta in December for discussion on THA, I am happy to see her as needed.  Follow-up: Return  for F/u with Dr. Vernetta for L-hip OA.   Meds & Orders: No orders of the defined types were placed in this encounter.   Orders Placed This Encounter  Procedures   US  Guided Needle Placement - No Linked Charges     Procedures: Large Joint Inj: L hip joint on 06/25/2024 10:34 AM Indications: pain Details: 22 G 3.5 in needle, ultrasound-guided anterior approach Medications: 4 mL lidocaine  1 %; 80 mg methylPREDNISolone  acetate 40 MG/ML Outcome: tolerated well, no immediate complications  Procedure: US -guided intra-articular hip injection, Left After discussion on risks/benefits/indications and informed verbal consent was obtained, a timeout was performed. Patient was lying supine on exam table. The hip was cleaned with betadine and alcohol swabs. Then utilizing ultrasound guidance, the patient's femoral head and neck junction was identified and subsequently injected with 4:2 lidocaine :depomedrol via an in-plane approach with ultrasound visualization of the injectate administered into the hip joint. Patient tolerated procedure well without immediate complications.  Procedure, treatment alternatives, risks and benefits explained, specific risks discussed. Consent was given by the patient. Immediately prior to procedure a time out was called to verify the correct patient, procedure, equipment, support staff and site/side marked as required. Patient was prepped and draped in the usual sterile fashion.          Clinical History: No specialty comments available.  She reports that she has never smoked. She has never used smokeless tobacco. No results for input(s): HGBA1C, LABURIC in the last 8760 hours.  Objective:    Physical Exam  Gen: Well-appearing, in no  acute distress; non-toxic CV: Well-perfused. Warm.  Resp: Breathing unlabored on room air; no wheezing. Psych: Fluid speech in conversation; appropriate affect; normal thought process  Ortho Exam - Left hip: No redness swelling  or effusion.  There is pain with internal rotation as well as positive FADIR test.  There is pain with activation of hip flexion.  Imaging:  Narrative & Impression  CLINICAL DATA:  Pain   EXAM: DG HIP (WITH OR WITHOUT PELVIS) 4+V LEFT   COMPARISON:  CT abdomen pelvis 05/28/2022   FINDINGS: No acute fracture or dislocation. Moderate left and mild right hip osteoarthritis. Moderate colonic stool load.   IMPRESSION: 1. No acute fracture or dislocation. 2. Moderate left and mild right hip osteoarthritis.     Electronically Signed   By: Norman Gatlin M.D.   On: 11/13/2023 03:52    Past Medical/Family/Surgical/Social History: Medications & Allergies reviewed per EMR, new medications updated. Patient Active Problem List   Diagnosis Date Noted   Diverticulitis large intestine 05/25/2022   Abnormal CT scan, gallbladder 05/25/2022   Diffuse intraductal papillomatosis 09/21/2019   Nausea with vomiting 01/29/2019   Total bilirubin, elevated 01/27/2019   UTI symptoms 01/27/2019   Acute diverticulitis 01/26/2019   Papilloma of right breast 09/12/2018   Malignant neoplasm of upper-outer quadrant of left breast in female, estrogen receptor positive (HCC) 09/22/2017   Ductal carcinoma in situ (DCIS) of left breast 09/22/2017   Pain in the chest 11/12/2014   Essential hypertension 11/12/2014   Past Medical History:  Diagnosis Date   Abnormal liver function test    Arthritis    knees, hands & hips, back    Breast cancer (HCC) 2019   Left Breast Cancer   Cancer (HCC)    DCIS- L breast   Complication of anesthesia    Depression    post partum, also related to her in her 20's   Dyslipidemia    Esophageal spasm    rare occurence , /w ? reflux, no treatment or med. thus far   Headache    tension , sporadic    Hypertension    Hypertriglyceridemia    IBS (irritable bowel syndrome)    PONV (postoperative nausea and vomiting)    Sleep disturbance    Family History  Problem  Relation Age of Onset   Cancer Mother    Breast cancer Mother 47   Cancer Father    Past Surgical History:  Procedure Laterality Date   BREAST BIOPSY Left 08/2017   BREAST BIOPSY Right 09/05/2018   BREAST BIOPSY Left 07/25/2020   BREAST BIOPSY Right 07/25/2020   x2   BREAST LUMPECTOMY Left 09/2017   BREAST LUMPECTOMY WITH RADIOACTIVE SEED LOCALIZATION Left 09/25/2017   Procedure: LEFT BREAST PARTIAL MASTECTOMY WITH RADIOACTIVE SEED LOCALIZATION ERAS PATHWAY;  Surgeon: Vernetta Berg, MD;  Location: [facility] OR;  Service: General;  Laterality: Left;   BREAST REDUCTION SURGERY     CARDIOVASCULAR STRESS TEST     CESAREAN SECTION  1984   KNEE ARTHROSCOPY Right 2004   REDUCTION MAMMAPLASTY Bilateral 1997   ROOT CANAL     SHOULDER SURGERY Right    2012- arthroscopy   TONSILLECTOMY     Social History   Occupational History   Not on file  Tobacco Use   Smoking status: Never   Smokeless tobacco: Never  Vaping Use   Vaping status: Not on file  Substance and Sexual Activity   Alcohol use: No   Drug use: No   Sexual activity:  Yes

## 2024-07-02 ENCOUNTER — Ambulatory Visit: Admitting: Sports Medicine

## 2024-07-15 ENCOUNTER — Ambulatory Visit: Admitting: Orthopaedic Surgery

## 2024-07-15 ENCOUNTER — Encounter: Payer: Self-pay | Admitting: Orthopaedic Surgery

## 2024-07-15 DIAGNOSIS — Z96642 Presence of left artificial hip joint: Secondary | ICD-10-CM

## 2024-07-15 NOTE — Progress Notes (Signed)
 HPI: [name] comes in today for left hip pain.  She has known left hip arthritis.  Films from 11/13/2023 were reviewed and shows near bone-on-bone weightbearing portion of the left hip and acetabular pincer type impingement.  No acute fracture or AVN.  Right hip overall well preserved.  She states that the left hip has been bothering her for a little over a year now.  She has had a total of 3 intra-articular injections in the left hip.  She states that she has to modify her activities and would like to get back to playing tennis and walking more for exercise.  Occasionally has to use a cane due to the pain.  Ranks pain to be 8 out of 10 at worst.  She has tried multiple over-the-counter supplements including turmeric for the pain.  She has also tried Tylenol  and tramadol  for the pain.  Occasionally takes an NSAID also for the hip pain.  States that all of these medications are becoming less effective over time.  Injections are short-lived now where they were more effective at first.  Last injection was 06/25/2024 which only lasted a few days.  Review of systems: Negative for fevers chills ongoing infections.  Physical exam: General Well-developed well-nourished pleasant female in no acute distress. Psych: Alert and oriented x 3 Respirations: Nonlabored Bilateral hips: Good range of motion of both hips.  Pain with internal rotation of the left hip only. Lower extremities: Bilateral calves are supple and nontender.  Dorsiflexion plantarflexion bilateral ankles intact.  Impression: Left hip osteoarthritis/degenerative joint disease  Plan: Discussed with patient that she has failed conservative treatments which included activity modification, medications, assistive devices and intra-articular injections.  Recommend left total hip arthroplasty.  Questions encouraged and answered at length.  Postoperative perioperative protocol discussed with patient.  Risk benefits of surgery discussed risk include but is  not limited to nerve vessel injury, wound healing problems, leg length discrepancy, blood loss, infection and DVT/PE.  Handout on hip replacement was given.  Questions were encouraged and answered by Dr. Vernetta and myself.  Will proceed with surgery in the near future.

## 2024-07-22 ENCOUNTER — Encounter: Payer: Self-pay | Admitting: Orthopaedic Surgery

## 2024-07-22 ENCOUNTER — Other Ambulatory Visit: Payer: Self-pay | Admitting: Orthopaedic Surgery

## 2024-07-22 MED ORDER — ACETAMINOPHEN-CODEINE 300-30 MG PO TABS: 1.0000 | ORAL_TABLET | Freq: Two times a day (BID) | ORAL | 0 refills | Status: DC | PRN

## 2024-07-22 MED ORDER — MELOXICAM 15 MG PO TABS: 15.0000 mg | ORAL_TABLET | Freq: Every day | ORAL | 1 refills | Status: AC

## 2024-08-03 ENCOUNTER — Emergency Department (HOSPITAL_BASED_OUTPATIENT_CLINIC_OR_DEPARTMENT_OTHER)

## 2024-08-03 ENCOUNTER — Other Ambulatory Visit: Payer: Self-pay

## 2024-08-03 DIAGNOSIS — R110 Nausea: Secondary | ICD-10-CM | POA: Insufficient documentation

## 2024-08-03 DIAGNOSIS — R140 Abdominal distension (gaseous): Secondary | ICD-10-CM | POA: Diagnosis not present

## 2024-08-03 DIAGNOSIS — R1084 Generalized abdominal pain: Secondary | ICD-10-CM | POA: Insufficient documentation

## 2024-08-03 DIAGNOSIS — I10 Essential (primary) hypertension: Secondary | ICD-10-CM | POA: Insufficient documentation

## 2024-08-03 DIAGNOSIS — R109 Unspecified abdominal pain: Secondary | ICD-10-CM | POA: Diagnosis present

## 2024-08-03 DIAGNOSIS — Z79899 Other long term (current) drug therapy: Secondary | ICD-10-CM | POA: Diagnosis not present

## 2024-08-03 LAB — CBC
HCT: 40.9 % (ref 36.0–46.0)
Hemoglobin: 14.2 g/dL (ref 12.0–15.0)
MCH: 31.1 pg (ref 26.0–34.0)
MCHC: 34.7 g/dL (ref 30.0–36.0)
MCV: 89.7 fL (ref 80.0–100.0)
Platelets: 348 K/uL (ref 150–400)
RBC: 4.56 MIL/uL (ref 3.87–5.11)
RDW: 12.6 % (ref 11.5–15.5)
WBC: 9.2 K/uL (ref 4.0–10.5)
nRBC: 0 % (ref 0.0–0.2)

## 2024-08-03 LAB — COMPREHENSIVE METABOLIC PANEL WITH GFR
ALT: 59 U/L — ABNORMAL HIGH (ref 0–44)
AST: 51 U/L — ABNORMAL HIGH (ref 15–41)
Albumin: 4.4 g/dL (ref 3.5–5.0)
Alkaline Phosphatase: 129 U/L — ABNORMAL HIGH (ref 38–126)
Anion gap: 14 (ref 5–15)
BUN: 12 mg/dL (ref 8–23)
CO2: 25 mmol/L (ref 22–32)
Calcium: 9.7 mg/dL (ref 8.9–10.3)
Chloride: 100 mmol/L (ref 98–111)
Creatinine, Ser: 0.76 mg/dL (ref 0.44–1.00)
GFR, Estimated: >60 mL/min
Glucose, Bld: 149 mg/dL — ABNORMAL HIGH (ref 70–99)
Potassium: 3.5 mmol/L (ref 3.5–5.1)
Sodium: 139 mmol/L (ref 135–145)
Total Bilirubin: 0.4 mg/dL (ref 0.0–1.2)
Total Protein: 7.4 g/dL (ref 6.5–8.1)

## 2024-08-03 LAB — URINALYSIS, ROUTINE W REFLEX MICROSCOPIC
Bacteria, UA: NONE SEEN
Bilirubin Urine: NEGATIVE
Glucose, UA: NEGATIVE mg/dL
Hgb urine dipstick: NEGATIVE
Ketones, ur: NEGATIVE mg/dL
Nitrite: NEGATIVE
Protein, ur: NEGATIVE mg/dL
Specific Gravity, Urine: 1.035 — ABNORMAL HIGH (ref 1.005–1.030)
pH: 7 (ref 5.0–8.0)

## 2024-08-03 LAB — LIPASE, BLOOD: Lipase: 29 U/L (ref 11–51)

## 2024-08-03 MED ORDER — OXYCODONE-ACETAMINOPHEN 5-325 MG PO TABS
1.0000 | ORAL_TABLET | ORAL | Status: DC | PRN
  Administered 2024-08-03: 1 via ORAL
  Filled 2024-08-03 (×2): qty 1

## 2024-08-03 MED ORDER — IOHEXOL 300 MG/ML  SOLN
100.0000 mL | Freq: Once | INTRAMUSCULAR | Status: AC | PRN
  Administered 2024-08-03: 85 mL via INTRAVENOUS

## 2024-08-03 NOTE — ED Triage Notes (Signed)
 Suspects diverticulitis. Cramping, abd pain, chills, no appetite. Denies diarrhea. Last BM yesterday.

## 2024-08-04 ENCOUNTER — Emergency Department (HOSPITAL_BASED_OUTPATIENT_CLINIC_OR_DEPARTMENT_OTHER)
Admission: EM | Admit: 2024-08-04 | Discharge: 2024-08-04 | Disposition: A | Discharge status: Home / Self Care | Attending: Emergency Medicine | Admitting: Emergency Medicine

## 2024-08-04 DIAGNOSIS — K5792 Diverticulitis of intestine, part unspecified, without perforation or abscess without bleeding: Secondary | ICD-10-CM

## 2024-08-04 MED ORDER — METRONIDAZOLE 500 MG PO TABS
500.0000 mg | ORAL_TABLET | Freq: Once | ORAL | Status: AC
  Administered 2024-08-04: 500 mg via ORAL
  Filled 2024-08-04: qty 1

## 2024-08-04 MED ORDER — CIPROFLOXACIN HCL 500 MG PO TABS
500.0000 mg | ORAL_TABLET | Freq: Once | ORAL | Status: AC
  Administered 2024-08-04: 500 mg via ORAL
  Filled 2024-08-04: qty 1

## 2024-08-04 MED ORDER — HYDROCODONE-ACETAMINOPHEN 5-325 MG PO TABS: 1.0000 | ORAL_TABLET | Freq: Four times a day (QID) | ORAL | 0 refills | Status: DC | PRN

## 2024-08-04 MED ORDER — CIPROFLOXACIN HCL 500 MG PO TABS: 500.0000 mg | ORAL_TABLET | Freq: Two times a day (BID) | ORAL | 0 refills | Status: DC

## 2024-08-04 MED ORDER — METRONIDAZOLE 500 MG PO TABS: 500.0000 mg | ORAL_TABLET | Freq: Three times a day (TID) | ORAL | 0 refills | Status: DC

## 2024-08-04 NOTE — Discharge Instructions (Signed)
 Begin taking Cipro  and Flagyl  as prescribed.  Begin taking hydrocodone  as prescribed as needed for pain.  Take a stool softener such as Colace and a glass of water when taking the hydrocodone  as this may further constipate you.  Return to the ER if you develop worsening pain, high fevers, bloody stools, or for other new and concerning symptoms.

## 2024-08-04 NOTE — ED Provider Notes (Signed)
 " [facility] EMERGENCY DEPARTMENT AT [facility] Provider Note   CSN: [id number] Arrival date & time: 08/03/24  2030     Patient presents with: Abdominal Pain   [name] is a 73 y.o. female.   Patient is a 73 year old female with past medical history of hypertension, diverticulitis.  Patient presenting today with complaints of abdominal pain.  Symptoms began yesterday and worsened this evening.  She feels bloated and nauseated, but has not vomited.  She does report some constipation.  No fevers or chills.  No aggravating or alleviating factors.       Prior to Admission medications  Medication Sig Start Date End Date Taking? Authorizing Provider  acetaminophen -codeine  (TYLENOL  #3) 300-30 MG tablet Take 1-2 tablets by mouth 2 (two) times daily as needed for moderate pain (pain score 4-6). 07/22/24   Vernetta Lonni GRADE, MD  ALEVE  220 MG tablet Take 220-440 mg by mouth 2 (two) times daily as needed (for pain).    [provider]  amLODipine  (NORVASC ) 2.5 MG tablet Take 2.5 mg by mouth daily after supper. 04/13/21   [provider]  diclofenac  sodium (VOLTAREN ) 1 % GEL Apply 2 g topically 4 (four) times daily.    [provider]  EXCEDRIN TENSION HEADACHE 500-65 MG TABS per tablet Take 1 tablet by mouth every 6 (six) hours as needed (for headaches).    [provider]  Glucosamine-Chondroitin-MSM 500-400-125 MG TABS Take 2 tablets by mouth daily.    [provider]  irbesartan-hydrochlorothiazide (AVALIDE) 150-12.5 MG tablet Take 1 tablet by mouth daily. 12/21/18   [provider]  meloxicam  (MOBIC ) 15 MG tablet Take 1 tablet (15 mg total) by mouth daily. 07/22/24   Vernetta Lonni GRADE, MD  Multiple Vitamins-Minerals (MULTIVITAMIN WOMEN 50+) TABS Take 1 tablet by mouth daily with breakfast.    [provider]  polyethylene glycol (MIRALAX  / GLYCOLAX ) 17 g packet Take 17 g by mouth daily as needed for  moderate constipation or mild constipation. 05/31/22   Ghimire, Kuber, MD  Probiotic Product (PROBIOTIC DAILY PO) Take 1 capsule by mouth daily.    [provider]  Simethicone  (PHAZYME) 180 MG CAPS Take 180-360 mg by mouth daily as needed (for gas).    [provider]  zaleplon (SONATA) 10 MG capsule Take 10 mg by mouth at bedtime as needed for sleep.    [provider]  Zoster Vaccine Adjuvanted [facility]) injection Inject 0.5 mLs into the muscle once.    [provider]    Allergies: Tetracycline, Candida albicans, Cephalexin, Yeast (saccharomyces cerevisiae), Fenofibrate, Glycerine [glycerin], Levofloxacin, Tetracyclines & related, and Morphine     Review of Systems  All other systems reviewed and are negative.   Updated Vital Signs BP (!) 145/79   Pulse 73   Temp 99 F (37.2 C)   Resp 18   SpO2 96%   Physical Exam Vitals and nursing note reviewed.  Constitutional:      General: She is not in acute distress.    Appearance: She is well-developed. She is not diaphoretic.  HENT:     Head: Normocephalic and atraumatic.  Cardiovascular:     Rate and Rhythm: Normal rate and regular rhythm.     Heart sounds: No murmur heard.    No friction rub. No gallop.  Pulmonary:     Effort: Pulmonary effort is normal. No respiratory distress.     Breath sounds: Normal breath sounds. No wheezing.  Abdominal:     General:  Bowel sounds are normal. There is no distension.     Palpations: Abdomen is soft.     Tenderness: There is generalized abdominal tenderness. There is no right CVA tenderness, left CVA tenderness, guarding or rebound.  Musculoskeletal:        General: Normal range of motion.     Cervical back: Normal range of motion and neck supple.  Skin:    General: Skin is warm and dry.  Neurological:     General: No focal deficit present.     Mental Status: She is alert and oriented to person, place, and time.     (all labs ordered are listed,  but only abnormal results are displayed) Labs Reviewed  COMPREHENSIVE METABOLIC PANEL WITH GFR - Abnormal; Notable for the following components:      Result Value   Glucose, Bld 149 (*)    AST 51 (*)    ALT 59 (*)    Alkaline Phosphatase 129 (*)    All other components within normal limits  URINALYSIS, ROUTINE W REFLEX MICROSCOPIC - Abnormal; Notable for the following components:   Color, Urine COLORLESS (*)    Specific Gravity, Urine 1.035 (*)    Leukocytes,Ua TRACE (*)    All other components within normal limits  LIPASE, BLOOD  CBC    EKG: None  Radiology: CT ABDOMEN PELVIS W CONTRAST Result Date: 08/03/2024 EXAM: CT ABDOMEN AND PELVIS WITH CONTRAST 08/03/2024 10:56:39 PM TECHNIQUE: CT of the abdomen and pelvis was performed with the administration of 85 mL of iohexol  (OMNIPAQUE ) 300 MG/ML solution. Multiplanar reformatted images are provided for review. Automated exposure control, iterative reconstruction, and/or weight-based adjustment of the mA/kV was utilized to reduce the radiation dose to as low as reasonably achievable. COMPARISON: 05/28/2022 CLINICAL HISTORY: Abdominal pain, acute, nonlocalized. FINDINGS: LOWER CHEST: Small hiatal hernia. LIVER: Hepatic steatosis. GALLBLADDER AND BILE DUCTS: Gallbladder is unremarkable. No biliary ductal dilatation. SPLEEN: No acute abnormality. PANCREAS: No acute abnormality. ADRENAL GLANDS: No acute abnormality. KIDNEYS, URETERS AND BLADDER: Nonobstructing bilateral nephrolithiasis. No hydronephrosis. No perinephric or periureteral stranding. Urinary bladder is unremarkable. GI AND BOWEL: Extensive descending and sigmoid colon diverticulosis. Trace stranding about the distal descending colon suggesting mild diverticulitis. Large fecal load in the colon compatible with constipation. Stomach demonstrates no acute abnormality. There is no bowel obstruction. PERITONEUM AND RETROPERITONEUM: No ascites. No free air. VASCULATURE: Aorta is normal in  caliber. LYMPH NODES: No lymphadenopathy. REPRODUCTIVE ORGANS: No acute abnormality. BONES AND SOFT TISSUES: Advanced arthritis left hip. No acute osseous abnormality. No focal soft tissue abnormality. IMPRESSION: 1. Mild descending colon diverticulitis. 2. Large fecal load in the colon compatible with constipation. Electronically signed by: Norman Gatlin MD 08/03/2024 11:06 PM EST RP Workstation: [id number]     Procedures   Medications Ordered in the ED  oxyCODONE -acetaminophen  (PERCOCET/ROXICET) 5-325 MG per tablet 1 tablet (1 tablet Oral Given 08/03/24 2243)  ciprofloxacin  (CIPRO ) tablet 500 mg (has no administration in time range)  metroNIDAZOLE  (FLAGYL ) tablet 500 mg (has no administration in time range)  iohexol  (OMNIPAQUE ) 300 MG/ML solution 100 mL (85 mLs Intravenous Contrast Given 08/03/24 2250)                                    Medical Decision Making Amount and/or Complexity of Data Reviewed Labs: ordered. Radiology: ordered.  Risk Prescription drug management.   Patient presenting here with complaints of abdominal discomfort.  She has history of  diverticulitis and this feels similar.  She arrives here with stable vital signs and is afebrile.  She does have some left lower quadrant tenderness, but no peritoneal signs.  Laboratory studies obtained including CBC, CMP, and lipase, all of which are unremarkable.  Urinalysis is clear.  CT scan of the abdomen and pelvis shows mild descending colon diverticulitis.  She also has a large fecal load compatible with constipation.  Patient will be discharged with Cipro  and Flagyl .  I will also prescribe medication for pain, but have advised her to make sure she takes the pain medication with water and a stool softener to prevent further constipation.  To return as needed for any problems.     Final diagnoses:  None    ED Discharge Orders     None          Geroldine Berg, MD 08/04/24 [phone]  "

## 2024-08-18 NOTE — Progress Notes (Signed)
 Sent message, via epic in basket, requesting orders in epic from Careers adviser.

## 2024-08-19 NOTE — Patient Instructions (Signed)
 SURGICAL WAITING ROOM VISITATION Patients having surgery or a procedure may have no more than 2 support people in the waiting area - these visitors may rotate in the visitor waiting room.   Due to an increase in RSV and influenza rates and associated hospitalizations, children ages 25 and under may not visit patients in [place] hospitals. If the patient needs to stay at the hospital during part of their recovery, the visitor guidelines for inpatient rooms apply.  PRE-OP VISITATION  Pre-op nurse will coordinate an appropriate time for 1 support person to accompany the patient in pre-op.  This support person may not rotate.  This visitor will be contacted when the time is appropriate for the visitor to come back in the pre-op area.  Please refer to the [facility] website for the visitor guidelines for Inpatients (after your surgery is over and you are in a regular room).  You are not required to quarantine at this time prior to your surgery. However, you must do this: Hand Hygiene often Do NOT share personal items Notify your provider if you are in close contact with someone who has COVID or you develop fever 100.4 or greater, new onset of sneezing, cough, sore throat, shortness of breath or body aches.  If you test positive for Covid or have been in contact with anyone that has tested positive in the last 10 days please notify you surgeon.    Your procedure is scheduled on:  08/28/24  Report to [facility] Main Entrance: [place] entrance where the [employer] is available.   Report to admitting at: 6:00 AM  Call this number if you have any questions or problems the morning of surgery [phone]  FOLLOW ANY ADDITIONAL PRE OP INSTRUCTIONS YOU RECEIVED FROM YOUR SURGEON'S OFFICE!!!  Do not eat food after Midnight the night prior to your surgery/procedure.  After Midnight you may have the following liquids until: 5:30 AM DAY OF SURGERY  Clear Liquid Diet Water Black  Coffee (sugar ok, NO MILK/CREAM OR CREAMERS)  Tea (sugar ok, NO MILK/CREAM OR CREAMERS) regular and decaf                             Plain Jell-O  with no fruit (NO RED)                                           Fruit ices (not with fruit pulp, NO RED)                                     Popsicles (NO RED)                                                                  Juice: NO CITRUS JUICES: only apple, WHITE grape, WHITE cranberry Sports drinks like Gatorade or Powerade (NO RED)   The day of surgery:  Drink ONE (1) Pre-Surgery Clear Ensure at : 5:30 AM the morning of surgery. Drink in one sitting. Do not sip.  This drink was given  to you during your hospital pre-op appointment visit. Nothing else to drink after completing the Pre-Surgery Clear Ensure or G2 : No candy, chewing gum or throat lozenges.    Oral Hygiene is also important to reduce your risk of infection.        Remember - BRUSH YOUR TEETH THE MORNING OF SURGERY WITH YOUR REGULAR TOOTHPASTE  Do NOT smoke after Midnight the night before surgery.  STOP TAKING all Vitamins, Herbs and supplements 1 week before your surgery.   Take ONLY these medicines the morning of surgery with A SIP OF WATER: amlodipine .  If You have been diagnosed with Sleep Apnea - Bring CPAP mask and tubing day of surgery. We will provide you with a CPAP machine on the day of your surgery.                   You may not have any metal on your body including hair pins, jewelry, and body piercing  Do not wear make-up, lotions, powders, perfumes / cologne, or deodorant  Do not wear nail polish including gel and S&S, artificial / acrylic nails, or any other type of covering on natural nails including finger and toenails. If you have artificial nails, gel coating, etc., that needs to be removed by a nail salon, Please have this removed prior to surgery. Not doing so may mean that your surgery could be cancelled or delayed if the Surgeon or anesthesia staff  feels like they are unable to monitor you safely.   Do not shave 48 hours prior to surgery to avoid nicks in your skin which may contribute to postoperative infections.   Contacts, Hearing Aids, dentures or bridgework may not be worn into surgery. DENTURES WILL BE REMOVED PRIOR TO SURGERY PLEASE DO NOT APPLY Poly grip OR ADHESIVES!!!  You may bring a small overnight bag with you on the day of surgery, only pack items that are not valuable. [facility] IS NOT RESPONSIBLE   FOR VALUABLES THAT ARE LOST OR STOLEN.   Patients discharged on the day of surgery will not be allowed to drive home.  Someone NEEDS to stay with you for the first 24 hours after anesthesia.  Do not bring your home medications to the hospital. The Pharmacy will dispense medications listed on your medication list to you during your admission in the Hospital.  Special Instructions: Bring a copy of your healthcare power of attorney and living will documents the day of surgery, if you wish to have them scanned into your [facility] Records- EPIC  Please read over the following fact sheets you were given: IF YOU HAVE QUESTIONS ABOUT YOUR PRE-OP INSTRUCTIONS, PLEASE CALL [phone]  PATIENT SIGNATURE_________________________________  NURSE SIGNATURE__________________________________  ________________________________________________________________________  Pre-operative 4 CHG Bath Instructions  DYNA-Hex 4 Chlorhexidine  Gluconate 4% Solution Antiseptic 4 fl. oz   You can play a key role in reducing the risk of infection after surgery. Your skin needs to be as free of germs as possible. You can reduce the number of germs on your skin by washing with CHG (chlorhexidine  gluconate) soap before surgery. CHG is an antiseptic soap that kills germs and continues to kill germs even after washing.   DO NOT use if you have an allergy to chlorhexidine /CHG or antibacterial soaps. If your skin becomes reddened or irritated, stop  using the CHG and notify one of our RNs at   Please shower with the CHG soap starting 4 days before surgery using the following schedule:  Please keep in mind the following:  DO NOT shave, including legs and underarms, starting the day of your first shower.   You may shave your face at any point before/day of surgery.  Place clean sheets on your bed the day you start using CHG soap. Use a clean washcloth (not used since being washed) for each shower. DO NOT sleep with pets once you start using the CHG.  CHG Shower Instructions:  If you choose to wash your hair and private area, wash first with your normal shampoo/soap.  After you use shampoo/soap, rinse your hair and body thoroughly to remove shampoo/soap residue.  Turn the water OFF and apply about 3 tablespoons (45 ml) of CHG soap to a CLEAN washcloth.  Apply CHG soap ONLY FROM YOUR NECK DOWN TO YOUR TOES (washing for 3-5 minutes)  DO NOT use CHG soap on face, private areas, open wounds, or sores.  Pay special attention to the area where your surgery is being performed.  If you are having back surgery, having someone wash your back for you may be helpful. Wait 2 minutes after CHG soap is applied, then you may rinse off the CHG soap.  Pat dry with a clean towel  Put on clean clothes/pajamas   If you choose to wear lotion, please use ONLY the CHG-compatible lotions on the back of this paper.     Additional instructions for the day of surgery: DO NOT APPLY any lotions, deodorants, cologne, or perfumes.   Put on clean/comfortable clothes.  Brush your teeth.  Ask your nurse before applying any prescription medications to the skin.   CHG Compatible Lotions   Aveeno Moisturizing lotion  Cetaphil Moisturizing Cream  Cetaphil Moisturizing Lotion  Clairol Herbal Essence Moisturizing Lotion, Dry Skin  Clairol Herbal Essence Moisturizing Lotion, Extra Dry Skin  Clairol Herbal Essence Moisturizing Lotion, Normal Skin  Curel Age  Defying Therapeutic Moisturizing Lotion with Alpha Hydroxy  Curel Extreme Care Body Lotion  Curel Soothing Hands Moisturizing Hand Lotion  Curel Therapeutic Moisturizing Cream, Fragrance-Free  Curel Therapeutic Moisturizing Lotion, Fragrance-Free  Curel Therapeutic Moisturizing Lotion, Original Formula  Eucerin Daily Replenishing Lotion  Eucerin Dry Skin Therapy Plus Alpha Hydroxy Crme  Eucerin Dry Skin Therapy Plus Alpha Hydroxy Lotion  Eucerin Original Crme  Eucerin Original Lotion  Eucerin Plus Crme Eucerin Plus Lotion  Eucerin TriLipid Replenishing Lotion  Keri Anti-Bacterial Hand Lotion  Keri Deep Conditioning Original Lotion Dry Skin Formula Softly Scented  Keri Deep Conditioning Original Lotion, Fragrance Free Sensitive Skin Formula  Keri Lotion Fast Absorbing Fragrance Free Sensitive Skin Formula  Keri Lotion Fast Absorbing Softly Scented Dry Skin Formula  Keri Original Lotion  Keri Skin Renewal Lotion Keri Silky Smooth Lotion  Keri Silky Smooth Sensitive Skin Lotion  Nivea Body Creamy Conditioning Oil  Nivea Body Extra Enriched Lotion  Nivea Body Original Lotion  Nivea Body Sheer Moisturizing Lotion Nivea Crme  Nivea Skin Firming Lotion  NutraDerm 30 Skin Lotion  NutraDerm Skin Lotion  NutraDerm Therapeutic Skin Cream  NutraDerm Therapeutic Skin Lotion  ProShield Protective Hand Cream  Provon moisturizing lotion  Incentive Spirometer  An incentive spirometer is a tool that can help keep your lungs clear and active. This tool measures how well you are filling your lungs with each breath. Taking long deep breaths may help reverse or decrease the chance of developing breathing (pulmonary) problems (especially infection) following: A long period of time when you are unable to move or be active. BEFORE THE PROCEDURE  If  the spirometer includes an indicator to show your best effort, your nurse or respiratory therapist will set it to a desired goal. If possible, sit up  straight or lean slightly forward. Try not to slouch. Hold the incentive spirometer in an upright position. INSTRUCTIONS FOR USE  Sit on the edge of your bed if possible, or sit up as far as you can in bed or on a chair. Hold the incentive spirometer in an upright position. Breathe out normally. Place the mouthpiece in your mouth and seal your lips tightly around it. Breathe in slowly and as deeply as possible, raising the piston or the ball toward the top of the column. Hold your breath for 3-5 seconds or for as long as possible. Allow the piston or ball to fall to the bottom of the column. Remove the mouthpiece from your mouth and breathe out normally. Rest for a few seconds and repeat Steps 1 through 7 at least 10 times every 1-2 hours when you are awake. Take your time and take a few normal breaths between deep breaths. The spirometer may include an indicator to show your best effort. Use the indicator as a goal to work toward during each repetition. After each set of 10 deep breaths, practice coughing to be sure your lungs are clear. If you have an incision (the cut made at the time of surgery), support your incision when coughing by placing a pillow or rolled up towels firmly against it. Once you are able to get out of bed, walk around indoors and cough well. You may stop using the incentive spirometer when instructed by your caregiver.  RISKS AND COMPLICATIONS Take your time so you do not get dizzy or light-headed. If you are in pain, you may need to take or ask for pain medication before doing incentive spirometry. It is harder to take a deep breath if you are having pain. AFTER USE Rest and breathe slowly and easily. It can be helpful to keep track of a log of your progress. Your caregiver can provide you with a simple table to help with this. If you are using the spirometer at home, follow these instructions: SEEK MEDICAL CARE IF:  You are having difficultly using the spirometer. You  have trouble using the spirometer as often as instructed. Your pain medication is not giving enough relief while using the spirometer. You develop fever of 100.5 F (38.1 C) or higher. SEEK IMMEDIATE MEDICAL CARE IF:  You cough up bloody sputum that had not been present before. You develop fever of 102 F (38.9 C) or greater. You develop worsening pain at or near the incision site. MAKE SURE YOU:  Understand these instructions. Will watch your condition. Will get help right away if you are not doing well or get worse. Document Released: 12/10/2006 Document Revised: 10/22/2011 Document Reviewed: 02/10/2007 [facility] Patient Information 2014 [place], [place].   ________________________________________________________________________

## 2024-08-20 ENCOUNTER — Encounter (HOSPITAL_COMMUNITY): Payer: Self-pay

## 2024-08-20 ENCOUNTER — Encounter (HOSPITAL_COMMUNITY)
Admission: RE | Admit: 2024-08-20 | Discharge: 2024-08-20 | Disposition: A | Discharge status: Home / Self Care | Source: Ambulatory Visit | Attending: Orthopaedic Surgery | Admitting: Orthopaedic Surgery

## 2024-08-20 ENCOUNTER — Other Ambulatory Visit: Payer: Self-pay

## 2024-08-20 VITALS — BP 148/75 | HR 75 | Temp 97.6°F | Ht 62.5 in | Wt 132.0 lb

## 2024-08-20 DIAGNOSIS — M1612 Unilateral primary osteoarthritis, left hip: Secondary | ICD-10-CM | POA: Diagnosis not present

## 2024-08-20 DIAGNOSIS — Z01810 Encounter for preprocedural cardiovascular examination: Secondary | ICD-10-CM | POA: Diagnosis present

## 2024-08-20 DIAGNOSIS — Z01812 Encounter for preprocedural laboratory examination: Secondary | ICD-10-CM | POA: Diagnosis present

## 2024-08-20 DIAGNOSIS — I10 Essential (primary) hypertension: Secondary | ICD-10-CM | POA: Insufficient documentation

## 2024-08-20 DIAGNOSIS — Z01818 Encounter for other preprocedural examination: Secondary | ICD-10-CM | POA: Insufficient documentation

## 2024-08-20 HISTORY — DX: Prediabetes: R73.03

## 2024-08-20 HISTORY — DX: Diverticulitis of intestine, part unspecified, without perforation or abscess without bleeding: K57.92

## 2024-08-20 LAB — CBC
HCT: 40.3 % (ref 36.0–46.0)
Hemoglobin: 13.7 g/dL (ref 12.0–15.0)
MCH: 31.5 pg (ref 26.0–34.0)
MCHC: 34 g/dL (ref 30.0–36.0)
MCV: 92.6 fL (ref 80.0–100.0)
Platelets: 357 K/uL (ref 150–400)
RBC: 4.35 MIL/uL (ref 3.87–5.11)
RDW: 12.7 % (ref 11.5–15.5)
WBC: 7.2 K/uL (ref 4.0–10.5)
nRBC: 0 % (ref 0.0–0.2)

## 2024-08-20 LAB — BASIC METABOLIC PANEL WITH GFR
Anion gap: 11 (ref 5–15)
BUN: 18 mg/dL (ref 8–23)
CO2: 26 mmol/L (ref 22–32)
Calcium: 9.7 mg/dL (ref 8.9–10.3)
Chloride: 101 mmol/L (ref 98–111)
Creatinine, Ser: 0.82 mg/dL (ref 0.44–1.00)
GFR, Estimated: >60 mL/min
Glucose, Bld: 119 mg/dL — ABNORMAL HIGH (ref 70–99)
Potassium: 3.6 mmol/L (ref 3.5–5.1)
Sodium: 139 mmol/L (ref 135–145)

## 2024-08-20 LAB — SURGICAL PCR SCREEN
MRSA, PCR: NEGATIVE
Staphylococcus aureus: NEGATIVE

## 2024-08-20 NOTE — Progress Notes (Addendum)
 For Anesthesia: PCP - Aisha Harvey, MD  Cardiologist - N/A  Bowel Prep reminder:  Chest x-ray -  EKG - 08/20/24 Stress Test -  ECHO -  Cardiac Cath -  Pacemaker/ICD device last checked: Pacemaker orders received: Device Rep notified:  Spinal Cord Stimulator:N/A  Sleep Study - Yes CPAP - N/A  Fasting Blood Sugar - N/A Checks Blood Sugar _____ times a day Date and result of last Hgb A1c-  Last dose of GLP1 agonist- N/A GLP1 instructions: Hold 7 days prior to schedule (Hold 24 hours-daily)   Last dose of SGLT-2 inhibitors- N/A SGLT-2 instructions: Hold 72 hours prior to surgery  Blood Thinner Instructions:N/A Last Dose: Time last taken:  Aspirin  Instructions:N/A Last Dose: Time last taken:  Activity level: Can go up a flight of stairs and activities of daily living without stopping and without chest pain and/or shortness of breath   Able to exercise without chest pain and/or shortness of breath  Anesthesia review: Hx: HTN,ED visit: Diverticulitis(08/04/24)  Patient denies shortness of breath, fever, cough and chest pain at PAT appointment   Patient verbalized understanding of instructions that were reviewed over the telephone.

## 2024-08-27 DIAGNOSIS — M1612 Unilateral primary osteoarthritis, left hip: Secondary | ICD-10-CM | POA: Insufficient documentation

## 2024-08-27 MED ORDER — GENTAMICIN SULFATE 40 MG/ML IJ SOLN
5.0000 mg/kg | INTRAVENOUS | Status: AC
  Administered 2024-08-28: 300 mg via INTRAVENOUS
  Filled 2024-08-27: qty 7.5

## 2024-08-27 NOTE — Care Plan (Signed)
 Ortho Bundle Case Management Note  Patient Details  Name: [name] MRN: [id number] Date of Birth: [birth year]  Finally spoke with patient after multiple calls back and forth to discuss her upcoming Left total hip arthroplasty with Dr. Vernetta at [facility]. She is agreeable to case management. She lives with her spouse, and plans to discharge home with his assistance. She does have a walker, but unsure if it is one that fits her height. May need a Jr. walker prior to d/c. Anticipate some Home therapy will be needed after a short hospital stay. Patient lives at [facility] and therapy at her home is provided. Reviewed post op care instructions and questions answered. Will continue to follow for needs.                   DME Arranged:   (RW- husband has one, but may not be correct size. May need Jr. walker) DME Agency:     HH Arranged:  PT HH Agency:   [facility] will provide therapy)  Additional Comments: Please contact me with any questions of if this plan should need to change.  Tylene Ned, RN, BSN, [employer]  [phone] 08/27/2024, 3:15 PM

## 2024-08-27 NOTE — H&P (Signed)
 TOTAL HIP ADMISSION H&P  Patient is admitted for left total hip arthroplasty.  Subjective:  Chief Complaint: left hip pain  HPI: [name], 74 y.o. female, has a history of pain and functional disability in the left hip(s) due to arthritis and patient has failed non-surgical conservative treatments for greater than 12 weeks to include NSAID's and/or analgesics, corticosteriod injections, use of assistive devices, and activity modification.  Onset of symptoms was gradual starting just over a year ago with gradually worsening course since that time.The patient noted no past surgery on the left hip(s).  Patient currently rates pain in the left hip at 10 out of 10 with activity. Patient has night pain, worsening of pain with activity and weight bearing, pain that interfers with activities of daily living, and pain with passive range of motion. Patient has evidence of subchondral sclerosis, periarticular osteophytes, and joint space narrowing by imaging studies. This condition presents safety issues increasing the risk of falls.  There is no current active infection.  Patient Active Problem List   Diagnosis Date Noted   Unilateral primary osteoarthritis, left hip 08/27/2024   Diverticulitis large intestine 05/25/2022   Abnormal CT scan, gallbladder 05/25/2022   Diffuse intraductal papillomatosis 09/21/2019   Nausea with vomiting 01/29/2019   Total bilirubin, elevated 01/27/2019   UTI symptoms 01/27/2019   Acute diverticulitis 01/26/2019   Papilloma of right breast 09/12/2018   Malignant neoplasm of upper-outer quadrant of left breast in female, estrogen receptor positive (HCC) 09/22/2017   Ductal carcinoma in situ (DCIS) of left breast 09/22/2017   Pain in the chest 11/12/2014   Essential hypertension 11/12/2014   Past Medical History:  Diagnosis Date   Abnormal liver function test    Arthritis    knees, hands & hips, back    Breast cancer (HCC) 2019   Left Breast Cancer    Cancer (HCC)    DCIS- L breast   Complication of anesthesia    Depression    post partum, also related to her in her 20's   Diverticulitis    Dyslipidemia    Esophageal spasm    rare occurence , /w ? reflux, no treatment or med. thus far   Headache    tension , sporadic    Hypertension    Hypertriglyceridemia    IBS (irritable bowel syndrome)    PONV (postoperative nausea and vomiting)    Pre-diabetes    Sleep disturbance     Past Surgical History:  Procedure Laterality Date   BREAST BIOPSY Left 08/2017   BREAST BIOPSY Right 09/05/2018   BREAST BIOPSY Left 07/25/2020   BREAST BIOPSY Right 07/25/2020   x2   BREAST LUMPECTOMY Left 09/2017   BREAST LUMPECTOMY WITH RADIOACTIVE SEED LOCALIZATION Left 09/25/2017   Procedure: LEFT BREAST PARTIAL MASTECTOMY WITH RADIOACTIVE SEED LOCALIZATION ERAS PATHWAY;  Surgeon: Vernetta Berg, MD;  Location: [facility] OR;  Service: General;  Laterality: Left;   BREAST REDUCTION SURGERY     CARDIOVASCULAR STRESS TEST     CESAREAN SECTION  1984   KNEE ARTHROSCOPY Right 2004   REDUCTION MAMMAPLASTY Bilateral 1997   ROOT CANAL     SHOULDER SURGERY Right    2012- arthroscopy   TONSILLECTOMY      Current Facility-Administered Medications  Medication Dose Route Frequency Provider Last Rate Last Admin   [START ON 08/28/2024] gentamicin  (GARAMYCIN ) 300 mg in dextrose  5 % 100 mL IVPB  5 mg/kg Intravenous On Call to OR Vernetta Lonni GRADE, MD  Current Outpatient Medications  Medication Sig Dispense Refill Last Dose/Taking   acetaminophen -codeine  (TYLENOL  #3) 300-30 MG tablet Take 1-2 tablets by mouth 2 (two) times daily as needed for moderate pain (pain score 4-6). (Patient not taking: Reported on 08/20/2024) 30 tablet 0 Not Taking   ALEVE  220 MG tablet Take 220-440 mg by mouth 2 (two) times daily as needed (for pain).   Taking As Needed   amLODipine  (NORVASC ) 2.5 MG tablet Take 2.5 mg by mouth daily after supper.   Taking   diclofenac  sodium  (VOLTAREN ) 1 % GEL Apply 2 g topically 4 (four) times daily.   Taking   EXCEDRIN TENSION HEADACHE 500-65 MG TABS per tablet Take 1 tablet by mouth every 6 (six) hours as needed (for headaches).   Taking As Needed   Glucosamine-Chondroitin-MSM 500-400-125 MG TABS Take 2 tablets by mouth daily.   Taking   irbesartan -hydrochlorothiazide  (AVALIDE) 150-12.5 MG tablet Take 1 tablet by mouth daily.   Taking   meloxicam  (MOBIC ) 15 MG tablet Take 1 tablet (15 mg total) by mouth daily. 30 tablet 1 Taking   Multiple Vitamins-Minerals (MULTIVITAMIN WOMEN 50+) TABS Take 1 tablet by mouth daily with breakfast.   Taking   polyethylene glycol (MIRALAX  / GLYCOLAX ) 17 g packet Take 17 g by mouth daily as needed for moderate constipation or mild constipation. 14 each 0 Taking As Needed   Probiotic Product (PROBIOTIC DAILY PO) Take 1 capsule by mouth daily.   Taking   Simethicone  (PHAZYME) 180 MG CAPS Take 180-360 mg by mouth daily as needed (for gas).   Taking As Needed   zaleplon (SONATA) 10 MG capsule Take 10 mg by mouth at bedtime as needed for sleep.   Taking As Needed   ciprofloxacin  (CIPRO ) 500 MG tablet Take 1 tablet (500 mg total) by mouth 2 (two) times daily. One po bid x 7 days (Patient not taking: Reported on 08/20/2024) 14 tablet 0 Not Taking   HYDROcodone -acetaminophen  (NORCO/VICODIN) 5-325 MG tablet Take 1-2 tablets by mouth every 6 (six) hours as needed. (Patient not taking: Reported on 08/20/2024) 15 tablet 0 Not Taking   metroNIDAZOLE  (FLAGYL ) 500 MG tablet Take 1 tablet (500 mg total) by mouth 3 (three) times daily. (Patient not taking: Reported on 08/20/2024) 21 tablet 0 Not Taking   Zoster Vaccine Adjuvanted [facility]) injection Inject 0.5 mLs into the muscle once.      Allergies[1]  Social History   Tobacco Use   Smoking status: Never   Smokeless tobacco: Never  Substance Use Topics   Alcohol use: No    Family History  Problem Relation Age of Onset   Cancer Mother    Breast cancer Mother 41    Cancer Father      Review of Systems  Objective:  Physical Exam Vitals reviewed.  Constitutional:      Appearance: Normal appearance. She is normal weight.  HENT:     Head: Normocephalic and atraumatic.  Eyes:     Extraocular Movements: Extraocular movements intact.     Pupils: Pupils are equal, round, and reactive to light.  Cardiovascular:     Rate and Rhythm: Normal rate and regular rhythm.  Pulmonary:     Effort: Pulmonary effort is normal.     Breath sounds: Normal breath sounds.  Abdominal:     Palpations: Abdomen is soft.  Musculoskeletal:     Cervical back: Normal range of motion and neck supple.     Left hip: Tenderness and bony tenderness present. Decreased range  of motion. Decreased strength.  Neurological:     Mental Status: She is alert and oriented to person, place, and time.  Psychiatric:        Behavior: Behavior normal.     Vital signs in last 24 hours:    Labs:   Estimated body mass index is 23.76 kg/m as calculated from the following:   Height as of 08/20/24: 5' 2.5 (1.588 m).   Weight as of 08/20/24: 59.9 kg.   Imaging Review Plain radiographs demonstrate severe degenerative joint disease of the left hip(s). The bone quality appears to be good for age and reported activity level.      Assessment/Plan:  End stage arthritis, left hip(s)  The patient history, physical examination, clinical judgement of the provider and imaging studies are consistent with end stage degenerative joint disease of the left hip(s) and total hip arthroplasty is deemed medically necessary. The treatment options including medical management, injection therapy, arthroscopy and arthroplasty were discussed at length. The risks and benefits of total hip arthroplasty were presented and reviewed. The risks due to aseptic loosening, infection, stiffness, dislocation/subluxation,  thromboembolic complications and other imponderables were discussed.  The patient acknowledged the  explanation, agreed to proceed with the plan and consent was signed. Patient is being admitted for inpatient treatment for surgery, pain control, PT, OT, prophylactic antibiotics, VTE prophylaxis, progressive ambulation and ADL's and discharge planning.The patient is planning to be discharged home with home health services       [1]  Allergies Allergen Reactions   Tetracycline Nausea And Vomiting and Other (See Comments)    Abdominal pain   Candida Albicans Nausea And Vomiting and Other (See Comments)    Abdominal pain   Cephalexin Nausea And Vomiting and Other (See Comments)    Abdominal pain   Yeast (Saccharomyces Cerevisiae) Nausea And Vomiting and Other (See Comments)    Abdominal pain   Ciprofloxacin  Nausea And Vomiting   Fenofibrate Other (See Comments)    Unknown reaction   Glycerine [Glycerin] Nausea And Vomiting   Levofloxacin Other (See Comments)    Destroyed her gut bacteria   Metronidazole  Nausea And Vomiting   Tetracyclines & Related Nausea And Vomiting   Morphine  Nausea And Vomiting

## 2024-08-27 NOTE — Anesthesia Preprocedure Evaluation (Addendum)
 "                                  Anesthesia Evaluation  Patient identified by MRN, date of birth, ID band Patient awake    Reviewed: Allergy & Precautions, NPO status , Patient's Chart, lab work & pertinent test results  History of Anesthesia Complications (+) PONV and history of anesthetic complications  Airway Mallampati: II  TM Distance: >3 FB Neck ROM: Full    Dental  (+) Dental Advisory Given   Pulmonary neg pulmonary ROS   breath sounds clear to auscultation       Cardiovascular hypertension, Pt. on medications  Rhythm:Regular Rate:Normal     Neuro/Psych negative neurological ROS     GI/Hepatic negative GI ROS, Neg liver ROS,,,  Endo/Other  negative endocrine ROS    Renal/GU negative Renal ROSLab Results      Component                Value               Date                      NA                       139                 08/20/2024                CL                       101                 08/20/2024                K                        3.6                 08/20/2024                CO2                      26                  08/20/2024                BUN                      18                  08/20/2024                CREATININE               0.82                08/20/2024                GFRNONAA                 >60                 08/20/2024                CALCIUM  9.7                 08/20/2024                PHOS                     2.8                 05/29/2022                ALBUMIN                  4.4                 08/03/2024                GLUCOSE                  119 (H)             08/20/2024                Musculoskeletal  (+) Arthritis ,    Abdominal   Peds  Hematology negative hematology ROS (+) Lab Results      Component                Value               Date                      WBC                      7.2                 08/20/2024                HGB                      13.7                 08/20/2024                HCT                      40.3                08/20/2024                MCV                      92.6                08/20/2024                PLT                      357                 08/20/2024              Anesthesia Other Findings   Reproductive/Obstetrics                              Anesthesia Physical Anesthesia Plan  ASA: 2  Anesthesia Plan: Spinal   Post-op Pain Management: Tylenol  PO (pre-op)*   Induction:   PONV Risk Score and Plan: 3  and Propofol  infusion, Dexamethasone  and Ondansetron   Airway Management Planned: Natural Airway and Simple Face Mask  Additional Equipment: None  Intra-op Plan:   Post-operative Plan:   Informed Consent: I have reviewed the patients History and Physical, chart, labs and discussed the procedure including the risks, benefits and alternatives for the proposed anesthesia with the patient or authorized representative who has indicated his/her understanding and acceptance.       Plan Discussed with: CRNA  Anesthesia Plan Comments:          Anesthesia Quick Evaluation  "

## 2024-08-28 ENCOUNTER — Observation Stay (HOSPITAL_COMMUNITY)

## 2024-08-28 ENCOUNTER — Ambulatory Visit (HOSPITAL_COMMUNITY)

## 2024-08-28 ENCOUNTER — Other Ambulatory Visit: Payer: Self-pay

## 2024-08-28 ENCOUNTER — Ambulatory Visit (HOSPITAL_COMMUNITY): Admitting: Anesthesiology

## 2024-08-28 ENCOUNTER — Encounter (HOSPITAL_COMMUNITY)
Admission: RE | Disposition: A | Discharge status: Home Health | Payer: Self-pay | Source: Ambulatory Visit | Attending: Orthopaedic Surgery

## 2024-08-28 ENCOUNTER — Encounter (HOSPITAL_COMMUNITY): Payer: Self-pay | Admitting: Orthopaedic Surgery

## 2024-08-28 ENCOUNTER — Inpatient Hospital Stay (HOSPITAL_COMMUNITY)
Admission: RE | Admit: 2024-08-28 | Discharge: 2024-09-02 | DRG: 470 | Disposition: A | Discharge status: Skilled Nursing Facility | Source: Ambulatory Visit | Attending: Orthopaedic Surgery | Admitting: Orthopaedic Surgery

## 2024-08-28 ENCOUNTER — Encounter (HOSPITAL_COMMUNITY): Admitting: Physician Assistant

## 2024-08-28 DIAGNOSIS — Z885 Allergy status to narcotic agent status: Secondary | ICD-10-CM

## 2024-08-28 DIAGNOSIS — M1612 Unilateral primary osteoarthritis, left hip: Secondary | ICD-10-CM | POA: Diagnosis not present

## 2024-08-28 DIAGNOSIS — R7303 Prediabetes: Secondary | ICD-10-CM | POA: Diagnosis present

## 2024-08-28 DIAGNOSIS — Z9181 History of falling: Secondary | ICD-10-CM

## 2024-08-28 DIAGNOSIS — G479 Sleep disorder, unspecified: Secondary | ICD-10-CM | POA: Diagnosis present

## 2024-08-28 DIAGNOSIS — Z803 Family history of malignant neoplasm of breast: Secondary | ICD-10-CM

## 2024-08-28 DIAGNOSIS — I10 Essential (primary) hypertension: Secondary | ICD-10-CM | POA: Diagnosis present

## 2024-08-28 DIAGNOSIS — Z888 Allergy status to other drugs, medicaments and biological substances status: Secondary | ICD-10-CM

## 2024-08-28 DIAGNOSIS — Z96642 Presence of left artificial hip joint: Secondary | ICD-10-CM

## 2024-08-28 DIAGNOSIS — K589 Irritable bowel syndrome without diarrhea: Secondary | ICD-10-CM | POA: Diagnosis present

## 2024-08-28 DIAGNOSIS — Z853 Personal history of malignant neoplasm of breast: Secondary | ICD-10-CM

## 2024-08-28 DIAGNOSIS — E781 Pure hyperglyceridemia: Secondary | ICD-10-CM | POA: Diagnosis present

## 2024-08-28 DIAGNOSIS — Z791 Long term (current) use of non-steroidal anti-inflammatories (NSAID): Secondary | ICD-10-CM

## 2024-08-28 HISTORY — PX: TOTAL HIP ARTHROPLASTY: SHX124

## 2024-08-28 LAB — TYPE AND SCREEN
ABO/RH(D): O NEG
Antibody Screen: NEGATIVE

## 2024-08-28 LAB — ABO/RH: ABO/RH(D): O NEG

## 2024-08-28 MED ORDER — FENTANYL CITRATE (PF) 100 MCG/2ML IJ SOLN
INTRAMUSCULAR | Status: AC
  Filled 2024-08-28: qty 2

## 2024-08-28 MED ORDER — POLYETHYLENE GLYCOL 3350 17 G PO PACK
17.0000 g | PACK | Freq: Every day | ORAL | Status: DC | PRN
  Administered 2024-09-02: 17 g via ORAL
  Filled 2024-08-28: qty 1

## 2024-08-28 MED ORDER — DOCUSATE SODIUM 100 MG PO CAPS
100.0000 mg | ORAL_CAPSULE | Freq: Two times a day (BID) | ORAL | Status: DC
  Administered 2024-08-28 – 2024-09-02 (×10): 100 mg via ORAL
  Filled 2024-08-28 (×10): qty 1

## 2024-08-28 MED ORDER — 0.9 % SODIUM CHLORIDE (POUR BTL) OPTIME
TOPICAL | Status: DC | PRN
  Administered 2024-08-28: 1000 mL

## 2024-08-28 MED ORDER — LIDOCAINE HCL (CARDIAC) PF 100 MG/5ML IV SOSY
PREFILLED_SYRINGE | INTRAVENOUS | Status: DC | PRN
  Administered 2024-08-28: 20 mg via INTRAVENOUS

## 2024-08-28 MED ORDER — PANTOPRAZOLE SODIUM 40 MG PO TBEC
40.0000 mg | DELAYED_RELEASE_TABLET | Freq: Every day | ORAL | Status: DC
  Administered 2024-08-28 – 2024-09-02 (×6): 40 mg via ORAL
  Filled 2024-08-28 (×6): qty 1

## 2024-08-28 MED ORDER — OXYCODONE HCL 5 MG PO TABS: 5.0000 mg | ORAL_TABLET | Freq: Once | ORAL | Status: DC | PRN

## 2024-08-28 MED ORDER — ONDANSETRON HCL 4 MG/2ML IJ SOLN
INTRAMUSCULAR | Status: AC
  Filled 2024-08-28: qty 2

## 2024-08-28 MED ORDER — VANCOMYCIN HCL IN DEXTROSE 1-5 GM/200ML-% IV SOLN
1000.0000 mg | INTRAVENOUS | Status: AC
  Administered 2024-08-28: 1000 mg via INTRAVENOUS
  Filled 2024-08-28: qty 200

## 2024-08-28 MED ORDER — SODIUM CHLORIDE 0.9 % IV SOLN: INTRAVENOUS | Status: DC

## 2024-08-28 MED ORDER — ACETAMINOPHEN 325 MG PO TABS
325.0000 mg | ORAL_TABLET | Freq: Four times a day (QID) | ORAL | Status: DC | PRN
  Administered 2024-08-30 – 2024-09-02 (×2): 650 mg via ORAL
  Filled 2024-08-28 (×2): qty 2

## 2024-08-28 MED ORDER — DIPHENHYDRAMINE HCL 12.5 MG/5ML PO ELIX: 12.5000 mg | ORAL_SOLUTION | ORAL | Status: DC | PRN

## 2024-08-28 MED ORDER — ZOLPIDEM TARTRATE 5 MG PO TABS: 5.0000 mg | ORAL_TABLET | Freq: Every evening | ORAL | Status: DC | PRN

## 2024-08-28 MED ORDER — DEXAMETHASONE SOD PHOSPHATE PF 10 MG/ML IJ SOLN
INTRAMUSCULAR | Status: AC
  Filled 2024-08-28: qty 1

## 2024-08-28 MED ORDER — ALUM & MAG HYDROXIDE-SIMETH 200-200-20 MG/5ML PO SUSP
30.0000 mL | ORAL | Status: DC | PRN
  Administered 2024-08-28 – 2024-09-01 (×2): 30 mL via ORAL
  Filled 2024-08-28 (×2): qty 30

## 2024-08-28 MED ORDER — MIDAZOLAM HCL (PF) 2 MG/2ML IJ SOLN
INTRAMUSCULAR | Status: DC | PRN
  Administered 2024-08-28: 2 mg via INTRAVENOUS

## 2024-08-28 MED ORDER — TRANEXAMIC ACID-NACL 1000-0.7 MG/100ML-% IV SOLN
1000.0000 mg | INTRAVENOUS | Status: AC
  Administered 2024-08-28: 1000 mg via INTRAVENOUS
  Filled 2024-08-28: qty 100

## 2024-08-28 MED ORDER — LIDOCAINE HCL (PF) 2 % IJ SOLN
INTRAMUSCULAR | Status: AC
  Filled 2024-08-28: qty 5

## 2024-08-28 MED ORDER — AMLODIPINE BESYLATE 2.5 MG PO TABS
2.5000 mg | ORAL_TABLET | Freq: Every day | ORAL | Status: DC
  Administered 2024-08-29 – 2024-09-01 (×4): 2.5 mg via ORAL
  Filled 2024-08-28 (×5): qty 1

## 2024-08-28 MED ORDER — FENTANYL CITRATE (PF) 100 MCG/2ML IJ SOLN
INTRAMUSCULAR | Status: DC | PRN
  Administered 2024-08-28: 100 ug via INTRAVENOUS

## 2024-08-28 MED ORDER — ONDANSETRON HCL 4 MG PO TABS: 4.0000 mg | ORAL_TABLET | Freq: Four times a day (QID) | ORAL | Status: DC | PRN

## 2024-08-28 MED ORDER — METHOCARBAMOL 500 MG PO TABS
500.0000 mg | ORAL_TABLET | Freq: Four times a day (QID) | ORAL | Status: DC | PRN
  Administered 2024-08-28 – 2024-09-02 (×9): 500 mg via ORAL
  Filled 2024-08-28 (×10): qty 1

## 2024-08-28 MED ORDER — ACETAMINOPHEN 500 MG PO TABS
1000.0000 mg | ORAL_TABLET | Freq: Once | ORAL | Status: AC
  Administered 2024-08-28: 1000 mg via ORAL
  Filled 2024-08-28: qty 2

## 2024-08-28 MED ORDER — PROPOFOL 10 MG/ML IV BOLUS
INTRAVENOUS | Status: DC | PRN
  Administered 2024-08-28 (×2): 20 mg via INTRAVENOUS

## 2024-08-28 MED ORDER — CHLORHEXIDINE GLUCONATE 0.12 % MT SOLN
15.0000 mL | Freq: Once | OROMUCOSAL | Status: AC
  Administered 2024-08-28: 15 mL via OROMUCOSAL

## 2024-08-28 MED ORDER — STERILE WATER FOR IRRIGATION IR SOLN
Status: DC | PRN
  Administered 2024-08-28: 2000 mL

## 2024-08-28 MED ORDER — MENTHOL 3 MG MT LOZG: 1.0000 | LOZENGE | OROMUCOSAL | Status: DC | PRN

## 2024-08-28 MED ORDER — VANCOMYCIN HCL IN DEXTROSE 1-5 GM/200ML-% IV SOLN
1000.0000 mg | Freq: Two times a day (BID) | INTRAVENOUS | Status: AC
  Administered 2024-08-28: 1000 mg via INTRAVENOUS
  Filled 2024-08-28: qty 200

## 2024-08-28 MED ORDER — ASPIRIN 81 MG PO CHEW
81.0000 mg | CHEWABLE_TABLET | Freq: Two times a day (BID) | ORAL | Status: DC
  Administered 2024-08-28 – 2024-09-02 (×10): 81 mg via ORAL
  Filled 2024-08-28 (×10): qty 1

## 2024-08-28 MED ORDER — DEXAMETHASONE SOD PHOSPHATE PF 10 MG/ML IJ SOLN
INTRAMUSCULAR | Status: DC | PRN
  Administered 2024-08-28: 5 mg via INTRAVENOUS

## 2024-08-28 MED ORDER — HYDROCODONE-ACETAMINOPHEN 5-325 MG PO TABS
1.0000 | ORAL_TABLET | ORAL | Status: DC | PRN
  Administered 2024-08-28 (×2): 2 via ORAL
  Administered 2024-08-29 – 2024-08-30 (×4): 1 via ORAL
  Administered 2024-08-31: 2 via ORAL
  Administered 2024-08-31: 1 via ORAL
  Administered 2024-09-01: 2 via ORAL
  Administered 2024-09-01: 1 via ORAL
  Administered 2024-09-02: 2 via ORAL
  Filled 2024-08-28 (×4): qty 1
  Filled 2024-08-28 (×4): qty 2
  Filled 2024-08-28 (×2): qty 1
  Filled 2024-08-28: qty 2
  Filled 2024-08-28: qty 1
  Filled 2024-08-28: qty 2

## 2024-08-28 MED ORDER — PROPOFOL 500 MG/50ML IV EMUL
INTRAVENOUS | Status: DC | PRN
  Administered 2024-08-28: 50 ug/kg/min via INTRAVENOUS

## 2024-08-28 MED ORDER — ORAL CARE MOUTH RINSE: 15.0000 mL | OROMUCOSAL | Status: DC | PRN

## 2024-08-28 MED ORDER — BUPIVACAINE IN DEXTROSE 0.75-8.25 % IT SOLN
INTRATHECAL | Status: DC | PRN
  Administered 2024-08-28: 1.6 mL via INTRATHECAL

## 2024-08-28 MED ORDER — SODIUM CHLORIDE 0.9 % IR SOLN
Status: DC | PRN
  Administered 2024-08-28: 1000 mL

## 2024-08-28 MED ORDER — HYDROCHLOROTHIAZIDE 12.5 MG PO TABS
12.5000 mg | ORAL_TABLET | Freq: Every day | ORAL | Status: DC
  Administered 2024-08-29 – 2024-09-02 (×5): 12.5 mg via ORAL
  Filled 2024-08-28 (×5): qty 1

## 2024-08-28 MED ORDER — ONDANSETRON HCL 4 MG/2ML IJ SOLN
INTRAMUSCULAR | Status: DC | PRN
  Administered 2024-08-28: 4 mg via INTRAVENOUS

## 2024-08-28 MED ORDER — ONDANSETRON HCL 4 MG/2ML IJ SOLN
4.0000 mg | Freq: Four times a day (QID) | INTRAMUSCULAR | Status: DC | PRN
  Administered 2024-08-28: 4 mg via INTRAVENOUS
  Filled 2024-08-28: qty 2

## 2024-08-28 MED ORDER — FENTANYL CITRATE (PF) 50 MCG/ML IJ SOSY: 25.0000 ug | PREFILLED_SYRINGE | INTRAMUSCULAR | Status: DC | PRN

## 2024-08-28 MED ORDER — IRBESARTAN 150 MG PO TABS
150.0000 mg | ORAL_TABLET | Freq: Every day | ORAL | Status: DC
  Administered 2024-08-29 – 2024-09-02 (×5): 150 mg via ORAL
  Filled 2024-08-28 (×5): qty 1

## 2024-08-28 MED ORDER — POVIDONE-IODINE 10 % EX SWAB
2.0000 | Freq: Once | CUTANEOUS | Status: AC
  Administered 2024-08-28: 2 via TOPICAL

## 2024-08-28 MED ORDER — MIDAZOLAM HCL 2 MG/2ML IJ SOLN
INTRAMUSCULAR | Status: AC
  Filled 2024-08-28: qty 2

## 2024-08-28 MED ORDER — PROPOFOL 1000 MG/100ML IV EMUL
INTRAVENOUS | Status: AC
  Filled 2024-08-28: qty 100

## 2024-08-28 MED ORDER — PHENOL 1.4 % MT LIQD: 1.0000 | OROMUCOSAL | Status: DC | PRN

## 2024-08-28 MED ORDER — LACTATED RINGERS IV SOLN: INTRAVENOUS | Status: DC

## 2024-08-28 MED ORDER — PHENYLEPHRINE HCL-NACL 20-0.9 MG/250ML-% IV SOLN
INTRAVENOUS | Status: DC | PRN
  Administered 2024-08-28: 50 ug/min via INTRAVENOUS

## 2024-08-28 MED ORDER — METOCLOPRAMIDE HCL 5 MG/ML IJ SOLN
5.0000 mg | Freq: Three times a day (TID) | INTRAMUSCULAR | Status: DC | PRN
  Administered 2024-08-28 – 2024-09-01 (×3): 10 mg via INTRAVENOUS
  Filled 2024-08-28 (×2): qty 2

## 2024-08-28 MED ORDER — ORAL CARE MOUTH RINSE: 15.0000 mL | Freq: Once | OROMUCOSAL | Status: AC

## 2024-08-28 MED ORDER — HYDROMORPHONE HCL 1 MG/ML IJ SOLN
0.5000 mg | INTRAMUSCULAR | Status: DC | PRN
  Administered 2024-09-01: 0.5 mg via INTRAVENOUS
  Filled 2024-08-28: qty 1

## 2024-08-28 MED ORDER — HYDROCODONE-ACETAMINOPHEN 7.5-325 MG PO TABS
1.0000 | ORAL_TABLET | ORAL | Status: DC | PRN
  Administered 2024-08-29 (×2): 1 via ORAL
  Filled 2024-08-28 (×2): qty 1

## 2024-08-28 MED ORDER — METOCLOPRAMIDE HCL 5 MG PO TABS: 5.0000 mg | ORAL_TABLET | Freq: Three times a day (TID) | ORAL | Status: DC | PRN

## 2024-08-28 MED ORDER — DROPERIDOL 2.5 MG/ML IJ SOLN: 0.6250 mg | Freq: Once | INTRAMUSCULAR | Status: DC | PRN

## 2024-08-28 MED ORDER — IRBESARTAN-HYDROCHLOROTHIAZIDE 150-12.5 MG PO TABS: 1.0000 | ORAL_TABLET | Freq: Every day | ORAL | Status: DC

## 2024-08-28 MED ORDER — OXYCODONE HCL 5 MG/5ML PO SOLN: 5.0000 mg | Freq: Once | ORAL | Status: DC | PRN

## 2024-08-28 MED ORDER — PROPOFOL 10 MG/ML IV BOLUS
INTRAVENOUS | Status: AC
  Filled 2024-08-28: qty 20

## 2024-08-28 MED ORDER — METHOCARBAMOL 1000 MG/10ML IJ SOLN: 500.0000 mg | Freq: Four times a day (QID) | INTRAMUSCULAR | Status: DC | PRN

## 2024-08-28 NOTE — Anesthesia Procedure Notes (Deleted)
 Spinal  Patient location during procedure: OR Start time: 08/28/2024 8:48 AM End time: 08/28/2024 8:53 AM Reason for block: surgical anesthesia  Staffing Performed: anesthesiologist  Authorized by: Epifanio Charleston, MD   Performed by: Epifanio Charleston, MD  Preanesthetic Checklist Completed: patient identified, IV checked, site marked, risks and benefits discussed, surgical consent, monitors and equipment checked, pre-op evaluation and timeout performed Spinal Block Patient position: sitting Prep: DuraPrep Patient monitoring: heart rate, cardiac monitor, continuous pulse ox and blood pressure Approach: midline Location: L4-5 Injection technique: single-shot Needle Needle type: Pencan  Needle gauge: 24 G Needle length: 9 cm Assessment Sensory level: T4 Events: CSF return

## 2024-08-28 NOTE — Anesthesia Procedure Notes (Addendum)
 Spinal  Patient location during procedure: OR Start time: 08/28/2024 8:47 AM End time: 08/28/2024 8:53 AM Reason for block: surgical anesthesia  Staffing Performed: resident/CRNA  Authorized by: Epifanio Charleston, MD   Performed by: Therisa Doyal CROME, CRNA  Preanesthetic Checklist Completed: patient identified, IV checked, site marked, risks and benefits discussed, surgical consent, monitors and equipment checked, pre-op evaluation and timeout performed Spinal Block Patient position: sitting Prep: DuraPrep and site prepped and draped Patient monitoring: heart rate, cardiac monitor, continuous pulse ox and blood pressure Approach: midline Location: L3-4 Injection technique: single-shot Needle Needle type: Pencan  Needle gauge: 24 G Needle length: 10 cm Assessment Sensory level: T8 Events: CSF return  Additional Notes Clear free flow CSF, negative heme, negative paresthesia Tolerated well and returned to supine position

## 2024-08-28 NOTE — Plan of Care (Signed)
" °  Problem: Education: Goal: Knowledge of the prescribed therapeutic regimen will improve Outcome: Progressing   Problem: Bowel/Gastric: Goal: Gastrointestinal status for postoperative course will improve Outcome: Progressing   Problem: Cardiac: Goal: Ability to maintain an adequate cardiac output Outcome: Progressing Goal: Will show no evidence of cardiac arrhythmias Outcome: Progressing   Problem: Nutritional: Goal: Will attain and maintain optimal nutritional status Outcome: Progressing   Problem: Neurological: Goal: Will regain or maintain usual level of consciousness Outcome: Progressing   Problem: Clinical Measurements: Goal: Ability to maintain clinical measurements within normal limits Outcome: Progressing Goal: Postoperative complications will be avoided or minimized Outcome: Progressing   Problem: Respiratory: Goal: Will regain and/or maintain adequate ventilation Outcome: Progressing Goal: Respiratory status will improve Outcome: Progressing   Problem: Skin Integrity: Goal: Demonstrates signs of wound healing without infection Outcome: Progressing   Problem: Urinary Elimination: Goal: Will remain free from infection Outcome: Progressing Goal: Ability to achieve and maintain adequate urine output Outcome: Progressing   Problem: Education: Goal: Knowledge of General Education information will improve Description: Including pain rating scale, medication(s)/side effects and non-pharmacologic comfort measures Outcome: Progressing   Problem: Health Behavior/Discharge Planning: Goal: Ability to manage health-related needs will improve Outcome: Progressing   Problem: Clinical Measurements: Goal: Ability to maintain clinical measurements within normal limits will improve Outcome: Progressing Goal: Will remain free from infection Outcome: Progressing Goal: Diagnostic test results will improve Outcome: Progressing Goal: Respiratory complications will  improve Outcome: Progressing Goal: Cardiovascular complication will be avoided Outcome: Progressing   Problem: Activity: Goal: Risk for activity intolerance will decrease Outcome: Progressing   Problem: Nutrition: Goal: Adequate nutrition will be maintained Outcome: Progressing   Problem: Coping: Goal: Level of anxiety will decrease Outcome: Progressing   Problem: Elimination: Goal: Will not experience complications related to bowel motility Outcome: Progressing Goal: Will not experience complications related to urinary retention Outcome: Progressing   Problem: Pain Managment: Goal: General experience of comfort will improve and/or be controlled Outcome: Progressing   Problem: Safety: Goal: Ability to remain free from injury will improve Outcome: Progressing   Problem: Skin Integrity: Goal: Risk for impaired skin integrity will decrease Outcome: Progressing   Problem: Education: Goal: Knowledge of the prescribed therapeutic regimen will improve Outcome: Progressing Goal: Understanding of discharge needs will improve Outcome: Progressing Goal: Individualized Educational Video(s) Outcome: Progressing   Problem: Activity: Goal: Ability to avoid complications of mobility impairment will improve Outcome: Progressing Goal: Ability to tolerate increased activity will improve Outcome: Progressing   Problem: Clinical Measurements: Goal: Postoperative complications will be avoided or minimized Outcome: Progressing   Problem: Pain Management: Goal: Pain level will decrease with appropriate interventions Outcome: Progressing   Problem: Skin Integrity: Goal: Will show signs of wound healing Outcome: Progressing   "

## 2024-08-28 NOTE — Interval H&P Note (Signed)
 History and Physical Interval Note: The patient is here today for a left total hip replacement to treat her significant left hip pain and arthritis.  There has been no acute or interval change in her medical status.  The risks and benefits of surgery have been discussed in detail and informed consent has been obtained.  The left operative hip has been marked.  08/28/2024 7:00 AM  [name]  has presented today for surgery, with the diagnosis of Osteoarthritis Left Hip.  The various methods of treatment have been discussed with the patient and family. After consideration of risks, benefits and other options for treatment, the patient has consented to  Procedures: ARTHROPLASTY, HIP, TOTAL, ANTERIOR APPROACH (Left) as a surgical intervention.  The patient's history has been reviewed, patient examined, no change in status, stable for surgery.  I have reviewed the patient's chart and labs.  Questions were answered to the patient's satisfaction.     [name]

## 2024-08-28 NOTE — Discharge Instructions (Signed)
 Per [facility] clinic policy, our goal is ensure optimal postoperative pain control with a multimodal pain management strategy. For all [facility] patients, our goal is to wean post-operative narcotic medications by 6 weeks post-operatively. If this is not possible due to utilization of pain medication prior to surgery, your [facility] doctor will support your acute post-operative pain control for the first 6 weeks postoperatively, with a plan to transition you back to your primary pain team following that. [name] will work to ensure a Therapist, occupational.  INSTRUCTIONS AFTER JOINT REPLACEMENT   Remove items at home which could result in a fall. This includes throw rugs or furniture in walking pathways ICE to the affected joint every three hours while awake for 30 minutes at a time, for at least the first 3-5 days, and then as needed for pain and swelling.  Continue to use ice for pain and swelling. You may notice swelling that will progress down to the foot and ankle.  This is normal after surgery.  Elevate your leg when you are not up walking on it.   Continue to use the breathing machine you got in the hospital (incentive spirometer) which will help keep your temperature down.  It is common for your temperature to cycle up and down following surgery, especially at night when you are not up moving around and exerting yourself.  The breathing machine keeps your lungs expanded and your temperature down.   DIET:  As you were doing prior to hospitalization, we recommend a well-balanced diet.  DRESSING / WOUND CARE / SHOWERING  Keep the surgical dressing until follow up.  The dressing is water  proof, so you can shower without any extra covering.  IF THE DRESSING FALLS OFF or the wound gets wet inside, change the dressing with sterile gauze.  Please use good hand washing techniques before changing the dressing.  Do not use any lotions or creams on the incision until instructed by your surgeon.     ACTIVITY  Increase activity slowly as tolerated, but follow the weight bearing instructions below.   No driving for 6 weeks or until further direction given by your physician.  You cannot drive while taking narcotics.  No lifting or carrying greater than 10 lbs. until further directed by your surgeon. Avoid periods of inactivity such as sitting longer than an hour when not asleep. This helps prevent blood clots.  You may return to work once you are authorized by your doctor.     WEIGHT BEARING   Weight bearing as tolerated with assist device (walker, cane, etc) as directed, use it as long as suggested by your surgeon or therapist, typically at least 4-6 weeks.   EXERCISES  Results after joint replacement surgery are often greatly improved when you follow the exercise, range of motion and muscle strengthening exercises prescribed by your doctor. Safety measures are also important to protect the joint from further injury. Any time any of these exercises cause you to have increased pain or swelling, decrease what you are doing until you are comfortable again and then slowly increase them. If you have problems or questions, call your caregiver or physical therapist for advice.   Rehabilitation is important following a joint replacement. After just a few days of immobilization, the muscles of the leg can become weakened and shrink (atrophy).  These exercises are designed to build up the tone and strength of the thigh and leg muscles and to improve motion. Often times heat used for twenty to thirty minutes before  working out will loosen up your tissues and help with improving the range of motion but do not use heat for the first two weeks following surgery (sometimes heat can increase post-operative swelling).   These exercises can be done on a training (exercise) mat, on the floor, on a table or on a bed. Use whatever works the best and is most comfortable for you.    Use music or television  while you are exercising so that the exercises are a pleasant break in your day. This will make your life better with the exercises acting as a break in your routine that you can look forward to.   Perform all exercises about fifteen times, three times per day or as directed.  You should exercise both the operative leg and the other leg as well.  Exercises include:   Quad Sets - Tighten up the muscle on the front of the thigh (Quad) and hold for 5-10 seconds.   Straight Leg Raises - With your knee straight (if you were given a brace, keep it on), lift the leg to 60 degrees, hold for 3 seconds, and slowly lower the leg.  Perform this exercise against resistance later as your leg gets stronger.  Leg Slides: Lying on your back, slowly slide your foot toward your buttocks, bending your knee up off the floor (only go as far as is comfortable). Then slowly slide your foot back down until your leg is flat on the floor again.  Angel Wings: Lying on your back spread your legs to the side as far apart as you can without causing discomfort.  Hamstring Strength:  Lying on your back, push your heel against the floor with your leg straight by tightening up the muscles of your buttocks.  Repeat, but this time bend your knee to a comfortable angle, and push your heel against the floor.  You may put a pillow under the heel to make it more comfortable if necessary.   A rehabilitation program following joint replacement surgery can speed recovery and prevent re-injury in the future due to weakened muscles. Contact your doctor or a physical therapist for more information on knee rehabilitation.    CONSTIPATION  Constipation is defined medically as fewer than three stools per week and severe constipation as less than one stool per week.  Even if you have a regular bowel pattern at home, your normal regimen is likely to be disrupted due to multiple reasons following surgery.  Combination of anesthesia, postoperative  narcotics, change in appetite and fluid intake all can affect your bowels.   YOU MUST use at least one of the following options; they are listed in order of increasing strength to get the job done.  They are all available over the counter, and you may need to use some, POSSIBLY even all of these options:    Drink plenty of fluids (prune juice may be helpful) and high fiber foods Colace 100 mg by mouth twice a day  Senokot for constipation as directed and as needed Dulcolax (bisacodyl), take with full glass of water  Miralax (polyethylene glycol) once or twice a day as needed.  If you have tried all these things and are unable to have a bowel movement in the first 3-4 days after surgery call either your surgeon or your primary doctor.    If you experience loose stools or diarrhea, hold the medications until you stool forms back up.  If your symptoms do not get better within 1 week  or if they get worse, check with your doctor.  If you experience the worst abdominal pain ever or develop nausea or vomiting, please contact the office immediately for further recommendations for treatment.   ITCHING:  If you experience itching with your medications, try taking only a single pain pill, or even half a pain pill at a time.  You can also use Benadryl  over the counter for itching or also to help with sleep.   TED HOSE STOCKINGS:  Use stockings on both legs until for at least 2 weeks or as directed by physician office. They may be removed at night for sleeping.  MEDICATIONS:  See your medication summary on the "After Visit Summary" that nursing will review with you.  You may have some home medications which will be placed on hold until you complete the course of blood thinner medication.  It is important for you to complete the blood thinner medication as prescribed.  PRECAUTIONS:  If you experience chest pain or shortness of breath - call 911 immediately for transfer to the hospital emergency department.    If you develop a fever greater that 101 F, purulent drainage from wound, increased redness or drainage from wound, foul odor from the wound/dressing, or calf pain - CONTACT YOUR SURGEON.                                                   FOLLOW-UP APPOINTMENTS:  If you do not already have a post-op appointment, please call the office for an appointment to be seen by your surgeon.  Guidelines for how soon to be seen are listed in your "After Visit Summary", but are typically between 1-4 weeks after surgery.  OTHER INSTRUCTIONS:   Knee Replacement:  Do not place pillow under knee, focus on keeping the knee straight while resting. CPM instructions: 0-90 degrees, 2 hours in the morning, 2 hours in the afternoon, and 2 hours in the evening. Place foam block, curve side up under heel at all times except when in CPM or when walking.  DO NOT modify, tear, cut, or change the foam block in any way.  POST-OPERATIVE OPIOID TAPER INSTRUCTIONS: It is important to wean off of your opioid medication as soon as possible. If you do not need pain medication after your surgery it is ok to stop day one. Opioids include: Codeine, Hydrocodone(Norco, Vicodin), Oxycodone (Percocet, oxycontin ) and hydromorphone  amongst others.  Long term and even short term use of opiods can cause: Increased pain response Dependence Constipation Depression Respiratory depression And more.  Withdrawal symptoms can include Flu like symptoms Nausea, vomiting And more Techniques to manage these symptoms Hydrate well Eat regular healthy meals Stay active Use relaxation techniques(deep breathing, meditating, yoga) Do Not substitute Alcohol to help with tapering If you have been on opioids for less than two weeks and do not have pain than it is ok to stop all together.  Plan to wean off of opioids This plan should start within one week post op of your joint replacement. Maintain the same interval or time between taking each dose  and first decrease the dose.  Cut the total daily intake of opioids by one tablet each day Next start to increase the time between doses. The last dose that should be eliminated is the evening dose.   MAKE SURE YOU:  Understand these instructions.  Get help right away if you are not doing well or get worse.    Thank you for letting us  be a part of your medical care team.  It is a privilege we respect greatly.  We hope these instructions will help you stay on track for a fast and full recovery!     Dental Antibiotics:  In most cases prophylactic antibiotics for Dental procdeures after total joint surgery are not necessary.  Exceptions are as follows:  1. History of prior total joint infection  2. Severely immunocompromised (Organ Transplant, cancer chemotherapy, Rheumatoid biologic meds such as Humera)  3. Poorly controlled diabetes (A1C &gt; 8.0, blood glucose over 200)  If you have one of these conditions, contact your surgeon for an antibiotic prescription, prior to your dental procedure.

## 2024-08-28 NOTE — Transfer of Care (Signed)
 Immediate Anesthesia Transfer of Care Note  Patient: [name]  Procedure(s) Performed: ARTHROPLASTY, HIP, TOTAL, ANTERIOR APPROACH (Left: Hip)  Patient Location: PACU  Anesthesia Type:Spinal  Level of Consciousness: drowsy  Airway & Oxygen Therapy: Patient Spontanous Breathing and Patient connected to face mask oxygen  Post-op Assessment: Report given to RN and Post -op Vital signs reviewed and stable  Post vital signs: Reviewed and stable  Last Vitals:  Vitals Value Taken Time  BP 97/64 08/28/24 10:16  Temp    Pulse 81 08/28/24 10:17  Resp 25 08/28/24 10:17  SpO2 96 % 08/28/24 10:17  Vitals shown include unfiled device data.  Last Pain:  Vitals:   08/28/24 0718  TempSrc: Oral  PainSc:          Complications: No notable events documented.

## 2024-08-28 NOTE — Anesthesia Postprocedure Evaluation (Signed)
"   Anesthesia Post Note  Patient: [name]  Procedure(s) Performed: ARTHROPLASTY, HIP, TOTAL, ANTERIOR APPROACH (Left: Hip)     Patient location during evaluation: PACU Anesthesia Type: Spinal Level of consciousness: awake and alert Pain management: pain level controlled Vital Signs Assessment: post-procedure vital signs reviewed and stable Respiratory status: spontaneous breathing and respiratory function stable Cardiovascular status: blood pressure returned to baseline and stable Postop Assessment: spinal receding Anesthetic complications: no   No notable events documented.  Last Vitals:  Vitals:   08/28/24 1453 08/28/24 1724  BP: 125/78 120/81  Pulse: 91 81  Resp: 16 16  Temp: 36.7 C (!) 36.3 C  SpO2: 95% 97%    Last Pain:  Vitals:   08/28/24 1805  TempSrc:   PainSc: Asleep                 Epifanio Lamar BRAVO      "

## 2024-08-28 NOTE — Op Note (Signed)
 "    Operative Note  Date of operation: 08/28/2024 Preoperative diagnosis: Left hip primary osteoarthritis Postoperative diagnosis: Same  Procedure: Left direct anterior total hip arthroplasty  Implants: Implant Name Type Inv. Item Serial No. Manufacturer Lot No. LRB No. Used Action  PINN ALTRX NEUT ID X OD [id number] - [id number]  PINN ALTRX NEUT ID X OD [id number]  [employer]PU[employer] [id number] Left 1 Implanted  CUP ACET PINNACLE SECTR - [id number] Joint CUP ACET PINNACLE SECTR  [employer]PU[employer] [id number] Left 1 Implanted  STEM FEM ACTIS HIGH SZ2 - [id number] Stem STEM FEM ACTIS HIGH SZ2  DEPUY ORTH[employer] [id number] Left 1 Implanted  HEAD FEM STD 32X+1 STRL - [id number] Hips HEAD FEM STD 32X+1 STRL  [employer]PU[employer] [id number] Left 1 Implanted   Surgeon: Lonni GRADE. Vernetta, MD Assistant: Tory Gaskins, PA-C  Anesthesia: Spinal Antibiotics: IV vanc EBL: 50 cc Complications: None  Indications: The patient is a 74 year old female with debilitating arthritis involving her left hip.  This has been well-documented with clinical exam and x-ray findings.  She has tried and failed conservative treatment and at this point her left hip pain is daily and it is detrimentally affecting her mobility, her quality of life and her actives day living to the point she does wish to proceed with a hip replacement and we agree with this as well.  We did discuss in length in detail the risks of acute blood loss anemia, nerve and vessel injury, fracture, infection, dislocation, DVT, implant failure and leg length differences as well as wound healing issues.  She understands that our goals are hopefully decreased pain, improved mobility and improved quality of life.  Procedure description: After informed consent was obtained the appropriate left hip was marked, the patient was brought to the operating room and set up on the stretcher where spinal anesthesia was obtained.  She was then laid supine position on stretcher  and a Foley catheter was placed.  Traction boots are placed on both her feet and then she was placed supine on the Hana fracture table with a perineal post in place and both legs in inline skeletal traction vices but no traction applied.  Her left operative hip and pelvis were assessed radiographically.  The left hip was prepped and draped in DuraPrep and sterile drapes.  A timeout was called and she was notified of the correct patient the correct left hip.  An incision was then made just inferior and posterior to the ASIS and carried slightly obliquely down the leg.  Dissection was carried down to the tensor fascia lata muscle and the tensor fascia was divided longitudinally to proceed with a direct anterior approach to the hip.  Circumflex vessels were identified and cauterized.  The hip capsule was identified and opened up in L-type format.  Cobra retractors were placed around the medial and lateral femoral neck and a femoral neck cut was made with an oscillating saw just proximal to the lesser trochanter and this cut was completed with an osteotome.  A corkscrew guide was placed in the femoral head and the femoral head was removed in its entirety and there was a wide area devoid of cartilage.  A bent Hohmann was then placed over the medial acetabular rim and the acetabular labrum and other debris removed.  Reaming was then initiated from a size 43 reamer and stepwise increments going up to a size 47 reamer with all reamers placed under direct visualization and the last reamer also under direct fluoroscopy in  order to obtain the depth of reaming, the inclination and the anteversion.  The real DePuy Sectra GRIPTION acetabular component size 48 was then placed without difficulty followed by a 32+4 polythene liner.  Attention was then turned to the femur.  With the left leg externally rotated to 120 degrees, extended and adducted, a Mueller retractors placed medially and a Hohmann tractor by the greater trochanter.   The lateral joint capsule was released and a box cutting osteotome was used into the femoral canal.  Broaching was then initiated using the Actis broaching system from a size 0 going up to a size 2.  We then trialed a standard offset femoral neck and a 32+1 trial head ball.  The left leg was brought over and up and with traction and internal rotation reduced in the pelvis.  Based on radiographic assessment we needed more offset.  We have made her longer but we do not have other options.  We then dislocated the hip and remove the trial components.  We then placed the real DePuy Actis femoral component size 2 with high offset and went with the real 32+1 metal head ball.  This reduced in the pelvis and we are very pleased with range of motion and stability as well as offset.  It was nice and tight but we do feel like we increased her leg lengths and she may be a little bit longer but stability was more important.  We irrigated the soft tissue with normal saline solution.  The joint capsule was then closed with interrupted #1 Ethibond suture followed by #1 Vicryl to close the tensor fascia.  0 Vicryl is used to close the deep tissue and 2-0 Vicryl was used to close subcutaneous tissue.  The skin was closed with staples.  An Aquacel dressing was applied.  The patient was taken the recovery room.  Tory Gaskins, PA-C did assist during the entire case and beginning and his assistance was crucial and medically necessary for soft tissue management and retraction, helping guide implant placement and a layered closure of the wound. "

## 2024-08-29 DIAGNOSIS — Z791 Long term (current) use of non-steroidal anti-inflammatories (NSAID): Secondary | ICD-10-CM | POA: Diagnosis not present

## 2024-08-29 DIAGNOSIS — M1612 Unilateral primary osteoarthritis, left hip: Secondary | ICD-10-CM | POA: Diagnosis present

## 2024-08-29 DIAGNOSIS — Z885 Allergy status to narcotic agent status: Secondary | ICD-10-CM | POA: Diagnosis not present

## 2024-08-29 DIAGNOSIS — Z888 Allergy status to other drugs, medicaments and biological substances status: Secondary | ICD-10-CM | POA: Diagnosis not present

## 2024-08-29 DIAGNOSIS — Z853 Personal history of malignant neoplasm of breast: Secondary | ICD-10-CM | POA: Diagnosis not present

## 2024-08-29 DIAGNOSIS — Z803 Family history of malignant neoplasm of breast: Secondary | ICD-10-CM | POA: Diagnosis not present

## 2024-08-29 DIAGNOSIS — Z9181 History of falling: Secondary | ICD-10-CM | POA: Diagnosis not present

## 2024-08-29 DIAGNOSIS — K589 Irritable bowel syndrome without diarrhea: Secondary | ICD-10-CM | POA: Diagnosis present

## 2024-08-29 DIAGNOSIS — E781 Pure hyperglyceridemia: Secondary | ICD-10-CM | POA: Diagnosis present

## 2024-08-29 DIAGNOSIS — G479 Sleep disorder, unspecified: Secondary | ICD-10-CM | POA: Diagnosis present

## 2024-08-29 DIAGNOSIS — R7303 Prediabetes: Secondary | ICD-10-CM | POA: Diagnosis present

## 2024-08-29 DIAGNOSIS — I10 Essential (primary) hypertension: Secondary | ICD-10-CM | POA: Diagnosis present

## 2024-08-29 LAB — CBC
HCT: 32.5 % — ABNORMAL LOW (ref 36.0–46.0)
Hemoglobin: 11.3 g/dL — ABNORMAL LOW (ref 12.0–15.0)
MCH: 31.4 pg (ref 26.0–34.0)
MCHC: 34.8 g/dL (ref 30.0–36.0)
MCV: 90.3 fL (ref 80.0–100.0)
Platelets: 259 K/uL (ref 150–400)
RBC: 3.6 MIL/uL — ABNORMAL LOW (ref 3.87–5.11)
RDW: 12.3 % (ref 11.5–15.5)
WBC: 8.9 K/uL (ref 4.0–10.5)
nRBC: 0 % (ref 0.0–0.2)

## 2024-08-29 LAB — BASIC METABOLIC PANEL WITH GFR
Anion gap: 12 (ref 5–15)
BUN: 15 mg/dL (ref 8–23)
CO2: 20 mmol/L — ABNORMAL LOW (ref 22–32)
Calcium: 8.3 mg/dL — ABNORMAL LOW (ref 8.9–10.3)
Chloride: 106 mmol/L (ref 98–111)
Creatinine, Ser: 0.69 mg/dL (ref 0.44–1.00)
GFR, Estimated: >60 mL/min
Glucose, Bld: 198 mg/dL — ABNORMAL HIGH (ref 70–99)
Potassium: 4 mmol/L (ref 3.5–5.1)
Sodium: 139 mmol/L (ref 135–145)

## 2024-08-29 NOTE — Plan of Care (Signed)

## 2024-08-29 NOTE — TOC Transition Note (Signed)
 Transition of Care [facility]) - Discharge Note   Patient Details  Name: [name] MRN: [id number] Date of Birth: 03-Sep-1950  Transition of [facility] CM/SW Contact:  Alfonse JONELLE Rex, RN Phone Number: 08/29/2024, 12:46 PM   Clinical Narrative:   Met with patient at bedside to review dc therapy and home equipment needs, patient resides at [facility], will receive [facility] PT onsite, has RW. No CM needs     Final next level of care: Home w Home Health Services Barriers to Discharge: No Barriers Identified   Patient Goals and CMS Choice Patient states their goals for this hospitalization and ongoing recovery are:: return home          Discharge Placement                       Discharge Plan and Services Additional resources added to the After Visit Summary for                  DME Arranged:  (RW- husband has one, but may not be correct size. May need Jr. walker)         HH Arranged: PT HH Agency:  [facility] will provide therapy)        Social Drivers of Health (SDOH) Interventions SDOH Screenings   Food Insecurity: No Food Insecurity (08/28/2024)  Housing: Low Risk (08/28/2024)  Transportation Needs: No Transportation Needs (08/28/2024)  Utilities: Not At Risk (08/28/2024)  Social Connections: Socially Integrated (08/28/2024)  Tobacco Use: Low Risk (08/28/2024)     Readmission Risk Interventions    08/29/2024   12:45 PM 05/28/2022    9:22 AM 05/27/2022   10:59 AM  Readmission Risk Prevention Plan  Post Dischage Appt Complete Complete Complete  Medication Screening Complete Complete Complete  Transportation Screening Complete Complete Complete

## 2024-08-29 NOTE — Progress Notes (Signed)
 Physical Therapy Treatment Patient Details Name: [name] MRN: [id number] DOB: 03-17-1951 Today's Date: 08/29/2024   History of Present Illness Pt s/p L THR and with hx of breast CA    PT Comments  Pt continues very cooperative and with noted improvement in activity tolerance after accepting premed for pain.  Pt up to ambulate increased distance but continues very unsteady on feet and at high risk for falling. Pt reports has discussed with spouse, who is limited in ability to assist, and now hoping to follow up in rehab setting at [facility] prior to return to IND living.   If plan is discharge home, recommend the following: A little help with walking and/or transfers;A little help with bathing/dressing/bathroom;Assistance with cooking/housework;Assist for transportation;Help with stairs or ramp for entrance   Can travel by private vehicle     Yes  Equipment Recommendations  None recommended by PT    Recommendations for Other Services       Precautions / Restrictions Precautions Precautions: Fall Restrictions Weight Bearing Restrictions Per Provider Order: Yes LLE Weight Bearing Per Provider Order: Weight bearing as tolerated     Mobility  Bed Mobility Overal bed mobility: Needs Assistance Bed Mobility: Sit to Supine     Supine to sit: Min assist, Mod assist, Used rails Sit to supine: Min assist, Mod assist, Used rails   General bed mobility comments: cues for sequence and use of R LE to self assist    Transfers Overall transfer level: Needs assistance Equipment used: Rolling walker (2 wheels) Transfers: Sit to/from Stand Sit to Stand: Min assist   Step pivot transfers: Min assist, From elevated surface       General transfer comment: cues for LE management and use of UEs to self assist.  Physical assist to bring wt up and fwd and to balance in standing with RW    Ambulation/Gait Ambulation/Gait assistance: Contact guard assist,  Supervision Gait Distance (Feet): 24 Feet (twice) Assistive device: Rolling walker (2 wheels) Gait Pattern/deviations: Step-to pattern, Decreased step length - right, Decreased step length - left, Shuffle, Trunk flexed Gait velocity: decr     General Gait Details: cues for sequence, posture, position from RW, safety awareness; distance ltd by c/o pain   Stairs             Wheelchair Mobility     Tilt Bed    Modified Rankin (Stroke Patients Only)       Balance Overall balance assessment: Needs assistance Sitting-balance support: Feet supported, No upper extremity supported Sitting balance-Leahy Scale: Good     Standing balance support: Bilateral upper extremity supported Standing balance-Leahy Scale: Poor                              Communication Communication Communication: No apparent difficulties  Cognition Arousal: Alert Behavior During Therapy: Impulsive, Anxious   PT - Cognitive impairments: No apparent impairments                         Following commands: Intact      Cueing Cueing Techniques: Verbal cues  Exercises Total Joint Exercises Ankle Circles/Pumps: AROM, Both, Supine    General Comments        Pertinent Vitals/Pain Pain Assessment Pain Assessment: 0-10 Pain Score: 5  Faces Pain Scale: Hurts even more Pain Location: L hip Pain Descriptors / Indicators: Aching, Burning, Sharp, Stabbing Pain Intervention(s): Limited activity within patient's  tolerance, Monitored during session, Premedicated before session, Ice applied    Home Living                          Prior Function            PT Goals (current goals can now be found in the care plan section) Acute Rehab PT Goals Patient Stated Goal: Regain IND PT Goal Formulation: With patient Time For Goal Achievement: 09/05/24 Potential to Achieve Goals: Good Progress towards PT goals: Progressing toward goals    Frequency    7X/week       PT Plan      Co-evaluation              AM-PAC PT 6 Clicks Mobility   Outcome Measure  Help needed turning from your back to your side while in a flat bed without using bedrails?: A Little Help needed moving from lying on your back to sitting on the side of a flat bed without using bedrails?: A Little Help needed moving to and from a bed to a chair (including a wheelchair)?: A Little Help needed standing up from a chair using your arms (e.g., wheelchair or bedside chair)?: A Little Help needed to walk in hospital room?: A Little Help needed climbing 3-5 steps with a railing? : A Lot 6 Click Score: 17    End of Session Equipment Utilized During Treatment: Gait belt Activity Tolerance: Patient limited by pain Patient left: in bed;with call bell/phone within reach;with bed alarm set Nurse Communication: Mobility status PT Visit Diagnosis: Difficulty in walking, not elsewhere classified (R26.2)     Time: 8669-8646 PT Time Calculation (min) (ACUTE ONLY): 23 min  Charges:    $Gait Training: 23-37 mins PT General Charges $$ ACUTE PT VISIT: 1 Visit                     [facility] PT Acute Rehabilitation Services Office [phone]    Babette Stum 08/29/2024, 3:19 PM

## 2024-08-29 NOTE — Progress Notes (Signed)
 Physical Therapy Treatment Patient Details Name: [name] MRN: [id number] DOB: Aug 22, 1950 Today's Date: 08/29/2024   History of Present Illness Pt s/p L THR and with hx of breast CA    PT Comments  Pt continues cooperative but progress pain limited.  Pt agreeable to muscle relaxor only prior to PT but now agreeable to pain meds with lunch and will reattempt afterwards.  Pt reports she and spouse in agreement that follow up at [facility] rehab setting would be safest way to proceed at point of dc.    If plan is discharge home, recommend the following: A little help with walking and/or transfers;A little help with bathing/dressing/bathroom;Assistance with cooking/housework;Assist for transportation;Help with stairs or ramp for entrance   Can travel by private vehicle        Equipment Recommendations  None recommended by PT    Recommendations for Other Services       Precautions / Restrictions Precautions Precautions: Fall Restrictions Weight Bearing Restrictions Per Provider Order: Yes LLE Weight Bearing Per Provider Order: Weight bearing as tolerated     Mobility  Bed Mobility Overal bed mobility: Needs Assistance Bed Mobility: Supine to Sit     Supine to sit: Min assist, Mod assist, Used rails     General bed mobility comments: Pt up in chair and returns to same    Transfers Overall transfer level: Needs assistance Equipment used: Rolling walker (2 wheels) Transfers: Sit to/from Stand Sit to Stand: Min assist   Step pivot transfers: Min assist, From elevated surface       General transfer comment: cues for LE management and use of UEs to self assist.  Physical assist to bring wt up and fwd and to balance in standing with RW.    Ambulation/Gait Ambulation/Gait assistance: Min assist Gait Distance (Feet): 24 Feet Assistive device: Rolling walker (2 wheels) Gait Pattern/deviations: Step-to pattern, Decreased step length - right, Decreased step  length - left, Shuffle, Trunk flexed       General Gait Details: cues for sequence, posture, position from RW, safety awareness; distance ltd by c/o pain   Stairs             Wheelchair Mobility     Tilt Bed    Modified Rankin (Stroke Patients Only)       Balance Overall balance assessment: Needs assistance Sitting-balance support: Feet supported, No upper extremity supported Sitting balance-Leahy Scale: Good     Standing balance support: Bilateral upper extremity supported Standing balance-Leahy Scale: Poor                              Communication Communication Communication: No apparent difficulties  Cognition Arousal: Alert Behavior During Therapy: Impulsive, Anxious   PT - Cognitive impairments: No apparent impairments                         Following commands: Intact      Cueing Cueing Techniques: Verbal cues  Exercises Total Joint Exercises Ankle Circles/Pumps: AROM, Both, Supine    General Comments        Pertinent Vitals/Pain Pain Assessment Pain Assessment: Faces Faces Pain Scale: Hurts even more Pain Location: L hip Pain Descriptors / Indicators: Aching, Burning, Sharp, Stabbing Pain Intervention(s): Limited activity within patient's tolerance, Monitored during session, Premedicated before session, Ice applied (Pt agreeable to muscle relaxor only prior to PT but now agreeable to pain meds with lunch and  will reattempt afterwards)    Home Living Family/patient expects to be discharged to:: Unsure                   Additional Comments: Pt lives at [facility] with spouse who is using RW and limited in ability to assist.    Prior Function            PT Goals (current goals can now be found in the care plan section) Acute Rehab PT Goals Patient Stated Goal: Regain IND PT Goal Formulation: With patient Time For Goal Achievement: 09/05/24 Potential to Achieve Goals: Good Progress towards PT goals:  Progressing toward goals    Frequency    7X/week      PT Plan      Co-evaluation              AM-PAC PT 6 Clicks Mobility   Outcome Measure  Help needed turning from your back to your side while in a flat bed without using bedrails?: A Little Help needed moving from lying on your back to sitting on the side of a flat bed without using bedrails?: A Little Help needed moving to and from a bed to a chair (including a wheelchair)?: A Little Help needed standing up from a chair using your arms (e.g., wheelchair or bedside chair)?: A Little Help needed to walk in hospital room?: A Little Help needed climbing 3-5 steps with a railing? : A Lot 6 Click Score: 17    End of Session Equipment Utilized During Treatment: Gait belt Activity Tolerance: Patient limited by pain Patient left: in chair;with call bell/phone within reach;with chair alarm set;with nursing/sitter in room Nurse Communication: Mobility status PT Visit Diagnosis: Difficulty in walking, not elsewhere classified (R26.2)     Time: 8863-8851 PT Time Calculation (min) (ACUTE ONLY): 12 min  Charges:    $Gait Training: 8-22 mins $Therapeutic Activity: 8-22 mins PT General Charges $$ ACUTE PT VISIT: 1 Visit                     [facility] PT Acute Rehabilitation Services Office [phone]    Haniyyah Sakuma 08/29/2024, 12:19 PM

## 2024-08-29 NOTE — Evaluation (Signed)
 Physical Therapy Evaluation Patient Details Name: [name] MRN: [id number] DOB: [birth year] Today's Date: 08/29/2024  History of Present Illness  Pt s/p L THR and with hx of breast CA  Clinical Impression  Pt s/p L THR and presents with decreased L LE strength/ROM, elevated anxiety level and post op pain limiting functional mobility.  Pt hopes to progress to dc home to [facility] but could benefit from initial stay in rehab setting 2* spouse's limited ability to assist.      If plan is discharge home, recommend the following: A little help with walking and/or transfers;A little help with bathing/dressing/bathroom;Assistance with cooking/housework;Assist for transportation;Help with stairs or ramp for entrance   Can travel by private vehicle        Equipment Recommendations None recommended by PT  Recommendations for Other Services       Functional Status Assessment Patient has had a recent decline in their functional status and demonstrates the ability to make significant improvements in function in a reasonable and predictable amount of time.     Precautions / Restrictions Precautions Precautions: Fall Restrictions Weight Bearing Restrictions Per Provider Order: Yes LLE Weight Bearing Per Provider Order: Weight bearing as tolerated      Mobility  Bed Mobility Overal bed mobility: Needs Assistance Bed Mobility: Supine to Sit     Supine to sit: Min assist, Mod assist, Used rails     General bed mobility comments: Increased time with cues for sequence, use of R LE to self assist and to slow down for safety    Transfers Overall transfer level: Needs assistance Equipment used: Rolling walker (2 wheels) Transfers: Sit to/from Stand, Bed to chair/wheelchair/BSC Sit to Stand: Min assist, From elevated surface   Step pivot transfers: Min assist, From elevated surface       General transfer comment: cues for LE management and use of UEs to self assist.   Physical assist to bring wt up and fwd and to balance in standing with RW.  Step pvt bed to [facility]    Ambulation/Gait Ambulation/Gait assistance: Min assist Gait Distance (Feet): 8 Feet Assistive device: Rolling walker (2 wheels) Gait Pattern/deviations: Step-to pattern, Decreased step length - right, Decreased step length - left, Shuffle, Trunk flexed       General Gait Details: cues for sequence, posture, position from RW, safety awareness; distance ltd by c/o pain  Stairs            Wheelchair Mobility     Tilt Bed    Modified Rankin (Stroke Patients Only)       Balance Overall balance assessment: Needs assistance Sitting-balance support: Feet supported, No upper extremity supported Sitting balance-Leahy Scale: Good     Standing balance support: Bilateral upper extremity supported Standing balance-Leahy Scale: Poor                               Pertinent Vitals/Pain Pain Assessment Pain Assessment: Faces Faces Pain Scale: Hurts even more Pain Location: L hip Pain Descriptors / Indicators: Aching, Burning, Sharp, Stabbing Pain Intervention(s): Limited activity within patient's tolerance, Monitored during session, Patient requesting pain meds-RN notified, Ice applied    Home Living Family/patient expects to be discharged to:: Unsure                   Additional Comments: Pt lives at [facility] with spouse who is using RW and limited in ability to assist.  Prior Function Prior Level of Function : Independent/Modified Independent             Mobility Comments: Using RW as needed       Extremity/Trunk Assessment   Upper Extremity Assessment Upper Extremity Assessment: Overall WFL for tasks assessed    Lower Extremity Assessment Lower Extremity Assessment: LLE deficits/detail       Communication   Communication Communication: No apparent difficulties    Cognition Arousal: Alert Behavior During Therapy: Impulsive,  Anxious   PT - Cognitive impairments: No apparent impairments                         Following commands: Intact       Cueing Cueing Techniques: Verbal cues     General Comments      Exercises Total Joint Exercises Ankle Circles/Pumps: AROM, Both, Supine   Assessment/Plan    PT Assessment Patient needs continued PT services  PT Problem List Decreased strength;Decreased range of motion;Decreased activity tolerance;Decreased balance;Decreased mobility;Decreased knowledge of use of DME;Pain       PT Treatment Interventions DME instruction;Gait training;Stair training;Functional mobility training;Therapeutic activities;Therapeutic exercise;Patient/family education    PT Goals (Current goals can be found in the Care Plan section)  Acute Rehab PT Goals Patient Stated Goal: Regain IND PT Goal Formulation: With patient Time For Goal Achievement: 09/05/24 Potential to Achieve Goals: Good    Frequency 7X/week     Co-evaluation               AM-PAC PT 6 Clicks Mobility  Outcome Measure Help needed turning from your back to your side while in a flat bed without using bedrails?: A Little Help needed moving from lying on your back to sitting on the side of a flat bed without using bedrails?: A Little Help needed moving to and from a bed to a chair (including a wheelchair)?: A Little Help needed standing up from a chair using your arms (e.g., wheelchair or bedside chair)?: A Little Help needed to walk in hospital room?: A Little Help needed climbing 3-5 steps with a railing? : A Lot 6 Click Score: 17    End of Session Equipment Utilized During Treatment: Gait belt Activity Tolerance: Patient limited by pain Patient left: in chair;with call bell/phone within reach;with chair alarm set;with nursing/sitter in room Nurse Communication: Mobility status PT Visit Diagnosis: Difficulty in walking, not elsewhere classified (R26.2)    Time: 9089-9063 PT Time  Calculation (min) (ACUTE ONLY): 26 min   Charges:   PT Evaluation $PT Eval Low Complexity: 1 Low PT Treatments $Therapeutic Activity: 8-22 mins PT General Charges $$ ACUTE PT VISIT: 1 Visit         [facility] PT Acute Rehabilitation Services Office [phone]   Alexanderia Gorby 08/29/2024, 10:12 AM

## 2024-08-29 NOTE — Progress Notes (Signed)
 Subjective: 1 Day Post-Op Procedures (LRB): ARTHROPLASTY, HIP, TOTAL, ANTERIOR APPROACH (Left) Patient reports pain as moderate.  Slow progress with PT.   Objective: Vital signs in last 24 hours: Temp:  [97.4 F (36.3 C)-98.2 F (36.8 C)] 98 F (36.7 C) (01/17 0935) Pulse Rate:  [73-104] 78 (01/17 0935) Resp:  [12-21] 16 (01/17 0935) BP: (97-139)/(64-83) 139/75 (01/17 0935) SpO2:  [93 %-100 %] 97 % (01/17 0935) Weight:  [62.5 kg] 62.5 kg (01/16 1446)  Intake/Output from previous day: 01/16 0701 - 01/17 0700 In: 2769.7 [P.O.:480; I.V.:1882.2; IV Piggyback:407.5] Out: 2300 [Urine:2250; Blood:50] Intake/Output this shift: No intake/output data recorded.  Recent Labs    08/29/24 0327  HGB 11.3*   Recent Labs    08/29/24 0327  WBC 8.9  RBC 3.60*  HCT 32.5*  PLT 259   Recent Labs    08/29/24 0327  NA 139  K 4.0  CL 106  CO2 20*  BUN 15  CREATININE 0.69  GLUCOSE 198*  CALCIUM 8.3*   No results for input(s): LABPT, INR in the last 72 hours.  Dorsiflexion/Plantar flexion intact Incision: dressing C/D/I Compartment soft   Assessment/Plan: 1 Day Post-Op Procedures (LRB): ARTHROPLASTY, HIP, TOTAL, ANTERIOR APPROACH (Left) Up with therapy Will need a few days in patient to be safe to discharge back to [facility] 08/29/2024, 9:44 AM

## 2024-08-30 NOTE — Progress Notes (Signed)
 Subjective: 2 Days Post-Op Procedures (LRB): ARTHROPLASTY, HIP, TOTAL, ANTERIOR APPROACH (Left) Patient reports pain as mild.  Eating drinking . No CP or SOB. Worried about  transportation to SNF.  Objective: Vital signs in last 24 hours: Temp:  [97.5 F (36.4 C)-98.2 F (36.8 C)] 97.9 F (36.6 C) (01/18 0944) Pulse Rate:  [72-89] 80 (01/18 0944) Resp:  [15-18] 16 (01/18 0944) BP: (126-135)/(65-78) 130/65 (01/18 0944) SpO2:  [96 %-98 %] 96 % (01/18 0944)  Intake/Output from previous day: 01/17 0701 - 01/18 0700 In: 1326.1 [P.O.:680; I.V.:646.1] Out: -  Intake/Output this shift: Total I/O In: 360 [P.O.:360] Out: -   Recent Labs    08/29/24 0327  HGB 11.3*   Recent Labs    08/29/24 0327  WBC 8.9  RBC 3.60*  HCT 32.5*  PLT 259   Recent Labs    08/29/24 0327  NA 139  K 4.0  CL 106  CO2 20*  BUN 15  CREATININE 0.69  GLUCOSE 198*  CALCIUM 8.3*   No results for input(s): LABPT, INR in the last 72 hours.  Dorsiflexion/Plantar flexion intact Incision: dressing C/D/I Compartment soft   Assessment/Plan: 2 Days Post-Op Procedures (LRB): ARTHROPLASTY, HIP, TOTAL, ANTERIOR APPROACH (Left) Up with therapy Discharge to SNF tomorrow     [facility] 08/30/2024, 11:50 AM

## 2024-08-30 NOTE — Plan of Care (Signed)
" °  Problem: Education: Goal: Knowledge of the prescribed therapeutic regimen will improve Outcome: Progressing   Problem: Cardiac: Goal: Ability to maintain an adequate cardiac output Outcome: Progressing Goal: Will show no evidence of cardiac arrhythmias Outcome: Progressing   Problem: Nutritional: Goal: Will attain and maintain optimal nutritional status Outcome: Progressing   Problem: Neurological: Goal: Will regain or maintain usual level of consciousness Outcome: Progressing   Problem: Clinical Measurements: Goal: Ability to maintain clinical measurements within normal limits Outcome: Progressing Goal: Postoperative complications will be avoided or minimized Outcome: Progressing   Problem: Respiratory: Goal: Will regain and/or maintain adequate ventilation Outcome: Progressing Goal: Respiratory status will improve Outcome: Progressing   Problem: Skin Integrity: Goal: Demonstrates signs of wound healing without infection Outcome: Progressing   Problem: Urinary Elimination: Goal: Will remain free from infection Outcome: Progressing Goal: Ability to achieve and maintain adequate urine output Outcome: Progressing   Problem: Education: Goal: Knowledge of General Education information will improve Description: Including pain rating scale, medication(s)/side effects and non-pharmacologic comfort measures Outcome: Progressing   Problem: Health Behavior/Discharge Planning: Goal: Ability to manage health-related needs will improve Outcome: Progressing   Problem: Clinical Measurements: Goal: Ability to maintain clinical measurements within normal limits will improve Outcome: Progressing Goal: Will remain free from infection Outcome: Progressing Goal: Diagnostic test results will improve Outcome: Progressing Goal: Respiratory complications will improve Outcome: Progressing Goal: Cardiovascular complication will be avoided Outcome: Progressing   Problem:  Activity: Goal: Risk for activity intolerance will decrease Outcome: Progressing   Problem: Nutrition: Goal: Adequate nutrition will be maintained Outcome: Progressing   Problem: Coping: Goal: Level of anxiety will decrease Outcome: Progressing   Problem: Elimination: Goal: Will not experience complications related to bowel motility Outcome: Progressing Goal: Will not experience complications related to urinary retention Outcome: Progressing   Problem: Pain Managment: Goal: General experience of comfort will improve and/or be controlled Outcome: Progressing   Problem: Safety: Goal: Ability to remain free from injury will improve Outcome: Progressing   Problem: Skin Integrity: Goal: Risk for impaired skin integrity will decrease Outcome: Progressing   Problem: Education: Goal: Knowledge of the prescribed therapeutic regimen will improve Outcome: Progressing Goal: Understanding of discharge needs will improve Outcome: Progressing Goal: Individualized Educational Video(s) Outcome: Progressing   Problem: Activity: Goal: Ability to avoid complications of mobility impairment will improve Outcome: Progressing Goal: Ability to tolerate increased activity will improve Outcome: Progressing   Problem: Clinical Measurements: Goal: Postoperative complications will be avoided or minimized Outcome: Progressing   Problem: Pain Management: Goal: Pain level will decrease with appropriate interventions Outcome: Progressing   Problem: Skin Integrity: Goal: Will show signs of wound healing Outcome: Progressing   "

## 2024-08-30 NOTE — Progress Notes (Signed)
 Physical Therapy Treatment Patient Details Name: [name] MRN: [id number] DOB: July 15, 1951 Today's Date: 08/30/2024   History of Present Illness Pt s/p L THR and with hx of breast CA    PT Comments  Pt continues cooperative and with encouragement willing to attempt ambulation in hall.  Pt assisted from bathroom after performing hand hygiene standing at sink and able to ambulate increased distance (53') but continues limited by c/o pain and fatigue with activity.  Pt up in recliner at session end with chair alarm on.    If plan is discharge home, recommend the following: A little help with walking and/or transfers;A little help with bathing/dressing/bathroom;Assistance with cooking/housework;Assist for transportation;Help with stairs or ramp for entrance   Can travel by private vehicle     Yes  Equipment Recommendations  None recommended by PT    Recommendations for Other Services       Precautions / Restrictions Precautions Precautions: Fall Restrictions Weight Bearing Restrictions Per Provider Order: No LLE Weight Bearing Per Provider Order: Weight bearing as tolerated     Mobility  Bed Mobility Overal bed mobility: Needs Assistance Bed Mobility: Supine to Sit     Supine to sit: Min assist     General bed mobility comments: Pt in bathroom and completed session up in recliner    Transfers Overall transfer level: Needs assistance Equipment used: Rolling walker (2 wheels) Transfers: Sit to/from Stand Sit to Stand: Supervision   Step pivot transfers: Contact guard assist       General transfer comment: Steady assist with cues for LE management and use of UEs to self assist.    Ambulation/Gait Ambulation/Gait assistance: Contact guard assist, Supervision Gait Distance (Feet): 53 Feet Assistive device: Rolling walker (2 wheels) Gait Pattern/deviations: Step-to pattern, Decreased step length - right, Decreased step length - left, Shuffle, Trunk  flexed Gait velocity: decr     General Gait Details: cues for sequence, posture, position from RW, safety awareness; distance ltd by c/o pain/fatigue   Stairs             Wheelchair Mobility     Tilt Bed    Modified Rankin (Stroke Patients Only)       Balance Overall balance assessment: Needs assistance Sitting-balance support: Feet supported, No upper extremity supported Sitting balance-Leahy Scale: Good     Standing balance support: No upper extremity supported Standing balance-Leahy Scale: Fair                              Hotel Manager: No apparent difficulties  Cognition Arousal: Alert Behavior During Therapy: Impulsive, Anxious   PT - Cognitive impairments: No apparent impairments                         Following commands: Intact      Cueing Cueing Techniques: Verbal cues  Exercises Total Joint Exercises Ankle Circles/Pumps: AROM, Both, Supine Quad Sets: AROM, Both, 10 reps, Supine Heel Slides: AAROM, Left, 20 reps, Supine Hip ABduction/ADduction: AAROM, Left, 15 reps, Supine    General Comments        Pertinent Vitals/Pain Pain Assessment Pain Assessment: 0-10 Pain Score: 5  Pain Location: L hip Pain Descriptors / Indicators: Aching, Burning, Grimacing, Guarding Pain Intervention(s): Limited activity within patient's tolerance, Monitored during session (ice pack declined)    Home Living  Prior Function            PT Goals (current goals can now be found in the care plan section) Acute Rehab PT Goals Patient Stated Goal: Regain IND PT Goal Formulation: With patient Time For Goal Achievement: 09/05/24 Potential to Achieve Goals: Good Progress towards PT goals: Progressing toward goals    Frequency    7X/week      PT Plan      Co-evaluation              AM-PAC PT 6 Clicks Mobility   Outcome Measure  Help needed turning from  your back to your side while in a flat bed without using bedrails?: A Little Help needed moving from lying on your back to sitting on the side of a flat bed without using bedrails?: A Little Help needed moving to and from a bed to a chair (including a wheelchair)?: A Little Help needed standing up from a chair using your arms (e.g., wheelchair or bedside chair)?: A Little Help needed to walk in hospital room?: A Little Help needed climbing 3-5 steps with a railing? : A Lot 6 Click Score: 17    End of Session Equipment Utilized During Treatment: Gait belt Activity Tolerance: Patient limited by pain;Patient tolerated treatment well;Patient limited by fatigue Patient left: in chair;with call bell/phone within reach;with chair alarm set Nurse Communication: Mobility status PT Visit Diagnosis: Difficulty in walking, not elsewhere classified (R26.2)     Time: 8484-8463 PT Time Calculation (min) (ACUTE ONLY): 21 min  Charges:    $Gait Training: 8-22 mins $Therapeutic Exercise: 8-22 mins PT General Charges $$ ACUTE PT VISIT: 1 Visit                     [facility] PT Acute Rehabilitation Services Office [phone]    [name] 08/30/2024, 4:25 PM

## 2024-08-30 NOTE — Progress Notes (Signed)
 Physical Therapy Treatment Patient Details Name: [name] MRN: [id number] DOB: 1950/10/05 Today's Date: 08/30/2024   History of Present Illness Pt s/p L THR and with hx of breast CA    PT Comments  Pt continues cooperative and therex program initiated.  Pt assisted up from bed and to bathroom for toileting.  Pt with noted decreased assist for bed mobility tasks.  Will follow up with ambulation in hall.    If plan is discharge home, recommend the following: A little help with walking and/or transfers;A little help with bathing/dressing/bathroom;Assistance with cooking/housework;Assist for transportation;Help with stairs or ramp for entrance   Can travel by private vehicle     Yes  Equipment Recommendations  None recommended by PT    Recommendations for Other Services       Precautions / Restrictions Precautions Precautions: Fall Restrictions Weight Bearing Restrictions Per Provider Order: No LLE Weight Bearing Per Provider Order: Weight bearing as tolerated     Mobility  Bed Mobility Overal bed mobility: Needs Assistance Bed Mobility: Supine to Sit     Supine to sit: Min assist     General bed mobility comments: cues for sequence and use of R LE to self assist    Transfers Overall transfer level: Needs assistance Equipment used: Rolling walker (2 wheels) Transfers: Sit to/from Stand Sit to Stand: Min assist           General transfer comment: Steady assist with cues for LE management and use of UEs to self assist.    Ambulation/Gait Ambulation/Gait assistance: Contact guard assist, Supervision Gait Distance (Feet): 15 Feet Assistive device: Rolling walker (2 wheels) Gait Pattern/deviations: Step-to pattern, Decreased step length - right, Decreased step length - left, Shuffle, Trunk flexed Gait velocity: decr     General Gait Details: cues for sequence, posture, position from RW, safety awareness; distance ltd by c/o pain   Stairs              Wheelchair Mobility     Tilt Bed    Modified Rankin (Stroke Patients Only)       Balance Overall balance assessment: Needs assistance Sitting-balance support: Feet supported, No upper extremity supported Sitting balance-Leahy Scale: Good     Standing balance support: Bilateral upper extremity supported Standing balance-Leahy Scale: Poor                              Communication Communication Communication: No apparent difficulties  Cognition Arousal: Alert Behavior During Therapy: Impulsive, Anxious   PT - Cognitive impairments: No apparent impairments                         Following commands: Intact      Cueing Cueing Techniques: Verbal cues  Exercises Total Joint Exercises Ankle Circles/Pumps: AROM, Both, Supine Quad Sets: AROM, Both, 10 reps, Supine Heel Slides: AAROM, Left, 20 reps, Supine Hip ABduction/ADduction: AAROM, Left, 15 reps, Supine    General Comments        Pertinent Vitals/Pain Pain Assessment Pain Assessment: 0-10 Pain Score: 5  Pain Location: L hip Pain Descriptors / Indicators: Aching, Burning, Grimacing, Guarding Pain Intervention(s): Limited activity within patient's tolerance, Monitored during session (pt declines ice pack)    Home Living                          Prior Function  PT Goals (current goals can now be found in the care plan section) Acute Rehab PT Goals Patient Stated Goal: Regain IND PT Goal Formulation: With patient Time For Goal Achievement: 09/05/24 Potential to Achieve Goals: Good Progress towards PT goals: Progressing toward goals    Frequency    7X/week      PT Plan      Co-evaluation              AM-PAC PT 6 Clicks Mobility   Outcome Measure  Help needed turning from your back to your side while in a flat bed without using bedrails?: A Little Help needed moving from lying on your back to sitting on the side of a flat bed without  using bedrails?: A Little Help needed moving to and from a bed to a chair (including a wheelchair)?: A Little Help needed standing up from a chair using your arms (e.g., wheelchair or bedside chair)?: A Little Help needed to walk in hospital room?: A Little Help needed climbing 3-5 steps with a railing? : A Lot 6 Click Score: 17    End of Session Equipment Utilized During Treatment: Gait belt Activity Tolerance: Patient limited by pain;Patient tolerated treatment well;Patient limited by fatigue Patient left: Other (comment) (bathroom) Nurse Communication: Mobility status PT Visit Diagnosis: Difficulty in walking, not elsewhere classified (R26.2)     Time: 8555-8496 PT Time Calculation (min) (ACUTE ONLY): 19 min  Charges:    $Therapeutic Exercise: 8-22 mins PT General Charges $$ ACUTE PT VISIT: 1 Visit                     [facility] PT Acute Rehabilitation Services Office [phone]    Janis Sol 08/30/2024, 4:18 PM

## 2024-08-31 ENCOUNTER — Encounter (HOSPITAL_COMMUNITY): Payer: Self-pay | Admitting: Orthopaedic Surgery

## 2024-08-31 MED ORDER — ASPIRIN 81 MG PO CHEW: 81.0000 mg | CHEWABLE_TABLET | Freq: Two times a day (BID) | ORAL | 0 refills | Status: DC

## 2024-08-31 MED ORDER — HYDROCODONE-ACETAMINOPHEN 5-325 MG PO TABS: 1.0000 | ORAL_TABLET | ORAL | 0 refills | Status: DC | PRN

## 2024-08-31 MED ORDER — METOCLOPRAMIDE HCL 5 MG/ML IJ SOLN
10.0000 mg | Freq: Once | INTRAMUSCULAR | Status: AC
  Filled 2024-08-31: qty 2

## 2024-08-31 MED ORDER — METHOCARBAMOL 500 MG PO TABS: 500.0000 mg | ORAL_TABLET | Freq: Four times a day (QID) | ORAL | 0 refills | Status: DC | PRN

## 2024-08-31 NOTE — NC FL2 (Signed)
 "   MEDICAID FL2 LEVEL OF CARE FORM     IDENTIFICATION  Patient Name: [name] Birthdate: [birth year] Sex: female Admission Date (Current Location): 08/28/2024  [facility] and [place] Number:  Producer, Television/film/video and Address:  [facility],  [address] [place], [place] [address]      Provider Number: [id number]  Attending Physician Name and Address:  [name],*  Relative Name and Phone Number:  spouse, [name] @ [phone]    Current Level of Care: Hospital Recommended Level of Care: Skilled Nursing Facility Prior Approval Number:    Date Approved/Denied:   PASRR Number: [id number]  Discharge Plan: SNF    Current Diagnoses: Patient Active Problem List   Diagnosis Date Noted   Status post total replacement of left hip 08/28/2024   Unilateral primary osteoarthritis, left hip 08/27/2024   Diverticulitis large intestine 05/25/2022   Abnormal CT scan, gallbladder 05/25/2022   Diffuse intraductal papillomatosis 09/21/2019   Nausea with vomiting 01/29/2019   Total bilirubin, elevated 01/27/2019   UTI symptoms 01/27/2019   Acute diverticulitis 01/26/2019   Papilloma of right breast 09/12/2018   Malignant neoplasm of upper-outer quadrant of left breast in female, estrogen receptor positive (HCC) 09/22/2017   Ductal carcinoma in situ (DCIS) of left breast 09/22/2017   Pain in the chest 11/12/2014   Essential hypertension 11/12/2014    Orientation RESPIRATION BLADDER Height & Weight     Self, Time, Situation, Place  Normal Continent Weight: 137 lb 12.6 oz (62.5 kg) Height:  5' 1 (154.9 cm)  BEHAVIORAL SYMPTOMS/MOOD NEUROLOGICAL BOWEL NUTRITION STATUS      Continent Diet (regular)  AMBULATORY STATUS COMMUNICATION OF NEEDS Skin   Limited Assist   Other (Comment) (surgical incision only)                       Personal Care Assistance Level of Assistance  Bathing, Dressing Bathing Assistance: Limited assistance    Dressing Assistance: Limited assistance     Functional Limitations Info  Sight, Hearing, Speech Sight Info: Adequate Hearing Info: Adequate Speech Info: Adequate    SPECIAL CARE FACTORS FREQUENCY  PT (By licensed PT), OT (By licensed OT)     PT Frequency: 5x/wk OT Frequency: 5x/wk            Contractures Contractures Info: Not present    Additional Factors Info  Code Status, Allergies Code Status Info: Full Allergies Info: Tetracycline, Candida Albicans, Cephalexin, Yeast (Saccharomyces Cerevisiae), Ciprofloxacin , Fenofibrate, Glycerine (Glycerin), Levofloxacin, Metronidazole , Tetracyclines & Related, Morphine            Current Medications (08/31/2024):  This is the current hospital active medication list Current Facility-Administered Medications  Medication Dose Route Frequency Provider Last Rate Last Admin   0.9 %  sodium chloride  infusion   Intravenous Continuous Lori Lonni GRADE, MD   Stopped at 08/29/24 1250   acetaminophen  (TYLENOL ) tablet 325-650 mg  325-650 mg Oral Q6H PRN Lori Lonni GRADE, MD   650 mg at 08/30/24 1741   alum & mag hydroxide-simeth (MAALOX/MYLANTA) 200-200-20 MG/5ML suspension 30 mL  30 mL Oral Q4H PRN Lori Lonni GRADE, MD   30 mL at 08/28/24 1425   amLODipine  (NORVASC ) tablet 2.5 mg  2.5 mg Oral QPC supper Lori Lonni GRADE, MD   2.5 mg at 08/30/24 1809   aspirin  chewable tablet 81 mg  81 mg Oral BID Lori Lonni GRADE, MD   81 mg at 08/31/24 0934   diphenhydrAMINE  (BENADRYL ) 12.5 MG/5ML  elixir 12.5-25 mg  12.5-25 mg Oral Q4H PRN Lori Lonni GRADE, MD       docusate sodium  (COLACE) capsule 100 mg  100 mg Oral BID Lori Lonni GRADE, MD   100 mg at 08/31/24 9065   irbesartan  (AVAPRO ) tablet 150 mg  150 mg Oral Daily Lori Lonni GRADE, MD   150 mg at 08/31/24 9065   And   hydrochlorothiazide  (HYDRODIURIL ) tablet 12.5 mg  12.5 mg Oral Daily Lori Lonni GRADE, MD   12.5 mg at 08/31/24 9065    HYDROcodone -acetaminophen  (NORCO) 7.5-325 MG per tablet 1-2 tablet  1-2 tablet Oral Q4H PRN Lori Lonni GRADE, MD   1 tablet at 08/29/24 1731   HYDROcodone -acetaminophen  (NORCO/VICODIN) 5-325 MG per tablet 1-2 tablet  1-2 tablet Oral Q4H PRN Lori Lonni GRADE, MD   1 tablet at 08/31/24 9063   HYDROmorphone  (DILAUDID ) injection 0.5-1 mg  0.5-1 mg Intravenous Q3H PRN Lori Lonni GRADE, MD       menthol  (CEPACOL) lozenge 3 mg  1 lozenge Oral PRN Lori Lonni GRADE, MD       Or   phenol (CHLORASEPTIC) mouth spray 1 spray  1 spray Mouth/Throat PRN Lori Lonni GRADE, MD       methocarbamol  (ROBAXIN ) tablet 500 mg  500 mg Oral Q6H PRN Lori Lonni GRADE, MD   500 mg at 08/30/24 2144   Or   methocarbamol  (ROBAXIN ) injection 500 mg  500 mg Intravenous Q6H PRN Lori Lonni GRADE, MD       metoCLOPramide  (REGLAN ) injection 10 mg  10 mg Intravenous Once Lori Lonni GRADE, MD       metoCLOPramide  (REGLAN ) tablet 5-10 mg  5-10 mg Oral Q8H PRN Lori Lonni GRADE, MD       Or   metoCLOPramide  (REGLAN ) injection 5-10 mg  5-10 mg Intravenous Q8H PRN Lori Lonni GRADE, MD   10 mg at 08/28/24 1959   ondansetron  (ZOFRAN ) tablet 4 mg  4 mg Oral Q6H PRN Lori Lonni GRADE, MD       Or   ondansetron  (ZOFRAN ) injection 4 mg  4 mg Intravenous Q6H PRN Lori Lonni GRADE, MD   4 mg at 08/28/24 1747   Oral care mouth rinse  15 mL Mouth Rinse PRN Lori Lonni GRADE, MD       pantoprazole  (PROTONIX ) EC tablet 40 mg  40 mg Oral Daily Lori Lonni GRADE, MD   40 mg at 08/31/24 0934   polyethylene glycol (MIRALAX  / GLYCOLAX ) packet 17 g  17 g Oral Daily PRN Lori Lonni GRADE, MD       zolpidem  (AMBIEN ) tablet 5 mg  5 mg Oral QHS PRN Lori Lonni GRADE, MD         Discharge Medications: Please see discharge summary for a list of discharge medications.  Relevant Imaging Results:  Relevant Lab Results:   Additional Information SS#  [id number]  Lori ASPEN, LCSW     "

## 2024-08-31 NOTE — TOC Progression Note (Addendum)
 Transition of Care [facility]) - Progression Note    Patient Details  Name: [name] MRN: [id number] Date of Birth: 1951-02-12  [facility] CM/SW Contact  NORMAN ASPEN, LCSW Phone Number: 08/31/2024, 10:20 AM  Clinical Narrative:     ADDENDUM: Have confirmed with [facility] that they can admit patient to their SNF area.  Started insurance authorization this morning, however, it remains pending decision still at this time.  Have alerted facility, MD and patient that will plan for admission tomorrow if auth received.  Received new IP CM referral to assist with SNF placement.  Confirmed with pt that she is hopeful to dc to the SNF rehab at [facility].  Have left VM for admissions coordinator, Ivey Minor, at [facility] and have started insurance authorization as well.      Barriers to Discharge: No Barriers Identified               Expected Discharge Plan and Services         Expected Discharge Date: 08/31/24               DME Arranged:  (RW- husband has one, but may not be correct size. May need Jr. walker)         HH Arranged: PT HH Agency:  [facility] will provide therapy)         Social Drivers of Health (SDOH) Interventions SDOH Screenings   Food Insecurity: No Food Insecurity (08/28/2024)  Housing: Low Risk (08/28/2024)  Transportation Needs: No Transportation Needs (08/28/2024)  Utilities: Not At Risk (08/28/2024)  Social Connections: Socially Integrated (08/28/2024)  Tobacco Use: Low Risk (08/28/2024)    Readmission Risk Interventions    08/29/2024   12:45 PM 05/28/2022    9:22 AM 05/27/2022   10:59 AM  Readmission Risk Prevention Plan  Post Dischage Appt Complete Complete Complete  Medication Screening Complete Complete Complete  Transportation Screening Complete Complete Complete

## 2024-08-31 NOTE — Plan of Care (Signed)
" °  Problem: Cardiac: Goal: Ability to maintain an adequate cardiac output Outcome: Progressing Goal: Will show no evidence of cardiac arrhythmias Outcome: Progressing   Problem: Clinical Measurements: Goal: Ability to maintain clinical measurements within normal limits Outcome: Progressing Goal: Postoperative complications will be avoided or minimized Outcome: Progressing   "

## 2024-08-31 NOTE — Discharge Summary (Signed)
 " Patient ID: [name] MRN: [id number] DOB/AGE: [birth year] 74 y.o.  Admit date: 08/28/2024 Discharge date: 09/01/2024  Admission Diagnoses:  Principal Problem:   Unilateral primary osteoarthritis, left hip Active Problems:   Status post total replacement of left hip   Discharge Diagnoses:  Same  Past Medical History:  Diagnosis Date   Abnormal liver function test    Arthritis    knees, hands & hips, back    Breast cancer (HCC) 2019   Left Breast Cancer   Cancer (HCC)    DCIS- L breast   Complication of anesthesia    Depression    post partum, also related to her in her 20's   Diverticulitis    Dyslipidemia    Esophageal spasm    rare occurence , /w ? reflux, no treatment or med. thus far   Headache    tension , sporadic    Hypertension    Hypertriglyceridemia    IBS (irritable bowel syndrome)    PONV (postoperative nausea and vomiting)    Pre-diabetes    Sleep disturbance     Surgeries: Procedures: ARTHROPLASTY, HIP, TOTAL, ANTERIOR APPROACH on 08/28/2024   Consultants:   Discharged Condition: Improved  Hospital Course: [name] is an 74 y.o. female who was admitted 08/28/2024 for operative treatment ofUnilateral primary osteoarthritis, left hip. Patient has severe unremitting pain that affects sleep, daily activities, and work/hobbies. After pre-op clearance the patient was taken to the operating room on 08/28/2024 and underwent  Procedures: ARTHROPLASTY, HIP, TOTAL, ANTERIOR APPROACH.    Patient was given perioperative antibiotics:  Anti-infectives (From admission, onward)    Start     Dose/Rate Route Frequency Ordered Stop   08/28/24 2000  vancomycin  (VANCOCIN ) IVPB 1000 mg/200 mL premix        1,000 mg 200 mL/hr over 60 Minutes Intravenous Every 12 hours 08/28/24 1258 08/28/24 2136   08/28/24 0630  vancomycin  (VANCOCIN ) IVPB 1000 mg/200 mL premix        1,000 mg 200 mL/hr over 60 Minutes Intravenous On call to O.R. 08/28/24  0629 08/28/24 1300   08/28/24 0600  gentamicin  (GARAMYCIN ) 300 mg in dextrose  5 % 100 mL IVPB        5 mg/kg  59.9 kg 107.5 mL/hr over 60 Minutes Intravenous On call to O.R. 08/27/24 0801 08/28/24 1300        Patient was given sequential compression devices, early ambulation, and chemoprophylaxis to prevent DVT.  Inpatient Morphine  Milligram Equivalents Per Day 1/16 - 1/19   Values displayed are in units of MME/Day    Order Start / End Date 1/16 1/17 Yesterday Today    oxyCODONE  (Oxy IR/ROXICODONE ) immediate release tablet 5 mg 1/16 - 1/16 0 of Unknown -- -- --    oxyCODONE  (ROXICODONE ) 5 MG/5ML solution 5 mg 1/16 - 1/16 0 of Unknown -- -- --      Group total: 0 of Unknown       fentaNYL  (SUBLIMAZE ) injection 25-50 mcg 1/16 - 1/16 0 of 45-90 -- -- --    fentaNYL  (SUBLIMAZE ) injection 1/16 - 1/16 *30 of 30 -- -- --    HYDROcodone -acetaminophen  (NORCO/VICODIN) 5-325 MG per tablet 1-2 tablet 1/16 - No end date 20 of 15-30 10 of 30-60 10 of 30-60 0 of 30-60    HYDROcodone -acetaminophen  (NORCO) 7.5-325 MG per tablet 1-2 tablet 1/16 - No end date 0 of 22.5-45 15 of 45-90 0 of 45-90 0 of 45-90    HYDROmorphone  (DILAUDID ) injection 0.5-1 mg  1/16 - No end date 0 of 40-80 0 of 80-160 0 of 80-160 0 of 80-160    Daily Totals  * 50 of Unknown (at least 152.5-275) 25 of 155-310 10 of 155-310 0 of 155-310  *One-Step medication  Calculation Errors     Order Type Date Details   oxyCODONE  (Oxy IR/ROXICODONE ) immediate release tablet 5 mg Ordered Dose -- Insufficient frequency information   oxyCODONE  (ROXICODONE ) 5 MG/5ML solution 5 mg Ordered Dose -- Insufficient frequency information            Patient benefited maximally from hospital stay and there were no complications.    Recent vital signs: Patient Vitals for the past 24 hrs:  BP Temp Temp src Pulse Resp SpO2  08/31/24 0536 (!) 140/80 98.4 F (36.9 C) -- 74 16 98 %  08/30/24 2041 119/68 98.2 F (36.8 C) Oral 76 18 96 %  08/30/24 1745  130/73 98 F (36.7 C) -- 80 16 96 %  08/30/24 0944 130/65 97.9 F (36.6 C) -- 80 16 96 %     Recent laboratory studies:  Recent Labs    08/29/24 0327  WBC 8.9  HGB 11.3*  HCT 32.5*  PLT 259  NA 139  K 4.0  CL 106  CO2 20*  BUN 15  CREATININE 0.69  GLUCOSE 198*  CALCIUM 8.3*     Discharge Medications:   Allergies as of 08/31/2024       Reactions   Tetracycline Nausea And Vomiting, Other (See Comments)   Abdominal pain   Candida Albicans Nausea And Vomiting, Other (See Comments)   Abdominal pain   Cephalexin Nausea And Vomiting, Other (See Comments)   Abdominal pain   Yeast (saccharomyces Cerevisiae) Nausea And Vomiting, Other (See Comments)   Abdominal pain   Ciprofloxacin  Nausea And Vomiting   Fenofibrate Other (See Comments)   Unknown reaction   Glycerine [glycerin] Nausea And Vomiting   Levofloxacin Other (See Comments)   Destroyed her gut bacteria   Metronidazole  Nausea And Vomiting   Tetracyclines & Related Nausea And Vomiting   Morphine  Nausea And Vomiting        Medication List     TAKE these medications    Aleve  220 MG tablet Generic drug: naproxen  sodium Take 220-440 mg by mouth 2 (two) times daily as needed (for pain).   amLODipine  2.5 MG tablet Commonly known as: NORVASC  Take 2.5 mg by mouth daily after supper.   aspirin  81 MG chewable tablet Chew 1 tablet (81 mg total) by mouth 2 (two) times daily.   Excedrin Tension Headache 500-65 MG Tabs per tablet Generic drug: acetaminophen -caffeine Take 1 tablet by mouth every 6 (six) hours as needed (for headaches).   Glucosamine-Chondroitin-MSM 500-400-125 MG Tabs Take 2 tablets by mouth daily.   HYDROcodone -acetaminophen  5-325 MG tablet Commonly known as: NORCO/VICODIN Take 1-2 tablets by mouth every 4 (four) hours as needed for moderate pain (pain score 4-6).   irbesartan -hydrochlorothiazide  150-12.5 MG tablet Commonly known as: AVALIDE Take 1 tablet by mouth daily.   meloxicam  15  MG tablet Commonly known as: MOBIC  Take 1 tablet (15 mg total) by mouth daily.   methocarbamol  500 MG tablet Commonly known as: ROBAXIN  Take 1 tablet (500 mg total) by mouth every 6 (six) hours as needed for muscle spasms.   Multivitamin Women 50+ Tabs Take 1 tablet by mouth daily with breakfast.   Phazyme 180 MG Caps Generic drug: Simethicone  Take 180-360 mg by mouth daily as needed (for gas).  polyethylene glycol 17 g packet Commonly known as: MIRALAX  / GLYCOLAX  Take 17 g by mouth daily as needed for moderate constipation or mild constipation.   PROBIOTIC DAILY PO Take 1 capsule by mouth daily.   Shingrix injection Generic drug: Zoster Vaccine Adjuvanted Inject 0.5 mLs into the muscle once.   Voltaren  1 % Gel Generic drug: diclofenac  sodium Apply 2 g topically 4 (four) times daily.   zaleplon 10 MG capsule Commonly known as: SONATA Take 10 mg by mouth at bedtime as needed for sleep.               Durable Medical Equipment  (From admission, onward)           Start     Ordered   08/28/24 1259  DME 3 n 1  Once        08/28/24 1258   08/28/24 1259  DME Walker rolling  Once       Question Answer Comment  Walker: With 5 Inch Wheels   Patient needs a walker to treat with the following condition Status post total replacement of left hip      08/28/24 1258            Diagnostic Studies: DG Pelvis Portable Result Date: 08/28/2024 CLINICAL DATA:  Status post hip replacement. EXAM: PORTABLE PELVIS 1-2 VIEWS COMPARISON:  Preoperative imaging FINDINGS: Left hip arthroplasty in expected alignment. No periprosthetic lucency or fracture. Recent postsurgical change includes air and edema in the soft tissues. Overlying skin staples in place. IMPRESSION: Left hip arthroplasty without immediate postoperative complication. Electronically Signed   By: Andrea Gasman M.D.   On: 08/28/2024 11:49   DG HIP UNILAT WITH PELVIS 1V LEFT Result Date: 08/28/2024 CLINICAL  DATA:  Elective surgery. EXAM: DG HIP (WITH OR WITHOUT PELVIS) 1V*L* COMPARISON:  Preoperative imaging FINDINGS: Eight fluoroscopic spot views of the pelvis and left hip obtained in the operating room. Sequential images during hip arthroplasty. Fluoroscopy time 18 seconds. Dose 1.5 mGy. IMPRESSION: Intraoperative fluoroscopy during left hip arthroplasty. Electronically Signed   By: Andrea Gasman M.D.   On: 08/28/2024 11:49   DG C-Arm 1-60 Min-No Report Result Date: 08/28/2024 Fluoroscopy was utilized by the requesting physician.  No radiographic interpretation.   CT ABDOMEN PELVIS W CONTRAST Result Date: 08/03/2024 EXAM: CT ABDOMEN AND PELVIS WITH CONTRAST 08/03/2024 10:56:39 PM TECHNIQUE: CT of the abdomen and pelvis was performed with the administration of 85 mL of iohexol  (OMNIPAQUE ) 300 MG/ML solution. Multiplanar reformatted images are provided for review. Automated exposure control, iterative reconstruction, and/or weight-based adjustment of the mA/kV was utilized to reduce the radiation dose to as low as reasonably achievable. COMPARISON: 05/28/2022 CLINICAL HISTORY: Abdominal pain, acute, nonlocalized. FINDINGS: LOWER CHEST: Small hiatal hernia. LIVER: Hepatic steatosis. GALLBLADDER AND BILE DUCTS: Gallbladder is unremarkable. No biliary ductal dilatation. SPLEEN: No acute abnormality. PANCREAS: No acute abnormality. ADRENAL GLANDS: No acute abnormality. KIDNEYS, URETERS AND BLADDER: Nonobstructing bilateral nephrolithiasis. No hydronephrosis. No perinephric or periureteral stranding. Urinary bladder is unremarkable. GI AND BOWEL: Extensive descending and sigmoid colon diverticulosis. Trace stranding about the distal descending colon suggesting mild diverticulitis. Large fecal load in the colon compatible with constipation. Stomach demonstrates no acute abnormality. There is no bowel obstruction. PERITONEUM AND RETROPERITONEUM: No ascites. No free air. VASCULATURE: Aorta is normal in caliber. LYMPH  NODES: No lymphadenopathy. REPRODUCTIVE ORGANS: No acute abnormality. BONES AND SOFT TISSUES: Advanced arthritis left hip. No acute osseous abnormality. No focal soft tissue abnormality. IMPRESSION: 1. Mild descending colon diverticulitis.  2. Large fecal load in the colon compatible with constipation. Electronically signed by: Norman Gatlin MD 08/03/2024 11:06 PM EST RP Workstation: [id number]    Disposition: Discharge disposition: 03-Skilled Nursing Facility          Follow-up Information     Vernetta Lonni GRADE, MD. Go on 09/10/2024.   Specialty: Orthopedic Surgery Why: at 1:30 pm for your first in office appointment after your surgery with Dr. Vernetta Pass information: [address] [place] [address] [phone]                  Signed: Lonni GRADE Vernetta 08/31/2024, 7:34 AM    "

## 2024-08-31 NOTE — Plan of Care (Signed)
" °  Problem: Education: Goal: Knowledge of the prescribed therapeutic regimen will improve Outcome: Progressing   Problem: Cardiac: Goal: Ability to maintain an adequate cardiac output Outcome: Progressing Goal: Will show no evidence of cardiac arrhythmias Outcome: Progressing   Problem: Nutritional: Goal: Will attain and maintain optimal nutritional status Outcome: Progressing   Problem: Neurological: Goal: Will regain or maintain usual level of consciousness Outcome: Progressing   Problem: Clinical Measurements: Goal: Ability to maintain clinical measurements within normal limits Outcome: Progressing Goal: Postoperative complications will be avoided or minimized Outcome: Progressing   Problem: Respiratory: Goal: Will regain and/or maintain adequate ventilation Outcome: Progressing Goal: Respiratory status will improve Outcome: Progressing   Problem: Skin Integrity: Goal: Demonstrates signs of wound healing without infection Outcome: Progressing   Problem: Urinary Elimination: Goal: Will remain free from infection Outcome: Progressing Goal: Ability to achieve and maintain adequate urine output Outcome: Progressing   Problem: Education: Goal: Knowledge of General Education information will improve Description: Including pain rating scale, medication(s)/side effects and non-pharmacologic comfort measures Outcome: Progressing   Problem: Health Behavior/Discharge Planning: Goal: Ability to manage health-related needs will improve Outcome: Progressing   Problem: Clinical Measurements: Goal: Ability to maintain clinical measurements within normal limits will improve Outcome: Progressing Goal: Will remain free from infection Outcome: Progressing Goal: Diagnostic test results will improve Outcome: Progressing Goal: Respiratory complications will improve Outcome: Progressing Goal: Cardiovascular complication will be avoided Outcome: Progressing   Problem:  Activity: Goal: Risk for activity intolerance will decrease Outcome: Progressing   Problem: Nutrition: Goal: Adequate nutrition will be maintained Outcome: Progressing   Problem: Coping: Goal: Level of anxiety will decrease Outcome: Progressing   Problem: Elimination: Goal: Will not experience complications related to bowel motility Outcome: Progressing Goal: Will not experience complications related to urinary retention Outcome: Progressing   Problem: Pain Managment: Goal: General experience of comfort will improve and/or be controlled Outcome: Progressing   Problem: Safety: Goal: Ability to remain free from injury will improve Outcome: Progressing   Problem: Skin Integrity: Goal: Risk for impaired skin integrity will decrease Outcome: Progressing   Problem: Education: Goal: Knowledge of the prescribed therapeutic regimen will improve Outcome: Progressing Goal: Understanding of discharge needs will improve Outcome: Progressing Goal: Individualized Educational Video(s) Outcome: Progressing   Problem: Clinical Measurements: Goal: Postoperative complications will be avoided or minimized Outcome: Progressing   Problem: Pain Management: Goal: Pain level will decrease with appropriate interventions Outcome: Progressing   Problem: Skin Integrity: Goal: Will show signs of wound healing Outcome: Progressing   "

## 2024-08-31 NOTE — Progress Notes (Signed)
 Patient ID: [name], female   DOB: [birth year], 74 y.o.   MRN: [id number] The patient is awake and alert.  Her left operative hip is stable and she is doing better overall.  Her vital signs are stable.  Her and her husband do state [facility].  She should be able to be discharged today to their skilled nursing section.  We will put in for discharge today.  Her left operative hip dressing is clean and dry.

## 2024-09-01 MED ORDER — HYDROCODONE-ACETAMINOPHEN 5-325 MG PO TABS: 1.0000 | ORAL_TABLET | ORAL | 0 refills | Status: AC | PRN

## 2024-09-01 MED ORDER — METHOCARBAMOL 500 MG PO TABS: 500.0000 mg | ORAL_TABLET | Freq: Four times a day (QID) | ORAL | 0 refills | Status: AC | PRN

## 2024-09-01 MED ORDER — ASPIRIN 81 MG PO CHEW: 81.0000 mg | CHEWABLE_TABLET | Freq: Two times a day (BID) | ORAL | 0 refills | Status: AC

## 2024-09-01 NOTE — Care Management Important Message (Signed)
 Important Message  Patient Details IM Letter given. Name: Stephanye Finnicum MRN: [id number] Date of Birth: [birth year]   Important Message Given:  Yes - Medicare IM     Melba Ates 09/01/2024, 2:33 PM

## 2024-09-01 NOTE — Progress Notes (Signed)
 Patient ID: [name], female   DOB: 1951/08/04, 74 y.o.   MRN: [id number]  The plan is to discharge the patient to short-term skilled nursing at [facility] as soon as insurance authorization is obtained.  She can go today if that happens.  The discharge summary has been updated.

## 2024-09-01 NOTE — Progress Notes (Signed)
 Physical Therapy Treatment Patient Details Name: [name] MRN: [id number] DOB: 1951-02-17 Today's Date: 09/01/2024   History of Present Illness Pt s/p L THR and with hx of breast CA    PT Comments  Pt continues cooperative but increased fatigue from difficult night.  Pt up to bathroom for toileting with hand hygiene standing at sink, ambulated limited distance in hall and performed therex program with assist.  Patient will benefit from continued inpatient follow up therapy, <3 hours/day to maximize IND and safety prior to return home with limited assist - spouse is on RW.       If plan is discharge home, recommend the following: A little help with walking and/or transfers;A little help with bathing/dressing/bathroom;Assistance with cooking/housework;Assist for transportation;Help with stairs or ramp for entrance   Can travel by private vehicle     Yes  Equipment Recommendations  None recommended by PT    Recommendations for Other Services       Precautions / Restrictions Precautions Precautions: Fall Restrictions Weight Bearing Restrictions Per Provider Order: No LLE Weight Bearing Per Provider Order: Weight bearing as tolerated     Mobility  Bed Mobility Overal bed mobility: Needs Assistance Bed Mobility: Sit to Supine       Sit to supine: Contact guard assist   General bed mobility comments: Increased time, cues for sequence, CGA for safety    Transfers Overall transfer level: Needs assistance Equipment used: Rolling walker (2 wheels) Transfers: Sit to/from Stand Sit to Stand: Supervision   Step pivot transfers: Contact guard assist       General transfer comment: Steady assist with cues for LE management and use of UEs to self assist.    Ambulation/Gait Ambulation/Gait assistance: Contact guard assist, Supervision Gait Distance (Feet): 62 Feet (and 15' into bathroom) Assistive device: Rolling walker (2 wheels) Gait Pattern/deviations:  Step-to pattern, Decreased step length - right, Decreased step length - left, Shuffle, Trunk flexed Gait velocity: decr     General Gait Details: min cues for sequence, posture, position from RW, safety awareness; distance ltd by c/o pain/fatigue   Stairs             Wheelchair Mobility     Tilt Bed    Modified Rankin (Stroke Patients Only)       Balance Overall balance assessment: Needs assistance Sitting-balance support: Feet supported, No upper extremity supported Sitting balance-Leahy Scale: Good     Standing balance support: No upper extremity supported Standing balance-Leahy Scale: Fair                              Hotel Manager: No apparent difficulties  Cognition Arousal: Alert Behavior During Therapy: Impulsive, Anxious   PT - Cognitive impairments: No apparent impairments                         Following commands: Intact      Cueing Cueing Techniques: Verbal cues  Exercises Total Joint Exercises Ankle Circles/Pumps: AROM, Both, Supine Quad Sets: AROM, Both, 10 reps, Supine Heel Slides: AAROM, Left, 20 reps, Supine Hip ABduction/ADduction: AAROM, Left, 15 reps, Supine    General Comments        Pertinent Vitals/Pain Pain Assessment Pain Assessment: 0-10 Pain Score: 5  Pain Location: L hip Pain Descriptors / Indicators: Aching, Burning, Grimacing, Guarding Pain Intervention(s): Limited activity within patient's tolerance, Monitored during session, Premedicated before session  Home Living                          Prior Function            PT Goals (current goals can now be found in the care plan section) Acute Rehab PT Goals Patient Stated Goal: Regain IND PT Goal Formulation: With patient Time For Goal Achievement: 09/05/24 Potential to Achieve Goals: Good Progress towards PT goals: Progressing toward goals    Frequency    7X/week      PT Plan       Co-evaluation              AM-PAC PT 6 Clicks Mobility   Outcome Measure  Help needed turning from your back to your side while in a flat bed without using bedrails?: A Little Help needed moving from lying on your back to sitting on the side of a flat bed without using bedrails?: A Little Help needed moving to and from a bed to a chair (including a wheelchair)?: A Little Help needed standing up from a chair using your arms (e.g., wheelchair or bedside chair)?: A Little Help needed to walk in hospital room?: A Little Help needed climbing 3-5 steps with a railing? : A Little 6 Click Score: 18    End of Session Equipment Utilized During Treatment: Gait belt Activity Tolerance: Patient tolerated treatment well;Patient limited by fatigue Patient left: in bed;with call bell/phone within reach;with bed alarm set Nurse Communication: Mobility status PT Visit Diagnosis: Difficulty in walking, not elsewhere classified (R26.2)     Time: 8849-8785 PT Time Calculation (min) (ACUTE ONLY): 24 min  Charges:    $Gait Training: 8-22 mins $Therapeutic Exercise: 8-22 mins PT General Charges $$ ACUTE PT VISIT: 1 Visit                     [facility] PT Acute Rehabilitation Services Office [phone]    Domenick Quebedeaux 09/01/2024, 12:45 PM

## 2024-09-01 NOTE — Progress Notes (Signed)
 Patient ID: [name], female   DOB: 17-Sep-1950, 74 y.o.   MRN: [id number] The patient is awake and alert and currently comfortable.  She understands that skilled nursing has been denied by her insurance company.  The plan is to have her home tomorrow with home health PT.  She understands this as well.  Her vital signs are stable and her left operative hip is stable.

## 2024-09-01 NOTE — Progress Notes (Signed)
 Patient woke up in pain and went to give pain medication with explaining that, if she could wait until 0322 I would be able to give the muscle relaxer with pain medication that worked really well for her earlier. Patient begin yelling and cursing. Pain medication was given and will reassess.

## 2024-09-01 NOTE — Plan of Care (Signed)
" °  Problem: Clinical Measurements: Goal: Ability to maintain clinical measurements within normal limits Outcome: Progressing Goal: Postoperative complications will be avoided or minimized Outcome: Progressing   Problem: Respiratory: Goal: Will regain and/or maintain adequate ventilation Outcome: Progressing   "

## 2024-09-01 NOTE — TOC Progression Note (Addendum)
 Transition of Care [facility]) - Progression Note    Patient Details  Name: [name] MRN: [id number] Date of Birth: 18-Mar-1951  [facility]) CM/SW Contact  NORMAN ASPEN, LCSW Phone Number: 09/01/2024, 11:57 AM  Clinical Narrative:     ADDENDUM: Insurance has upheld denial for SNF following peer to peer and have updated MD/RN and patient.  Reviewed options with pt which include privately paying for respite care bed at [facility] vs. Returning to [place].  Pt has spoken with spouse and they have decided for [place] with [place] and HHPT follow up.  Pt states, if it doesn't work out then [employer] go to the respite bed.   Pt would, also, like to have [facility] [address] provide the dc transportation home. Have left VM for the RN covering the [place] apts - [place] [phone] ext. [phone]) to review plans and confirm pt's return.  Will need to coordinate transportation for tomorrow as well.  Insurance has offered peer to peer review.  Have provided Dr. Vernetta with information:  call will need to be made by 3pm today.  Have alerted patient and [facility] as well that we are still awaiting decision on authorization.    Barriers to Discharge: No Barriers Identified               Expected Discharge Plan and Services         Expected Discharge Date: 09/01/24               DME Arranged:  (RW- husband has one, but may not be correct size. May need Jr. walker)         HH Arranged: PT HH Agency:  [facility] will provide therapy)         Social Drivers of Health (SDOH) Interventions SDOH Screenings   Food Insecurity: No Food Insecurity (08/28/2024)  Housing: Low Risk (08/28/2024)  Transportation Needs: No Transportation Needs (08/28/2024)  Utilities: Not At Risk (08/28/2024)  Social Connections: Socially Integrated (08/28/2024)  Tobacco Use: Low Risk (08/28/2024)    Readmission Risk Interventions    08/29/2024   12:45  PM 05/28/2022    9:22 AM 05/27/2022   10:59 AM  Readmission Risk Prevention Plan  Post Dischage Appt Complete Complete Complete  Medication Screening Complete Complete Complete  Transportation Screening Complete Complete Complete

## 2024-09-02 NOTE — Progress Notes (Signed)
 Patient ID: [name], female   DOB: 1951/06/14, 74 y.o.   MRN: [id number] No acute changes.  The patient can be discharged today.

## 2024-09-02 NOTE — Progress Notes (Signed)
 Physical Therapy Treatment Patient Details Name: [name] MRN: [id number] DOB: 1951/06/07 Today's Date: 09/02/2024   History of Present Illness Pt s/p L THR and with hx of breast CA    PT Comments  POD # 5 am session PT - Cognition Comments: Pt AxO x 3 pleasant and motivated.  Resides at [facility] and plans to return with [facility] PT.  Assisted with amb in hallway.  Then returned to room to perform some TE's following HEP handout.  Instructed on proper tech, freq as well as use of ICE.   Addressed all mobility questions, discussed appropriate activity, educated on use of ICE.  Pt ready for D/C to home at [facility] with [facility] PT as SNF was declined by [employer]  .    If plan is discharge home, recommend the following: A little help with walking and/or transfers;A little help with bathing/dressing/bathroom;Assistance with cooking/housework;Assist for transportation;Help with stairs or ramp for entrance   Can travel by private vehicle        Equipment Recommendations  None recommended by PT    Recommendations for Other Services       Precautions / Restrictions Precautions Precautions: Fall Restrictions Weight Bearing Restrictions Per Provider Order: No LLE Weight Bearing Per Provider Order: Weight bearing as tolerated     Mobility  Bed Mobility               General bed mobility comments: OOB in recliner    Transfers Overall transfer level: Needs assistance Equipment used: Rolling walker (2 wheels) Transfers: Sit to/from Stand Sit to Stand: Supervision   Step pivot transfers: Contact guard assist       General transfer comment: min VC's on safety with turns using walker    Ambulation/Gait Ambulation/Gait assistance: Contact guard assist, Supervision Gait Distance (Feet): 75 Feet Assistive device: Rolling walker (2 wheels) Gait Pattern/deviations: Step-to pattern, Decreased step length - right, Decreased step length - left,  Shuffle, Trunk flexed       General Gait Details: tolerated a functional distance   Stairs             Wheelchair Mobility     Tilt Bed    Modified Rankin (Stroke Patients Only)       Balance                                            Communication Communication Communication: No apparent difficulties  Cognition Arousal: Alert Behavior During Therapy: Impulsive, Anxious   PT - Cognitive impairments: No apparent impairments                       PT - Cognition Comments: Pt AxO x 3 pleasant and motivated.  Resides at [facility] and plans to return with [facility] PT. Following commands: Intact      Cueing Cueing Techniques: Verbal cues  Exercises      General Comments        Pertinent Vitals/Pain Pain Assessment Pain Assessment: 0-10 Pain Location: L hip Pain Descriptors / Indicators: Aching, Burning, Grimacing, Guarding    Home Living                          Prior Function            PT Goals (current goals  can now be found in the care plan section) Progress towards PT goals: Progressing toward goals    Frequency    7X/week      PT Plan      Co-evaluation              AM-PAC PT 6 Clicks Mobility   Outcome Measure  Help needed turning from your back to your side while in a flat bed without using bedrails?: A Little Help needed moving from lying on your back to sitting on the side of a flat bed without using bedrails?: A Little Help needed moving to and from a bed to a chair (including a wheelchair)?: A Little Help needed standing up from a chair using your arms (e.g., wheelchair or bedside chair)?: A Little Help needed to walk in hospital room?: A Little Help needed climbing 3-5 steps with a railing? : A Little 6 Click Score: 18    End of Session Equipment Utilized During Treatment: Gait belt Activity Tolerance: Patient tolerated treatment well;Patient limited by fatigue Patient left:  in chair;with call bell/phone within reach Nurse Communication: Mobility status PT Visit Diagnosis: Difficulty in walking, not elsewhere classified (R26.2)     Time: 8880-8856 PT Time Calculation (min) (ACUTE ONLY): 24 min  Charges:    $Gait Training: 8-22 mins $Therapeutic Exercise: 8-22 mins PT General Charges $$ ACUTE PT VISIT: 1 Visit                    Katheryn Leap  PTA Acute  Rehabilitation Services Office M-F          [phone]

## 2024-09-02 NOTE — Progress Notes (Signed)
Discharge instructions discussed with patient, verbalized agreement ?

## 2024-09-02 NOTE — TOC Progression Note (Signed)
 Transition of Care [facility]) - Progression Note    Patient Details  Name: [name] MRN: [id number] Date of Birth: 30-Jan-1951  [facility]) CM/SW Contact  Heather DELENA Saltness, LCSW Phone Number: 09/02/2024, 10:18 AM  Clinical Narrative:    CSW received voicemail from Lakrista [phone] at [facility] to discuss discharge planning. Return phone call made, no answer, voicemail left requesting return phone call. TOC will continue to follow.     Barriers to Discharge: No Barriers Identified   Expected Discharge Plan and Services Home   Expected Discharge Date: 09/02/24               DME Arranged:  (RW- husband has one, but may not be correct size. May need Jr. walker)         HH Arranged: PT HH Agency:  [facility] will provide therapy)         Social Drivers of Health (SDOH) Interventions SDOH Screenings   Food Insecurity: No Food Insecurity (08/28/2024)  Housing: Low Risk (08/28/2024)  Transportation Needs: No Transportation Needs (08/28/2024)  Utilities: Not At Risk (08/28/2024)  Social Connections: Socially Integrated (08/28/2024)  Tobacco Use: Low Risk (08/28/2024)    Readmission Risk Interventions    08/29/2024   12:45 PM 05/28/2022    9:22 AM 05/27/2022   10:59 AM  Readmission Risk Prevention Plan  Post Dischage Appt Complete Complete Complete  Medication Screening Complete Complete Complete  Transportation Screening Complete Complete Complete    Signed: Heather Saltness, MSW, LCSW Clinical Social Worker Inpatient Care Management 09/02/2024 10:21 AM

## 2024-09-02 NOTE — TOC Transition Note (Addendum)
 Transition of Care [facility]) - Discharge Note   Patient Details  Name: [name] MRN: [id number] Date of Birth: May 17, 1951  Transition of Care [facility]) CM/SW Contact:  Heather DELENA Saltness, LCSW Phone Number: 09/02/2024, 11:09 AM   Clinical Narrative:    Pt discharging [place] [facility] [place] apartment today. Pt will receive HH PT services through [facility]. HH orders faxed to [phone]. Pt has RW at home. CSW spoke with Corita [phone] at [facility], who reports facility will transport pt home upon discharge. Corita advised to have pt call her when ready to discharge. CSW gave pt Lakrista's phone number and instructions for discharge transportation. Pt in agreement with discharge plan.    Final next level of care: Home w Home Health Services Barriers to Discharge: No Barriers Identified   Patient Goals and CMS Choice Patient states their goals for this hospitalization and ongoing recovery are:: return home        Discharge Placement  [facility]              Patient to be transferred to facility by: [facility] staff Name of family member notified: Patient Patient and family notified of of transfer: 09/02/24  Discharge Plan and Services Additional resources added to the After Visit Summary for  Follow Up                DME Arranged:  (RW- husband has one, but may not be correct size. May need Jr. walker)         HH Arranged: PT HH Agency:  [facility] will provide therapy)        Social Drivers of Health (SDOH) Interventions SDOH Screenings   Food Insecurity: No Food Insecurity (08/28/2024)  Housing: Low Risk (08/28/2024)  Transportation Needs: No Transportation Needs (08/28/2024)  Utilities: Not At Risk (08/28/2024)  Social Connections: Socially Integrated (08/28/2024)  Tobacco Use: Low Risk (08/28/2024)     Readmission Risk Interventions    08/29/2024   12:45 PM 05/28/2022    9:22 AM 05/27/2022   10:59 AM   Readmission Risk Prevention Plan  Post Dischage Appt Complete Complete Complete  Medication Screening Complete Complete Complete  Transportation Screening Complete Complete Complete     Signed: Heather Saltness, MSW, LCSW Clinical Social Worker Inpatient Care Management 09/02/2024 11:15 AM

## 2024-09-02 NOTE — NC FL2 (Signed)
 " [place]  MEDICAID FL2 LEVEL OF CARE FORM     IDENTIFICATION  Patient Name: [name] Birthdate: 06-18-1951 Sex: female Admission Date (Current Location): 08/28/2024  [facility] and [place] Number:  Producer, Television/film/video and Address:  [facility],  [address] [place], [place] [address]      Provider Number: [id number]  Attending Physician Name and Address:  Vernetta Lonni GRADE,*  Relative Name and Phone Number:  [place], [facility]), Emergency Contact  [phone]    Current Level of Care: Hospital Recommended Level of Care: Nursing Facility Prior Approval Number:    Date Approved/Denied:   PASRR Number: [id number]  Discharge Plan: Other (Comment) (Respite)    Current Diagnoses: Patient Active Problem List   Diagnosis Date Noted   Status post total replacement of left hip 08/28/2024   Unilateral primary osteoarthritis, left hip 08/27/2024   Diverticulitis large intestine 05/25/2022   Abnormal CT scan, gallbladder 05/25/2022   Diffuse intraductal papillomatosis 09/21/2019   Nausea with vomiting 01/29/2019   Total bilirubin, elevated 01/27/2019   UTI symptoms 01/27/2019   Acute diverticulitis 01/26/2019   Papilloma of right breast 09/12/2018   Malignant neoplasm of upper-outer quadrant of left breast in female, estrogen receptor positive (HCC) 09/22/2017   Ductal carcinoma in situ (DCIS) of left breast 09/22/2017   Pain in the chest 11/12/2014   Essential hypertension 11/12/2014    Orientation RESPIRATION BLADDER Height & Weight     Self, Time, Situation, Place  Normal Continent Weight: 137 lb 12.6 oz (62.5 kg) Height:  5' 1 (154.9 cm)  BEHAVIORAL SYMPTOMS/MOOD NEUROLOGICAL BOWEL NUTRITION STATUS      Continent Diet (Regular)  AMBULATORY STATUS COMMUNICATION OF NEEDS Skin   Supervision Verbally Other (Comment) (Surgical Closed Surgical Incision Hip)                       Personal Care Assistance Level of Assistance   Bathing, Dressing Bathing Assistance: Limited assistance   Dressing Assistance: Independent     Functional Limitations Info  Sight, Hearing, Speech Sight Info: Adequate Hearing Info: Adequate Speech Info: Adequate    SPECIAL CARE FACTORS FREQUENCY  PT (By licensed PT), OT (By licensed OT)     PT Frequency: 2-3x per week OT Frequency: 2-3x per week            Contractures Contractures Info: Not present    Additional Factors Info  Code Status, Allergies Code Status Info: FULL Allergies Info: Tetracycline, Candida Albicans, Cephalexin, Yeast (Saccharomyces Cerevisiae), Ciprofloxacin , Fenofibrate, Glycerine (Glycerin), Levofloxacin, Metronidazole , Tetracyclines & Related, Morphine            Current Medications (09/02/2024):  This is the current hospital active medication list Current Facility-Administered Medications  Medication Dose Route Frequency Provider Last Rate Last Admin   0.9 %  sodium chloride  infusion   Intravenous Continuous Vernetta Lonni GRADE, MD   Stopped at 08/29/24 1250   acetaminophen  (TYLENOL ) tablet 325-650 mg  325-650 mg Oral Q6H PRN Vernetta Lonni GRADE, MD   650 mg at 09/02/24 0932   alum & mag hydroxide-simeth (MAALOX/MYLANTA) 200-200-20 MG/5ML suspension 30 mL  30 mL Oral Q4H PRN Vernetta Lonni GRADE, MD   30 mL at 09/01/24 1725   amLODipine  (NORVASC ) tablet 2.5 mg  2.5 mg Oral QPC supper Vernetta Lonni GRADE, MD   2.5 mg at 09/01/24 1725   aspirin  chewable tablet 81 mg  81 mg Oral BID Vernetta Lonni GRADE, MD   81 mg at 09/02/24 [phone]  diphenhydrAMINE  (BENADRYL ) 12.5 MG/5ML elixir 12.5-25 mg  12.5-25 mg Oral Q4H PRN Vernetta Lonni GRADE, MD       docusate sodium  (COLACE) capsule 100 mg  100 mg Oral BID Vernetta Lonni GRADE, MD   100 mg at 09/02/24 9068   irbesartan  (AVAPRO ) tablet 150 mg  150 mg Oral Daily Vernetta Lonni GRADE, MD   150 mg at 09/02/24 9068   And   hydrochlorothiazide  (HYDRODIURIL ) tablet 12.5 mg  12.5 mg  Oral Daily Vernetta Lonni GRADE, MD   12.5 mg at 09/02/24 0931   HYDROcodone -acetaminophen  (NORCO) 7.5-325 MG per tablet 1-2 tablet  1-2 tablet Oral Q4H PRN Vernetta Lonni GRADE, MD   1 tablet at 08/29/24 1731   HYDROcodone -acetaminophen  (NORCO/VICODIN) 5-325 MG per tablet 1-2 tablet  1-2 tablet Oral Q4H PRN Vernetta Lonni GRADE, MD   2 tablet at 09/02/24 0033   HYDROmorphone  (DILAUDID ) injection 0.5-1 mg  0.5-1 mg Intravenous Q3H PRN Vernetta Lonni GRADE, MD   0.5 mg at 09/01/24 0432   menthol  (CEPACOL) lozenge 3 mg  1 lozenge Oral PRN Vernetta Lonni GRADE, MD       Or   phenol (CHLORASEPTIC) mouth spray 1 spray  1 spray Mouth/Throat PRN Vernetta Lonni GRADE, MD       methocarbamol  (ROBAXIN ) tablet 500 mg  500 mg Oral Q6H PRN Vernetta Lonni GRADE, MD   500 mg at 09/02/24 0033   Or   methocarbamol  (ROBAXIN ) injection 500 mg  500 mg Intravenous Q6H PRN Vernetta Lonni GRADE, MD       metoCLOPramide  (REGLAN ) tablet 5-10 mg  5-10 mg Oral Q8H PRN Vernetta Lonni GRADE, MD       Or   metoCLOPramide  (REGLAN ) injection 5-10 mg  5-10 mg Intravenous Q8H PRN Vernetta Lonni GRADE, MD   10 mg at 09/01/24 9183   ondansetron  (ZOFRAN ) tablet 4 mg  4 mg Oral Q6H PRN Vernetta Lonni GRADE, MD       Or   ondansetron  (ZOFRAN ) injection 4 mg  4 mg Intravenous Q6H PRN Vernetta Lonni GRADE, MD   4 mg at 08/28/24 1747   Oral care mouth rinse  15 mL Mouth Rinse PRN Vernetta Lonni GRADE, MD       pantoprazole  (PROTONIX ) EC tablet 40 mg  40 mg Oral Daily Vernetta Lonni GRADE, MD   40 mg at 09/02/24 0931   polyethylene glycol (MIRALAX  / GLYCOLAX ) packet 17 g  17 g Oral Daily PRN Vernetta Lonni GRADE, MD   17 g at 09/02/24 9065   zolpidem  (AMBIEN ) tablet 5 mg  5 mg Oral QHS PRN Vernetta Lonni GRADE, MD         Discharge Medications: Please see discharge summary for a list of discharge medications.  Relevant Imaging Results:  Relevant Lab Results:   Additional  Information SSN: [id number]  Heather LABOR Michelina Mexicano, LCSW     "

## 2024-09-03 ENCOUNTER — Telehealth: Payer: Self-pay | Admitting: Orthopaedic Surgery

## 2024-09-03 ENCOUNTER — Telehealth: Payer: Self-pay

## 2024-09-03 NOTE — Telephone Encounter (Signed)
 Patient called. Says she is bleeding. She LM on the Triage phone hours ago. Would like a call back.

## 2024-09-03 NOTE — Telephone Encounter (Signed)
 Msg sent to [facility].

## 2024-09-03 NOTE — Telephone Encounter (Signed)
 Patient called stating that she is concerned about some blood that she is seeing under her dressing.  Would like a CB at [phone].  Please advise.  Thank you.

## 2024-09-03 NOTE — Telephone Encounter (Signed)
 FYI told her that this was very common and that particular bandage is made to absorb the drainage that she is going to have and patients can see it more  She was more than welcome to go ahead and change it

## 2024-09-10 ENCOUNTER — Encounter: Payer: Self-pay | Admitting: Orthopaedic Surgery

## 2024-09-10 ENCOUNTER — Ambulatory Visit: Admitting: Orthopaedic Surgery

## 2024-09-10 DIAGNOSIS — Z96642 Presence of left artificial hip joint: Secondary | ICD-10-CM

## 2024-09-10 NOTE — Progress Notes (Signed)
 The patient is a 74 year old female who is very active and here today for her first postoperative visit status post a left total hip replacement to treat significant left hip pain and arthritis.  She is ambulating with a walker and doing well.  She is getting therapy.  She is been compliant with a baby aspirin  twice daily.  On exam her left hip incision looks great.  Staples have been removed and Steri-Strips applied.  She will slowly increase her activities as comfort allows and can stop her baby aspirin .  She will continue therapy per her facility and we will see her back in a month to see how she is doing overall.  In a month I would like to assess her leg lengths as well.

## 2024-10-08 ENCOUNTER — Encounter: Admitting: Orthopaedic Surgery
